# Patient Record
Sex: Male | Born: 1940 | Race: White | Hispanic: No | Marital: Married | Smoking: Former smoker
Health system: Southern US, Community
[De-identification: ages and names within clinical notes are randomized; demographics above are authoritative.]

## PROBLEM LIST (undated history)

## (undated) DIAGNOSIS — I499 Cardiac arrhythmia, unspecified: Secondary | ICD-10-CM

## (undated) DIAGNOSIS — K559 Vascular disorder of intestine, unspecified: Secondary | ICD-10-CM

## (undated) DIAGNOSIS — F419 Anxiety disorder, unspecified: Secondary | ICD-10-CM

## (undated) DIAGNOSIS — I509 Heart failure, unspecified: Secondary | ICD-10-CM

## (undated) DIAGNOSIS — I10 Essential (primary) hypertension: Secondary | ICD-10-CM

## (undated) DIAGNOSIS — F40240 Claustrophobia: Secondary | ICD-10-CM

## (undated) DIAGNOSIS — I82409 Acute embolism and thrombosis of unspecified deep veins of unspecified lower extremity: Secondary | ICD-10-CM

## (undated) HISTORY — PX: PACEMAKER INSERTION: SHX728

---

## 2000-11-04 HISTORY — PX: PITUITARY SURGERY: SHX203

## 2004-03-09 ENCOUNTER — Other Ambulatory Visit: Payer: Self-pay

## 2005-04-04 ENCOUNTER — Ambulatory Visit: Payer: Self-pay | Admitting: Gastroenterology

## 2005-11-04 HISTORY — PX: HERNIA REPAIR: SHX51

## 2008-11-04 DIAGNOSIS — K559 Vascular disorder of intestine, unspecified: Secondary | ICD-10-CM

## 2008-11-04 DIAGNOSIS — I82409 Acute embolism and thrombosis of unspecified deep veins of unspecified lower extremity: Secondary | ICD-10-CM

## 2008-11-04 HISTORY — DX: Acute embolism and thrombosis of unspecified deep veins of unspecified lower extremity: I82.409

## 2008-11-04 HISTORY — DX: Vascular disorder of intestine, unspecified: K55.9

## 2010-10-17 ENCOUNTER — Inpatient Hospital Stay: Payer: Self-pay | Admitting: Surgery

## 2010-10-19 LAB — PATHOLOGY REPORT

## 2010-11-04 ENCOUNTER — Ambulatory Visit: Payer: Self-pay | Admitting: Internal Medicine

## 2010-11-19 ENCOUNTER — Inpatient Hospital Stay: Payer: Self-pay | Admitting: Internal Medicine

## 2010-12-05 ENCOUNTER — Ambulatory Visit: Payer: Self-pay | Admitting: Internal Medicine

## 2010-12-15 ENCOUNTER — Observation Stay: Payer: Self-pay | Admitting: Internal Medicine

## 2011-02-25 ENCOUNTER — Ambulatory Visit: Payer: Self-pay | Admitting: Cardiovascular Disease

## 2011-03-01 ENCOUNTER — Ambulatory Visit: Payer: Self-pay | Admitting: Cardiovascular Disease

## 2011-04-16 ENCOUNTER — Ambulatory Visit: Payer: Self-pay | Admitting: Cardiology

## 2011-09-02 ENCOUNTER — Inpatient Hospital Stay: Payer: Self-pay | Admitting: Cardiology

## 2012-04-07 ENCOUNTER — Emergency Department: Payer: Self-pay | Admitting: Emergency Medicine

## 2012-04-07 LAB — COMPREHENSIVE METABOLIC PANEL
Albumin: 3.6 g/dL (ref 3.4–5.0)
Alkaline Phosphatase: 78 U/L (ref 50–136)
Anion Gap: 10 (ref 7–16)
BUN: 28 mg/dL — ABNORMAL HIGH (ref 7–18)
Calcium, Total: 8.6 mg/dL (ref 8.5–10.1)
Co2: 27 mmol/L (ref 21–32)
Creatinine: 1.16 mg/dL (ref 0.60–1.30)
EGFR (African American): >60
EGFR (Non-African Amer.): >60
Osmolality: 289 (ref 275–301)
Potassium: 3.7 mmol/L (ref 3.5–5.1)
SGOT(AST): 29 U/L (ref 15–37)
Total Protein: 6.9 g/dL (ref 6.4–8.2)

## 2012-04-07 LAB — URINALYSIS, COMPLETE
Bilirubin,UR: NEGATIVE
Glucose,UR: NEGATIVE mg/dL (ref 0–75)
Ketone: NEGATIVE
Nitrite: NEGATIVE
Ph: 6 (ref 4.5–8.0)
Protein: NEGATIVE
RBC,UR: 7 /HPF (ref 0–5)
WBC UR: <1 /HPF (ref 0–5)

## 2012-04-07 LAB — CBC
HCT: 45.7 % (ref 40.0–52.0)
HGB: 14.7 g/dL (ref 13.0–18.0)
Platelet: 201 10*3/uL (ref 150–440)
RBC: 5.52 10*6/uL (ref 4.40–5.90)
RDW: 17 % — ABNORMAL HIGH (ref 11.5–14.5)

## 2012-04-07 LAB — TROPONIN I: Troponin-I: <0.02 ng/mL

## 2012-04-07 LAB — CK TOTAL AND CKMB (NOT AT ARMC): CK-MB: 2.5 ng/mL (ref 0.5–3.6)

## 2012-07-10 DIAGNOSIS — N401 Enlarged prostate with lower urinary tract symptoms: Secondary | ICD-10-CM | POA: Diagnosis present

## 2012-11-05 ENCOUNTER — Ambulatory Visit: Payer: Self-pay | Admitting: Radiation Oncology

## 2014-03-03 DIAGNOSIS — I4891 Unspecified atrial fibrillation: Secondary | ICD-10-CM | POA: Diagnosis present

## 2014-03-03 DIAGNOSIS — I480 Paroxysmal atrial fibrillation: Secondary | ICD-10-CM | POA: Diagnosis present

## 2014-05-17 DIAGNOSIS — Z95810 Presence of automatic (implantable) cardiac defibrillator: Secondary | ICD-10-CM | POA: Diagnosis present

## 2014-05-17 DIAGNOSIS — I10 Essential (primary) hypertension: Secondary | ICD-10-CM | POA: Diagnosis present

## 2015-01-07 ENCOUNTER — Observation Stay: Payer: Self-pay | Admitting: Internal Medicine
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
## 2015-09-08 ENCOUNTER — Emergency Department
Admission: EM | Admit: 2015-09-08 | Discharge: 2015-09-08 | Disposition: A | Discharge status: Home / Self Care | Payer: Medicare PPO | Attending: Emergency Medicine | Admitting: Emergency Medicine

## 2015-09-08 ENCOUNTER — Emergency Department: Payer: Medicare PPO

## 2015-09-08 DIAGNOSIS — Z7901 Long term (current) use of anticoagulants: Secondary | ICD-10-CM | POA: Insufficient documentation

## 2015-09-08 DIAGNOSIS — R55 Syncope and collapse: Secondary | ICD-10-CM | POA: Insufficient documentation

## 2015-09-08 DIAGNOSIS — Z79899 Other long term (current) drug therapy: Secondary | ICD-10-CM | POA: Diagnosis not present

## 2015-09-08 DIAGNOSIS — I10 Essential (primary) hypertension: Secondary | ICD-10-CM | POA: Diagnosis not present

## 2015-09-08 HISTORY — DX: Acute embolism and thrombosis of unspecified deep veins of unspecified lower extremity: I82.409

## 2015-09-08 HISTORY — DX: Essential (primary) hypertension: I10

## 2015-09-08 HISTORY — DX: Heart failure, unspecified: I50.9

## 2015-09-08 LAB — COMPREHENSIVE METABOLIC PANEL
ALT: 18 U/L (ref 17–63)
AST: 23 U/L (ref 15–41)
Albumin: 3.5 g/dL (ref 3.5–5.0)
Alkaline Phosphatase: 54 U/L (ref 38–126)
Anion gap: 8 (ref 5–15)
BILIRUBIN TOTAL: 1.9 mg/dL — AB (ref 0.3–1.2)
BUN: 33 mg/dL — AB (ref 6–20)
CO2: 37 mmol/L — ABNORMAL HIGH (ref 22–32)
CREATININE: 1.29 mg/dL — AB (ref 0.61–1.24)
Calcium: 8.9 mg/dL (ref 8.9–10.3)
Chloride: 92 mmol/L — ABNORMAL LOW (ref 101–111)
GFR calc Af Amer: >60 mL/min (ref >60)
GFR calc non Af Amer: 54 mL/min — ABNORMAL LOW (ref >60)
Glucose, Bld: 209 mg/dL — ABNORMAL HIGH (ref 65–99)
Potassium: 2.7 mmol/L — CL (ref 3.5–5.1)
Sodium: 137 mmol/L (ref 135–145)
Total Protein: 6 g/dL — ABNORMAL LOW (ref 6.5–8.1)

## 2015-09-08 LAB — CBC WITH DIFFERENTIAL/PLATELET
BASOS ABS: 0.1 10*3/uL (ref 0–0.1)
Basophils Relative: 1 %
Eosinophils Absolute: 0.2 10*3/uL (ref 0–0.7)
Eosinophils Relative: 2 %
HEMATOCRIT: 48.7 % (ref 40.0–52.0)
HEMOGLOBIN: 16.5 g/dL (ref 13.0–18.0)
LYMPHS ABS: 1.7 10*3/uL (ref 1.0–3.6)
LYMPHS PCT: 20 %
MCH: 29.2 pg (ref 26.0–34.0)
MCHC: 33.9 g/dL (ref 32.0–36.0)
MCV: 86.3 fL (ref 80.0–100.0)
Monocytes Absolute: 1.1 10*3/uL — ABNORMAL HIGH (ref 0.2–1.0)
Monocytes Relative: 14 %
NEUTROS ABS: 5.2 10*3/uL (ref 1.4–6.5)
Neutrophils Relative %: 63 %
Platelets: 181 10*3/uL (ref 150–440)
RBC: 5.64 MIL/uL (ref 4.40–5.90)
RDW: 16.4 % — ABNORMAL HIGH (ref 11.5–14.5)
WBC: 8.3 10*3/uL (ref 3.8–10.6)

## 2015-09-08 LAB — TROPONIN I: TROPONIN I: 0.04 ng/mL — AB (ref <0.031)

## 2015-09-08 LAB — FIBRIN DERIVATIVES D-DIMER (ARMC ONLY): FIBRIN DERIVATIVES D-DIMER (ARMC): 679 — AB (ref 0–499)

## 2015-09-08 MED ORDER — POTASSIUM CHLORIDE CRYS ER 20 MEQ PO TBCR
20.0000 meq | EXTENDED_RELEASE_TABLET | Freq: Once | ORAL | Status: AC
  Administered 2015-09-08: 20 meq via ORAL
  Filled 2015-09-08: qty 1

## 2015-09-08 MED ORDER — IOHEXOL 350 MG/ML SOLN
80.0000 mL | Freq: Once | INTRAVENOUS | Status: AC | PRN
  Administered 2015-09-08: 80 mL via INTRAVENOUS

## 2015-09-08 NOTE — ED Notes (Signed)
MD notified of criticals
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
## 2015-09-08 NOTE — Discharge Instructions (Signed)
Near-Syncope °Near-syncope (commonly known as near fainting) is sudden weakness, dizziness, or feeling like you might pass out. During an episode of near-syncope, you may also develop pale skin, have tunnel vision, or feel sick to your stomach (nauseous). Near-syncope may occur when getting up after sitting or while standing for a long time. It is caused by a sudden decrease in blood flow to the brain. This decrease can result from various causes or triggers, most of which are not serious. However, because near-syncope can sometimes be a sign of something serious, a medical evaluation is required. The specific cause is often not determined. °HOME CARE INSTRUCTIONS  °Monitor your condition for any changes. The following actions may help to alleviate any discomfort you are experiencing: °· Have someone stay with you until you feel stable. °· Lie down right away and prop your feet up if you start feeling like you might faint. Breathe deeply and steadily. Wait until all the symptoms have passed. Most of these episodes last only a few minutes. You may feel tired for several hours.   °· Drink enough fluids to keep your urine clear or pale yellow.   °· If you are taking blood pressure or heart medicine, get up slowly when seated or lying down. Take several minutes to sit and then stand. This can reduce dizziness. °· Follow up with your health care provider as directed.  °SEEK IMMEDIATE MEDICAL CARE IF:  °· You have a severe headache.   °· You have unusual pain in the chest, abdomen, or back.   °· You are bleeding from the mouth or rectum, or you have black or tarry stool.   °· You have an irregular or very fast heartbeat.   °· You have repeated fainting or have seizure-like jerking during an episode.   °· You faint when sitting or lying down.   °· You have confusion.   °· You have difficulty walking.   °· You have severe weakness.   °· You have vision problems.   °MAKE SURE YOU:  °· Understand these instructions. °· Will  watch your condition. °· Will get help right away if you are not doing well or get worse. °  °This information is not intended to replace advice given to you by your health care provider. Make sure you discuss any questions you have with your health care provider. °  °Document Released: 10/21/2005 Document Revised: 10/26/2013 Document Reviewed: 03/26/2013 °Elsevier Interactive Patient Education ©2016 [employer] ° °Please return immediately if condition worsens. Please contact her primary physician or the physician you were given for referral. If you have any specialist physicians involved in her treatment and plan please also contact them. Thank you for using [facility]. ° °

## 2015-09-08 NOTE — ED Provider Notes (Signed)
Time Seen: Approximately 1042  I have reviewed the triage notes  Chief Complaint: Near Syncope   History of Present Illness: Adrian Chavez is a 73 y.o. male who states he is working out with a client at the gym and apparently had what he describes as a near syncopal episode. Patient has a history of blood clots as recently had his PT/INR time checked 2 days ago and it was therapeutic at 2.0. Patient denies any chest pain or focal weakness. He states currently he feels back to baseline. Bystanders said that he had loss consciousness but the patient states he "" phased out "". He denies any leg pain or swelling, shortness of breath, nausea or vomiting, arm and jaw or neck discomfort. Patient states he's been taking slightly extra of his diuretic medication. He is also been finishing up with a prednisone taper   Past Medical History  Diagnosis Date  . CHF (congestive heart failure) ([place])   . DVT (deep venous thrombosis) ([place])   . Hypertension     There are no active problems to display for this patient.   Past Surgical History  Procedure Laterality Date  . Pacemaker insertion      Past Surgical History  Procedure Laterality Date  . Pacemaker insertion      Current Outpatient Rx  Name  Route  Sig  Dispense  Refill  . ALPRAZolam (XANAX) 0.5 MG tablet   Oral   Take 0.5 mg by mouth daily as needed for anxiety.          . carvedilol (COREG) 3.125 MG tablet   Oral   Take 3.125 mg by mouth 2 (two) times daily.         Marland Kitchen CIALIS 5 MG tablet   Oral   Take 2.5 mg by mouth daily.           Dispense as written.   . metolazone (ZAROXOLYN) 5 MG tablet   Oral   Take 5 mg by mouth daily as needed (for swelling).         . potassium chloride SA (K-DUR,KLOR-CON) 20 MEQ tablet   Oral   Take 20 mEq by mouth 2 (two) times daily.         . predniSONE (DELTASONE) 10 MG tablet   Oral   Take 10-40 mg by mouth See admin instructions. 4 tablets daily for 3 days, then 3 tablets  daily for 3 days, then 2 tablets daily for 3 days, then 1 tablet daily for 3 days, then stop         . torsemide (DEMADEX) 20 MG tablet   Oral   Take 20 mg by mouth 2 (two) times daily.         Marland Kitchen warfarin (COUMADIN) 1 MG tablet   Oral   Take 2 mg by mouth at bedtime.           Allergies:  Oxycodone  Family History: No family history on file.  Social History: Social History  Substance Use Topics  . Smoking status: Never Smoker   . Smokeless tobacco: None  . Alcohol Use: No     Review of Systems:   10 point review of systems was performed and was otherwise negative:  Constitutional: No fever Eyes: No visual disturbances ENT: No sore throat, ear pain Cardiac: No chest pain Respiratory: No shortness of breath, wheezing, or stridor Abdomen: No abdominal pain, no vomiting, No diarrhea Endocrine: No weight loss, No night sweats Extremities: No peripheral  edema, cyanosis Skin: No rashes, easy bruising Neurologic: No focal weakness, trouble with speech or swollowing Urologic: No dysuria, Hematuria, or urinary frequency   Physical Exam:  ED Triage Vitals  Enc Vitals Group     BP 09/08/15 1039 98/72 mmHg     Pulse Rate 09/08/15 1039 80     Resp 09/08/15 1039 16     Temp 09/08/15 1039 97.6 F (36.4 C)     Temp Source 09/08/15 1039 Oral     SpO2 09/08/15 1039 97 %     Weight 09/08/15 1039 173 lb (78.472 kg)     Height 09/08/15 1039 5\' 8"  (1.727 m)     Head Cir --      Peak Flow --      Pain Score --      Pain Loc --      Pain Edu? --      Excl. in [place]? --     General: Awake , Alert , and Oriented times 3; GCS 15 Head: Normal cephalic , atraumatic Eyes: Pupils equal , round, reactive to light Nose/Throat: No nasal drainage, patent upper airway without erythema or exudate.  Neck: Supple, Full range of motion, No anterior adenopathy or palpable thyroid masses Lungs: Clear to ascultation without wheezes , rhonchi, or rales Heart: Regular rate, regular rhythm  without murmurs , gallops , or rubs Abdomen: Soft, non tender without rebound, guarding , or rigidity; bowel sounds positive and symmetric in all 4 quadrants. No organomegaly .        Extremities: 2 plus symmetric pulses. No edema, clubbing or cyanosis Neurologic: normal ambulation, Motor symmetric without deficits, sensory intact Skin: warm, dry, no rashes   Labs:   All laboratory work was reviewed including any pertinent negatives or positives listed below:  Labs Reviewed  CBC WITH DIFFERENTIAL/PLATELET - Abnormal; Notable for the following:    RDW 16.4 (*)    Monocytes Absolute 1.1 (*)    All other components within normal limits  COMPREHENSIVE METABOLIC PANEL - Abnormal; Notable for the following:    Potassium 2.7 (*)    Chloride 92 (*)    CO2 37 (*)    Glucose, Bld 209 (*)    BUN 33 (*)    Creatinine, Ser 1.29 (*)    Total Protein 6.0 (*)    Total Bilirubin 1.9 (*)    GFR calc non Af Amer 54 (*)    All other components within normal limits  TROPONIN I - Abnormal; Notable for the following:    Troponin I 0.04 (*)    All other components within normal limits  FIBRIN DERIVATIVES D-DIMER ([facility] ONLY) - Abnormal; Notable for the following:    Fibrin derivatives D-dimer ([facility]) 679 (*)    All other components within normal limits   laboratory work shows slightly elevated D-dimer test, troponin is overall within normal limits. Patient also has hypokalemia. He has some mild renal insufficiency for him.  EKG: ED ECG REPORT I, Daymon Larsen, the attending physician, personally viewed and interpreted this ECG.  Date: 09/08/2015 EKG Time: 1037 Rate: 76 Rhythm: Paced with frequent PVCs QRS Axis: normal Intervals: normal ST/T Wave abnormalities: normal Conduction Disutrbances: none Narrative Interpretation: unremarkable Unremarkable given history of pacemaker  Radiology:      EXAM: CT ANGIOGRAPHY CHEST WITH CONTRAST  TECHNIQUE: Multidetector CT imaging of the chest was  performed using the standard protocol during bolus administration of intravenous contrast. Multiplanar CT image reconstructions and MIPs were obtained to evaluate  the vascular anatomy.  CONTRAST: 67mL OMNIPAQUE IOHEXOL 350 MG/ML IV.  COMPARISON: 11/19/2010.  FINDINGS: Technical quality: Very good.  Pulmonary embolism: Absent.  Cardiovascular: Heart enlarged with marked left ventricular enlargement and marked left atrial enlargement. Biventricular pacing defibrillator with the lead tips at the right atrial appendage, right ventricular apex and left coronary vein. Severe LAD and right coronary atherosclerosis. No pericardial effusion. Moderate atherosclerosis involving the thoracic and upper abdominal aorta without aneurysm.  Mediastinum/Lymph Nodes: No pathologically enlarged mediastinal, hilar or axillary lymph nodes. No mediastinal masses. Normal-appearing esophagus.  Lungs/Pleura: Emphysematous changes throughout both lungs. Calcified granulomata throughout both lungs. Noncalcified pleural-based 4 mm nodule in the lateral left upper lobe (series 6, images 57), not visualized on the prior examination. No noncalcified nodules elsewhere. Scarring in the lower lobes. Bronchiectasis involving the lower lobes, right greater than left. No confluent airspace consolidation. No evidence of interstitial pulmonary edema. Central airways patent with mild bronchial wall thickening. No pleural effusions. No pleural masses. He  Upper abdomen: Simple cyst involving the left lobe of the liver. Focus of accessory splenic tissue medial to the lower pole of the spleen. No acute or significant abnormality involving the visualized upper abdomen.  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 Exaggeration of the usual thoracic kyphosis.  Review of the MIP images confirms the above findings.  IMPRESSION: 1. No evidence of pulmonary  embolism. 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 3. 4 mm pleural-based nodule laterally in the left upper lobe, not clearly visualized in 2012. Statistically this represents a subpleural lymph node. Given risk factors for bronchogenic carcinoma, follow-up chest CT at 1 year is recommended. This recommendation follows the consensus statement: Guidelines for Management of Small Pulmonary Nodules Detected on CT Scans: A Statement from the [place] as published in Radiology 2005; 237:395-400. 4. Bronchiectasis involving the lower lobes, right greater than left. 5. Marked cardiomegaly with marked left ventricular enlargement and left atrial enlargement. No evidence of pulmonary edema.   Electronically Signed By: Evangeline Dakin M.D. On: 09/08/2015 14:29          DG Chest 2 View (Final result) Result time: 09/08/15 11:20:03   Final result by Rad Results In Interface (09/08/15 11:20:03)   Narrative:   CLINICAL DATA: Weakness, dizziness and near syncope. Diaphoretic.  EXAM: CHEST - 2 VIEW  COMPARISON: 01/07/2015  FINDINGS: Stable significant cardiac enlargement and stable appearance of a biventricular pacing/ICD device. There is no evidence of pulmonary edema, consolidation, pneumothorax, nodule or pleural fluid. The thoracic spine demonstrates stable spondylosis and osteopenia.  IMPRESSION: Stable significant cardiomegaly and radiographic appearance of biventricular pacing/ICD device. No pulmonary edema identified.   Electronically Signed By: Aletta Edouard M.D. On: 09/08/2015 11:20             I personally reviewed the radiologic studies    ED Course:  Patient was placed on a continuous cardiac monitor and otherwise felt fine at this point. I'm not sure the exact cause of his syncopal or near syncopal episode however does not appear to be obviously cardiogenic at this time. Patient  states he feels symptomatically improved and wishes to go home. He was advised to avoid his diuretic therapy over at least the next 24 hours. Skin advised drink plenty of fluids and was given food and fluid here in emergency department without difficulty    Assessment:  Near syncope   Final Clinical Impression:  Final diagnoses:  Near syncope  Plan:  Patient was advised to return immediately if condition worsens. Patient was advised to follow up with her primary care physician or other specialized physicians involved and in their current assessment.             Daymon Larsen, MD 09/08/15 [phone]

## 2015-09-08 NOTE — ED Notes (Signed)
Pt informed to return if any life threatening symptoms occur.  Pt alert and oriented X4, active, cooperative, pt in NAD. RR even and unlabored, color WNL.   

## 2015-09-08 NOTE — ED Notes (Signed)
Pt color in skin now appropriate, skin warm and dry.

## 2015-11-27 DIAGNOSIS — M9901 Segmental and somatic dysfunction of cervical region: Secondary | ICD-10-CM | POA: Diagnosis not present

## 2015-11-27 DIAGNOSIS — M50322 Other cervical disc degeneration at C5-C6 level: Secondary | ICD-10-CM | POA: Diagnosis not present

## 2015-11-27 DIAGNOSIS — M50321 Other cervical disc degeneration at C4-C5 level: Secondary | ICD-10-CM | POA: Diagnosis not present

## 2015-11-27 DIAGNOSIS — M50323 Other cervical disc degeneration at C6-C7 level: Secondary | ICD-10-CM | POA: Diagnosis not present

## 2015-11-30 DIAGNOSIS — I480 Paroxysmal atrial fibrillation: Secondary | ICD-10-CM | POA: Diagnosis not present

## 2015-11-30 DIAGNOSIS — M50321 Other cervical disc degeneration at C4-C5 level: Secondary | ICD-10-CM | POA: Diagnosis not present

## 2015-11-30 DIAGNOSIS — M50323 Other cervical disc degeneration at C6-C7 level: Secondary | ICD-10-CM | POA: Diagnosis not present

## 2015-11-30 DIAGNOSIS — M9901 Segmental and somatic dysfunction of cervical region: Secondary | ICD-10-CM | POA: Diagnosis not present

## 2015-11-30 DIAGNOSIS — M50322 Other cervical disc degeneration at C5-C6 level: Secondary | ICD-10-CM | POA: Diagnosis not present

## 2015-12-01 DIAGNOSIS — M542 Cervicalgia: Secondary | ICD-10-CM | POA: Diagnosis not present

## 2015-12-07 DIAGNOSIS — Z79899 Other long term (current) drug therapy: Secondary | ICD-10-CM | POA: Diagnosis not present

## 2015-12-14 DIAGNOSIS — Z8739 Personal history of other diseases of the musculoskeletal system and connective tissue: Secondary | ICD-10-CM | POA: Diagnosis not present

## 2015-12-14 DIAGNOSIS — I5022 Chronic systolic (congestive) heart failure: Secondary | ICD-10-CM | POA: Diagnosis not present

## 2015-12-14 DIAGNOSIS — I4891 Unspecified atrial fibrillation: Secondary | ICD-10-CM | POA: Diagnosis not present

## 2015-12-14 DIAGNOSIS — I482 Chronic atrial fibrillation: Secondary | ICD-10-CM | POA: Diagnosis not present

## 2015-12-14 DIAGNOSIS — I10 Essential (primary) hypertension: Secondary | ICD-10-CM | POA: Diagnosis not present

## 2015-12-20 DIAGNOSIS — I5022 Chronic systolic (congestive) heart failure: Secondary | ICD-10-CM | POA: Diagnosis not present

## 2015-12-20 DIAGNOSIS — Z95810 Presence of automatic (implantable) cardiac defibrillator: Secondary | ICD-10-CM | POA: Diagnosis not present

## 2015-12-20 DIAGNOSIS — I420 Dilated cardiomyopathy: Secondary | ICD-10-CM | POA: Diagnosis not present

## 2015-12-20 DIAGNOSIS — I480 Paroxysmal atrial fibrillation: Secondary | ICD-10-CM | POA: Diagnosis not present

## 2015-12-20 DIAGNOSIS — I10 Essential (primary) hypertension: Secondary | ICD-10-CM | POA: Diagnosis not present

## 2016-01-04 DIAGNOSIS — M791 Myalgia: Secondary | ICD-10-CM | POA: Diagnosis not present

## 2016-01-04 DIAGNOSIS — R972 Elevated prostate specific antigen [PSA]: Secondary | ICD-10-CM | POA: Diagnosis not present

## 2016-01-11 DIAGNOSIS — I4891 Unspecified atrial fibrillation: Secondary | ICD-10-CM | POA: Diagnosis not present

## 2016-01-16 DIAGNOSIS — I482 Chronic atrial fibrillation: Secondary | ICD-10-CM | POA: Diagnosis not present

## 2016-01-17 DIAGNOSIS — M1A00X0 Idiopathic chronic gout, unspecified site, without tophus (tophi): Secondary | ICD-10-CM | POA: Diagnosis not present

## 2016-01-17 DIAGNOSIS — R700 Elevated erythrocyte sedimentation rate: Secondary | ICD-10-CM | POA: Diagnosis not present

## 2016-01-17 DIAGNOSIS — M25511 Pain in right shoulder: Secondary | ICD-10-CM | POA: Diagnosis not present

## 2016-01-17 DIAGNOSIS — G8929 Other chronic pain: Secondary | ICD-10-CM | POA: Diagnosis not present

## 2016-01-17 DIAGNOSIS — M353 Polymyalgia rheumatica: Secondary | ICD-10-CM | POA: Diagnosis not present

## 2016-01-17 DIAGNOSIS — M25551 Pain in right hip: Secondary | ICD-10-CM | POA: Diagnosis not present

## 2016-01-17 DIAGNOSIS — M25512 Pain in left shoulder: Secondary | ICD-10-CM | POA: Diagnosis not present

## 2016-01-29 DIAGNOSIS — I10 Essential (primary) hypertension: Secondary | ICD-10-CM | POA: Diagnosis not present

## 2016-01-29 DIAGNOSIS — I5022 Chronic systolic (congestive) heart failure: Secondary | ICD-10-CM | POA: Diagnosis not present

## 2016-01-29 DIAGNOSIS — Z95810 Presence of automatic (implantable) cardiac defibrillator: Secondary | ICD-10-CM | POA: Diagnosis not present

## 2016-01-29 DIAGNOSIS — I420 Dilated cardiomyopathy: Secondary | ICD-10-CM | POA: Diagnosis not present

## 2016-01-29 DIAGNOSIS — I480 Paroxysmal atrial fibrillation: Secondary | ICD-10-CM | POA: Diagnosis not present

## 2016-01-31 DIAGNOSIS — R700 Elevated erythrocyte sedimentation rate: Secondary | ICD-10-CM | POA: Diagnosis not present

## 2016-01-31 DIAGNOSIS — M25551 Pain in right hip: Secondary | ICD-10-CM | POA: Diagnosis not present

## 2016-01-31 DIAGNOSIS — M25512 Pain in left shoulder: Secondary | ICD-10-CM | POA: Diagnosis not present

## 2016-01-31 DIAGNOSIS — G8929 Other chronic pain: Secondary | ICD-10-CM | POA: Diagnosis not present

## 2016-01-31 DIAGNOSIS — M25511 Pain in right shoulder: Secondary | ICD-10-CM | POA: Diagnosis not present

## 2016-02-08 DIAGNOSIS — I4891 Unspecified atrial fibrillation: Secondary | ICD-10-CM | POA: Diagnosis not present

## 2016-02-08 DIAGNOSIS — I480 Paroxysmal atrial fibrillation: Secondary | ICD-10-CM | POA: Diagnosis not present

## 2016-02-14 ENCOUNTER — Ambulatory Visit
Admission: RE | Admit: 2016-02-14 | Discharge: 2016-02-14 | Disposition: A | Discharge status: Home / Self Care | Payer: PPO | Source: Ambulatory Visit | Attending: Cardiology | Admitting: Cardiology

## 2016-02-14 ENCOUNTER — Ambulatory Visit: Admission: RE | Admit: 2016-02-14 | Payer: PPO | Source: Ambulatory Visit

## 2016-02-14 ENCOUNTER — Encounter
Admission: RE | Admit: 2016-02-14 | Discharge: 2016-02-14 | Disposition: A | Discharge status: Home / Self Care | Payer: PPO | Source: Ambulatory Visit | Attending: Cardiology | Admitting: Cardiology

## 2016-02-14 DIAGNOSIS — I509 Heart failure, unspecified: Secondary | ICD-10-CM

## 2016-02-14 DIAGNOSIS — I517 Cardiomegaly: Secondary | ICD-10-CM | POA: Diagnosis not present

## 2016-02-14 DIAGNOSIS — Z01818 Encounter for other preprocedural examination: Secondary | ICD-10-CM | POA: Diagnosis not present

## 2016-02-14 DIAGNOSIS — Z95810 Presence of automatic (implantable) cardiac defibrillator: Secondary | ICD-10-CM | POA: Diagnosis not present

## 2016-02-14 DIAGNOSIS — I10 Essential (primary) hypertension: Secondary | ICD-10-CM | POA: Insufficient documentation

## 2016-02-14 DIAGNOSIS — I4891 Unspecified atrial fibrillation: Secondary | ICD-10-CM | POA: Insufficient documentation

## 2016-02-14 HISTORY — DX: Vascular disorder of intestine, unspecified: K55.9

## 2016-02-14 HISTORY — DX: Anxiety disorder, unspecified: F41.9

## 2016-02-14 HISTORY — DX: Cardiac arrhythmia, unspecified: I49.9

## 2016-02-14 HISTORY — DX: Claustrophobia: F40.240

## 2016-02-14 LAB — CBC
HCT: 45.3 % (ref 40.0–52.0)
HEMOGLOBIN: 15.3 g/dL (ref 13.0–18.0)
MCH: 28.9 pg (ref 26.0–34.0)
MCHC: 33.8 g/dL (ref 32.0–36.0)
MCV: 85.5 fL (ref 80.0–100.0)
Platelets: 176 10*3/uL (ref 150–440)
RBC: 5.3 MIL/uL (ref 4.40–5.90)
RDW: 16.7 % — ABNORMAL HIGH (ref 11.5–14.5)
WBC: 6.9 10*3/uL (ref 3.8–10.6)

## 2016-02-14 LAB — BASIC METABOLIC PANEL
ANION GAP: 5 (ref 5–15)
BUN: 24 mg/dL — ABNORMAL HIGH (ref 6–20)
CALCIUM: 9.3 mg/dL (ref 8.9–10.3)
CO2: 31 mmol/L (ref 22–32)
Chloride: 102 mmol/L (ref 101–111)
Creatinine, Ser: 1.03 mg/dL (ref 0.61–1.24)
Glucose, Bld: 100 mg/dL — ABNORMAL HIGH (ref 65–99)
POTASSIUM: 3.3 mmol/L — AB (ref 3.5–5.1)
SODIUM: 138 mmol/L (ref 135–145)

## 2016-02-14 LAB — SURGICAL PCR SCREEN
MRSA, PCR: NEGATIVE
Staphylococcus aureus: POSITIVE — AB

## 2016-02-14 LAB — DIFFERENTIAL
BASOS ABS: 0 10*3/uL (ref 0–0.1)
BASOS PCT: 1 %
Eosinophils Absolute: 0.1 10*3/uL (ref 0–0.7)
Eosinophils Relative: 2 %
Lymphocytes Relative: 13 %
Lymphs Abs: 0.9 10*3/uL — ABNORMAL LOW (ref 1.0–3.6)
Monocytes Absolute: 0.7 10*3/uL (ref 0.2–1.0)
Monocytes Relative: 11 %
NEUTROS ABS: 5 10*3/uL (ref 1.4–6.5)
NEUTROS PCT: 73 %

## 2016-02-14 LAB — PROTIME-INR
INR: 1.35
PROTHROMBIN TIME: 16.8 s — AB (ref 11.4–15.0)

## 2016-02-14 LAB — APTT: APTT: 28 s (ref 24–36)

## 2016-02-14 NOTE — Pre-Procedure Instructions (Signed)
Spoke with Gwinda Passe at Dr. Saralyn Pilar office and she did confirm the lab results that were faxed a few minutes ago have been received and will be shown to Dr. Saralyn Pilar for review.

## 2016-02-14 NOTE — Patient Instructions (Signed)
  Your procedure is scheduled on:Wednesday April 19 , 2017. Report to Same Day Surgery. To find out your arrival time please call [phone] between 1PM - 3PM on Tuesday February 20, 2016.  Remember: Instructions that are not followed completely may result in serious medical risk, up to and including death, or upon the discretion of your surgeon and anesthesiologist your surgery may need to be rescheduled.    _x___ 1. Do not eat food or drink liquids after midnight. No gum chewing or hard candies.     ____ 2. No Alcohol for 24 hours before or after surgery.   ____ 3. Bring all medications with you on the day of surgery if instructed.    __x__ 4. Notify your doctor if there is any change in your medical condition     (cold, fever, infections).     Do not wear jewelry, make-up, hairpins, clips or nail polish.  Do not wear lotions, powders, or perfumes. You may wear deodorant.  Do not shave 48 hours prior to surgery. Men may shave face and neck.  Do not bring valuables to the hospital.    [facility] is not responsible for any belongings or valuables.               Contacts, dentures or bridgework may not be worn into surgery.  Leave your suitcase in the car. After surgery it may be brought to your room.  For patients admitted to the hospital, discharge time is determined by your treatment team.   Patients discharged the day of surgery will not be allowed to drive home.    Please read over the following fact sheets that you were given:   [facility] Preparing for Surgery  __x__ Take these medicines the morning of surgery with A SIP OF WATER:    1. carvedilol (COREG)  2. predniSONE (DELTASONE)    ____ Fleet Enema (as directed)   __x__ Use CHG Soap as directed on instruction sheet  ____ Use inhalers on the day of surgery and bring to hospital day of surgery  ____ Stop metformin 2 days prior to surgery    ____ Take 1/2 of usual insulin dose the night before surgery and none on  the morning of surgery.   ____ Stop Coumadin on February 15, 2016 per Dr. Saralyn Pilar instructions.  _x___ Stop Anti-inflammatories such as Advil, Aleve, Ibuprofen, Motrin, Naproxen, Naprosyn, Goodies powders or aspirin products.OK to take Tylenol.   ____ Stop supplements until after surgery.    ____ Bring C-Pap to the hospital.

## 2016-02-14 NOTE — Pre-Procedure Instructions (Signed)
Abnormal lab results: potassium, PT and positive Staph Aureus nasal swab faxed to Dr. Saralyn Pilar for review.

## 2016-02-15 DIAGNOSIS — I4891 Unspecified atrial fibrillation: Secondary | ICD-10-CM | POA: Diagnosis not present

## 2016-02-21 ENCOUNTER — Ambulatory Visit: Payer: PPO | Admitting: Anesthesiology

## 2016-02-21 ENCOUNTER — Encounter
Admission: RE | Disposition: A | Discharge status: Home / Self Care | Payer: Self-pay | Source: Ambulatory Visit | Attending: Cardiology

## 2016-02-21 ENCOUNTER — Ambulatory Visit
Admission: RE | Admit: 2016-02-21 | Discharge: 2016-02-21 | Disposition: A | Discharge status: Home / Self Care | Payer: PPO | Source: Ambulatory Visit | Attending: Cardiology | Admitting: Cardiology

## 2016-02-21 ENCOUNTER — Encounter: Payer: Self-pay | Admitting: *Deleted

## 2016-02-21 DIAGNOSIS — I5022 Chronic systolic (congestive) heart failure: Secondary | ICD-10-CM | POA: Diagnosis not present

## 2016-02-21 DIAGNOSIS — Z4502 Encounter for adjustment and management of automatic implantable cardiac defibrillator: Secondary | ICD-10-CM | POA: Diagnosis not present

## 2016-02-21 DIAGNOSIS — I420 Dilated cardiomyopathy: Secondary | ICD-10-CM | POA: Insufficient documentation

## 2016-02-21 DIAGNOSIS — I480 Paroxysmal atrial fibrillation: Secondary | ICD-10-CM | POA: Diagnosis not present

## 2016-02-21 DIAGNOSIS — N400 Enlarged prostate without lower urinary tract symptoms: Secondary | ICD-10-CM | POA: Insufficient documentation

## 2016-02-21 DIAGNOSIS — I340 Nonrheumatic mitral (valve) insufficiency: Secondary | ICD-10-CM | POA: Diagnosis not present

## 2016-02-21 DIAGNOSIS — Z45018 Encounter for adjustment and management of other part of cardiac pacemaker: Secondary | ICD-10-CM | POA: Diagnosis not present

## 2016-02-21 DIAGNOSIS — Z7901 Long term (current) use of anticoagulants: Secondary | ICD-10-CM | POA: Insufficient documentation

## 2016-02-21 DIAGNOSIS — I509 Heart failure, unspecified: Secondary | ICD-10-CM | POA: Diagnosis not present

## 2016-02-21 DIAGNOSIS — Z87891 Personal history of nicotine dependence: Secondary | ICD-10-CM | POA: Insufficient documentation

## 2016-02-21 DIAGNOSIS — F419 Anxiety disorder, unspecified: Secondary | ICD-10-CM | POA: Insufficient documentation

## 2016-02-21 DIAGNOSIS — I110 Hypertensive heart disease with heart failure: Secondary | ICD-10-CM | POA: Diagnosis not present

## 2016-02-21 DIAGNOSIS — R040 Epistaxis: Secondary | ICD-10-CM | POA: Diagnosis not present

## 2016-02-21 DIAGNOSIS — I10 Essential (primary) hypertension: Secondary | ICD-10-CM | POA: Diagnosis not present

## 2016-02-21 DIAGNOSIS — Z86711 Personal history of pulmonary embolism: Secondary | ICD-10-CM | POA: Insufficient documentation

## 2016-02-21 DIAGNOSIS — I429 Cardiomyopathy, unspecified: Secondary | ICD-10-CM | POA: Diagnosis not present

## 2016-02-21 DIAGNOSIS — I482 Chronic atrial fibrillation: Secondary | ICD-10-CM | POA: Diagnosis not present

## 2016-02-21 HISTORY — PX: IMPLANTABLE CARDIOVERTER DEFIBRILLATOR (ICD) GENERATOR CHANGE: SHX5469

## 2016-02-21 SURGERY — ICD GENERATOR CHANGE
Anesthesia: General | Site: Shoulder | Laterality: Left | Wound class: Clean

## 2016-02-21 MED ORDER — PROPOFOL 10 MG/ML IV BOLUS
INTRAVENOUS | Status: DC | PRN
  Administered 2016-02-21: 20 mg via INTRAVENOUS

## 2016-02-21 MED ORDER — FAMOTIDINE 20 MG PO TABS
ORAL_TABLET | ORAL | Status: AC
  Filled 2016-02-21: qty 1

## 2016-02-21 MED ORDER — SODIUM CHLORIDE 0.9 % IR SOLN
Freq: Once | Status: AC
  Administered 2016-02-21: 100 mL
  Filled 2016-02-21: qty 2

## 2016-02-21 MED ORDER — ONDANSETRON HCL 4 MG/2ML IJ SOLN: 4.0000 mg | Freq: Once | INTRAMUSCULAR | Status: DC | PRN

## 2016-02-21 MED ORDER — CEPHALEXIN 250 MG PO CAPS
250.0000 mg | ORAL_CAPSULE | Freq: Four times a day (QID) | ORAL | Status: DC
Start: 2016-02-21 — End: 2018-08-10

## 2016-02-21 MED ORDER — KETAMINE HCL 50 MG/ML IJ SOLN
INTRAMUSCULAR | Status: DC | PRN
Start: 2016-02-21 — End: 2016-02-21
  Administered 2016-02-21 (×2): 25 mg via INTRAMUSCULAR

## 2016-02-21 MED ORDER — FENTANYL CITRATE (PF) 100 MCG/2ML IJ SOLN
INTRAMUSCULAR | Status: DC | PRN
  Administered 2016-02-21: 50 ug via INTRAVENOUS

## 2016-02-21 MED ORDER — FAMOTIDINE 20 MG PO TABS
20.0000 mg | ORAL_TABLET | Freq: Once | ORAL | Status: AC
  Administered 2016-02-21: 20 mg via ORAL

## 2016-02-21 MED ORDER — LIDOCAINE 1 % OPTIME INJ - NO CHARGE
INTRAMUSCULAR | Status: DC | PRN
  Administered 2016-02-21: 20 mL

## 2016-02-21 MED ORDER — CEFAZOLIN SODIUM 1-5 GM-% IV SOLN
1.0000 g | Freq: Once | INTRAVENOUS | Status: AC
  Administered 2016-02-21: 1 g via INTRAVENOUS

## 2016-02-21 MED ORDER — FENTANYL CITRATE (PF) 100 MCG/2ML IJ SOLN: 25.0000 ug | INTRAMUSCULAR | Status: DC | PRN

## 2016-02-21 MED ORDER — PHENYLEPHRINE HCL 10 MG/ML IJ SOLN
INTRAMUSCULAR | Status: DC | PRN
  Administered 2016-02-21 (×6): 100 ug via INTRAVENOUS

## 2016-02-21 MED ORDER — CEFAZOLIN SODIUM 1-5 GM-% IV SOLN
INTRAVENOUS | Status: AC
  Filled 2016-02-21: qty 50

## 2016-02-21 MED ORDER — HEPARIN SODIUM (PORCINE) 5000 UNIT/ML IJ SOLN
INTRAMUSCULAR | Status: AC
  Filled 2016-02-21: qty 1

## 2016-02-21 MED ORDER — MIDAZOLAM HCL 2 MG/2ML IJ SOLN
INTRAMUSCULAR | Status: DC | PRN
  Administered 2016-02-21: 2 mg via INTRAVENOUS

## 2016-02-21 MED ORDER — LACTATED RINGERS IV SOLN
INTRAVENOUS | Status: DC
  Administered 2016-02-21: 11:00:00 via INTRAVENOUS

## 2016-02-21 MED ORDER — PROPOFOL 500 MG/50ML IV EMUL
INTRAVENOUS | Status: DC | PRN
  Administered 2016-02-21: 50 ug/kg/min via INTRAVENOUS

## 2016-02-21 MED ORDER — GENTAMICIN SULFATE 40 MG/ML IJ SOLN
INTRAMUSCULAR | Status: AC
  Filled 2016-02-21: qty 2

## 2016-02-21 SURGICAL SUPPLY — 46 items
AMPLIA MRI CRT-DFI (Pacemaker) ×3 IMPLANT
BAG DECANTER FOR FLEXI CONT (MISCELLANEOUS) ×6 IMPLANT
BLADE CLIPPER SURG (BLADE) ×3 IMPLANT
BLADE SURG SZ10 CARB STEEL (BLADE) ×3 IMPLANT
CABLE SURG 12 DISP A/V CHANNEL (MISCELLANEOUS) ×3 IMPLANT
CANISTER SUCT 1200ML W/VALVE (MISCELLANEOUS) ×3 IMPLANT
CHLORAPREP W/TINT 26ML (MISCELLANEOUS) ×3 IMPLANT
CLOSURE WOUND 1/2 X4 (GAUZE/BANDAGES/DRESSINGS) ×1
COVER LIGHT HANDLE STERIS (MISCELLANEOUS) ×6 IMPLANT
DEVICE DISSECT PLASMABLAD 3.0S (MISCELLANEOUS) ×1 IMPLANT
DRAPE C-ARM XRAY 36X54 (DRAPES) IMPLANT
DRAPE INCISE IOBAN 66X45 STRL (DRAPES) ×3 IMPLANT
DRAPE LAPAROTOMY 77X122 PED (DRAPES) ×3 IMPLANT
DRSG TEGADERM 4X4.75 (GAUZE/BANDAGES/DRESSINGS) ×3 IMPLANT
DRSG TEGADERM 6X8 (GAUZE/BANDAGES/DRESSINGS) ×3 IMPLANT
ELECT REM PT RETURN 9FT ADLT (ELECTROSURGICAL) ×3
ELECTRODE REM PT RTRN 9FT ADLT (ELECTROSURGICAL) ×1 IMPLANT
GLOVE BIO SURGEON STRL SZ8 (GLOVE) ×3 IMPLANT
GOWN STRL REUS W/ TWL LRG LVL3 (GOWN DISPOSABLE) ×1 IMPLANT
GOWN STRL REUS W/ TWL XL LVL3 (GOWN DISPOSABLE) ×1 IMPLANT
GOWN STRL REUS W/TWL LRG LVL3 (GOWN DISPOSABLE) ×2
GOWN STRL REUS W/TWL XL LVL3 (GOWN DISPOSABLE) ×2
GRADUATE 1200CC STRL 31836 (MISCELLANEOUS) ×3 IMPLANT
IV NS 1000ML (IV SOLUTION) ×2
IV NS 1000ML BAXH (IV SOLUTION) ×1 IMPLANT
KIT RM TURNOVER STRD PROC AR (KITS) ×3 IMPLANT
NEEDLE FILTER BLUNT 18X 1/2SAF (NEEDLE) ×2
NEEDLE FILTER BLUNT 18X1 1/2 (NEEDLE) ×1 IMPLANT
NEEDLE HYPO 25X1 1.5 SAFETY (NEEDLE) ×3 IMPLANT
NEEDLE SPNL 22GX3.5 QUINCKE BK (NEEDLE) IMPLANT
NS IRRIG 500ML POUR BTL (IV SOLUTION) ×3 IMPLANT
PACK BASIN MINOR ARMC (MISCELLANEOUS) ×3 IMPLANT
PACK PACE INSERTION (MISCELLANEOUS) ×3 IMPLANT
PAD STATPAD (MISCELLANEOUS) ×3 IMPLANT
PLASMABLADE 3.0S (MISCELLANEOUS) ×3
STRAP SAFETY BODY (MISCELLANEOUS) ×6 IMPLANT
STRIP CLOSURE SKIN 1/2X4 (GAUZE/BANDAGES/DRESSINGS) ×2 IMPLANT
SUT SILK 2 0 SH (SUTURE) ×3 IMPLANT
SUT VIC AB 2-0 CT1 27 (SUTURE)
SUT VIC AB 2-0 CT1 TAPERPNT 27 (SUTURE) IMPLANT
SUT VIC AB 2-0 CT2 27 (SUTURE) ×3 IMPLANT
SUT VIC AB 3-0 PS2 18 (SUTURE) IMPLANT
SUT VIC AB 4-0 PS2 18 (SUTURE) ×3 IMPLANT
SYR BULB IRRIG 60ML STRL (SYRINGE) ×3 IMPLANT
SYR CONTROL 10ML (SYRINGE) ×3 IMPLANT
SYRINGE 10CC LL (SYRINGE) ×3 IMPLANT

## 2016-02-21 NOTE — Progress Notes (Signed)
medtronic card and booklet given with discarge instructions

## 2016-02-21 NOTE — Op Note (Signed)
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 male with a diagnosis of DEFIB AT [place] who failed conservative measures and elected for surgical management.    The risks benefits and alternatives were discussed with the patient preoperatively including but not limited to the risks of infection, bleeding, cardiopulmonary complications, the need for revision surgery, among others, and the patient was willing to proceed.   OPERATIVE PROCEDURE: The patient was brought to the operating room the fasting state. The left pectoral region was prepped and draped in usual sterile manner. A 6 cm incision was performed a left pectoral region of the old ICD generator site. The ICD generator was retrieved by electrocautery and blunt dissection. The leads were disconnected and connected to a new high compatible IV ICD ( Amplia MRI CRT-D SureScan ). The ICD pocket was then on solution. It was positioned a pocket pocket was closed with 2-0 and 4-0 Vicryl, respectively. Steri-Strips and pressure dressing were applied. There were no procedural complications.

## 2016-02-21 NOTE — Interval H&P Note (Signed)
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 After consideration of risks, benefits and other options for treatment, the patient has consented to  Procedure(s): ICD GENERATOR CHANGE (Left) as a surgical intervention .  The patient's history has been reviewed, patient examined, no change in status, stable for surgery.  I have reviewed the patient's chart and labs.  Questions were answered to the patient's satisfaction.     Gordy Goar

## 2016-02-21 NOTE — H&P (Signed)
Printout Information Document Contents Office Visit Document Received Date 02/14/2016 Document Source Organization [facility] Patient Demographics  Reason for Visit  Encounter Details  Social Hx  Last Filed Vital Signs  Instructions  Progress Notes  Plan of Treatment  Visit Diagnoses  Document Information Encounter Summary - Adrian Chavez [id number] 75 y.o. Adrian Chavez of Apr. 12, 2017 Patient Demographics Patient Address Communication Language Race / Ethnicity  [address] [place], [place] [address]  [phone] [facility]) [phone] (Mobile)  English (Preferred) Dema Severin / Not Hispanic or Latino  Reason for Visit Reason Comments  Follow-up ICD ERI  Encounter Details Date Type Department Care Team Description  01/29/2016 Office Visit [facility]  [place], [place] [address]  [phone]  Isaias Cowman, [place]  [facility]  [place],  [address]  [phone]  [phone] (Fax)  Paroxysmal atrial fibrillation (CMS-HCC) (Primary Dx);Dilated cardiomyopathy (CMS-HCC);Chronic systolic congestive heart failure (CMS-HCC);Essential hypertension;ICD (implantable cardioverter-defibrillator) in place  Social History - as of this encounter Tobacco Use Types Packs/Day Years Used Date  Former Smoker Cigarettes 1 5 Quit: 11/04/1973  Smokeless Tobacco: Never Used      Alcohol Use Drinks/Week oz/Week Comments  No 0 Standard drinks or equivalent  0.0     Birth Sex Date Recorded  Unknown   Last Filed Vital Signs - in this encounter Vital Sign Reading Time Taken  Blood Pressure 112/62 01/29/2016 9:01 AM EDT  Pulse 70 01/29/2016 9:01 AM EDT  Temperature - -  Respiratory Rate - -  Oxygen Saturation - -  Inhaled Oxygen Concentration - -  Weight 82.3 kg (181 lb 6.4 oz) 01/29/2016 9:01 AM EDT  Height 170.2 cm (5\' 7" ) 01/29/2016 9:01 AM EDT  Body Mass Index 28.41 01/29/2016 9:01 AM EDT    Instructions - in this encounter Patient Instructions - Isaias Cowman, MD - 01/29/2016 8:45 AM EDT DASH Eating Plan DASH stands for "Dietary Approaches to Stop Hypertension." The DASH eating plan is a healthy eating plan that has been shown to reduce high blood pressure (hypertension). Additional health benefits may include reducing the risk of type 2 diabetes mellitus, heart disease, and stroke. The DASH eating plan may also help with weight loss. WHAT DO I NEED TO KNOW ABOUT THE DASH EATING PLAN? For the DASH eating plan, you will follow these general guidelines:  Choose foods with a percent daily value for sodium of less than 5% (as listed on the food label).  Use salt-free seasonings or herbs instead of table salt or sea salt.  Check with your health care provider or pharmacist before using salt substitutes.  Eat lower-sodium products, often labeled as "lower sodium" or "no salt added."  Eat fresh foods.  Eat more vegetables, fruits, and low-fat dairy products.  Choose whole grains. Look for the word "whole" as the first word in the ingredient list.  Choose fish and skinless chicken or [place] more often than red meat. Limit fish, poultry, and meat to 6 oz (170 g) each day.  Limit sweets, desserts, sugars, and sugary drinks.  Choose heart-healthy fats.  Limit cheese to 1 oz (28 g) per day.  Eat more home-cooked food and less restaurant, buffet, and fast food.  Limit fried foods.  Cook foods using methods other than frying.  Limit canned vegetables. If you do use them, rinse them well to decrease the sodium.  When eating at a restaurant, ask that your food be prepared with less salt, or no salt if  possible. WHAT FOODS CAN I EAT? Seek help from a dietitian for individual calorie needs. Grains Whole grain or whole wheat bread. Brown rice. Whole grain or whole wheat pasta. Quinoa, bulgur, and whole grain cereals. Low-sodium cereals. Corn or whole wheat flour  tortillas. Whole grain cornbread. Whole grain crackers. Low-sodium crackers. Vegetables Fresh or frozen vegetables (raw, steamed, roasted, or grilled). Low-sodium or reduced-sodium tomato and vegetable juices. Low-sodium or reduced-sodium tomato sauce and paste. Low-sodium or reduced-sodium canned vegetables.  Fruits All fresh, canned (in natural juice), or frozen fruits. Meat and Other Protein Products Ground beef (85% or leaner), grass-fed beef, or beef trimmed of fat. Skinless chicken or [place]. Ground chicken or [place]. Pork trimmed of fat. All fish and seafood. Eggs. Dried beans, peas, or lentils. Unsalted nuts and seeds. Unsalted canned beans. Dairy Low-fat dairy products, such as skim or 1% milk, 2% or reduced-fat cheeses, low-fat ricotta or cottage cheese, or plain low-fat yogurt. Low-sodium or reduced-sodium cheeses. Fats and Oils Tub margarines without trans fats. Light or reduced-fat mayonnaise and salad dressings (reduced sodium). Avocado. Safflower, olive, or canola oils. Natural peanut or almond butter. Other Unsalted popcorn and pretzels. The items listed above may not be a complete list of recommended foods or beverages. Contact your dietitian for more options. WHAT FOODS ARE NOT RECOMMENDED? Grains White bread. White pasta. White rice. Refined cornbread. Bagels and croissants. Crackers that contain trans fat. Vegetables Creamed or fried vegetables. Vegetables in a cheese sauce. Regular canned vegetables. Regular canned tomato sauce and paste. Regular tomato and vegetable juices. Fruits Dried fruits. Canned fruit in light or heavy syrup. Fruit juice. Meat and Other Protein Products Fatty cuts of meat. Ribs, chicken wings, bacon, sausage, bologna, salami, chitterlings, fatback, hot dogs, bratwurst, and packaged luncheon meats. Salted nuts and seeds. Canned beans with salt. Dairy Whole or 2% milk, cream, half-and-half, and cream cheese. Whole-fat or sweetened yogurt. Full-fat  cheeses or blue cheese. Nondairy creamers and whipped toppings. Processed cheese, cheese spreads, or cheese curds. Condiments Onion and garlic salt, seasoned salt, table salt, and sea salt. Canned and packaged gravies. Worcestershire sauce. Tartar sauce. Barbecue sauce. Teriyaki sauce. Soy sauce, including reduced sodium. Steak sauce. Fish sauce. Oyster sauce. Cocktail sauce. Horseradish. Ketchup and mustard. Meat flavorings and tenderizers. Bouillon cubes. Hot sauce. Tabasco sauce. Marinades. Taco seasonings. Relishes. Fats and Oils Butter, stick margarine, lard, shortening, ghee, and bacon fat. Coconut, palm kernel, or palm oils. Regular salad dressings. Other Pickles and olives. Salted popcorn and pretzels. The items listed above may not be a complete list of foods and beverages to avoid. Contact your dietitian for more information. WHERE CAN I FIND MORE INFORMATION? National Heart, Lung, and Blood Institute: travelstabloid.com  This information is not intended to replace advice given to you by your health care provider. Make sure you discuss any questions you have with your health care provider.  Document Released: 10/10/2011 Document Revised: 11/11/2014 Document Reviewed: 08/25/2013 Elsevier Interactive Patient Education 2016 [employer].   Progress Notes - in this encounter Isaias Cowman, MD - 01/29/2016 8:45 AM EDT Formatting of this note may be different from the original. Established Patient Visit   Chief Complaint: Chief Complaint  Patient presents with  . Follow-up  ICD ERI  Date of Service: 01/29/2016 Date of Birth: Jun 10, 1941 PCP: Dion Body, MD  History of Present Illness: Mr. Trokey is a 75 y.o.male patient who returns for  1. Nonischemic dilated cardiomyopathy 2. Chronic systolic congestive heart failure 3. Status post Bi V ICD 4.  Paroxysmal atrial fibrillation 5. AV nodal ablation 6. Essential hypertension 7. Mitral  regurgitation 8. History of pulmonary embolus  All, patient reports doing well. He denies chest pain shortness of breath. He is not experience any palpitations or heart racing. He denies peripheral edema. 2D echocardiogram 05/26/2014 revealed LV ejection fraction of 15-20% with severe mitral regurgitation.   ICD interrogation was performed 01/16/2016 which revealed the device was at elective replacement indication.  The patient has paroxysmal atrial fibrillation, status post AV nodal ablation, chads Vasc score 3, currently on warfarin, INR was 2.2 12/14/2015. The patient reports occasional nosebleeds.  The patient has a history of essential hypertension, blood pressure low normal, currently on carvedilol, and torsemide, which are well tolerated without apparent side effects. The patient follows a low-sodium, no added salt diet.  Past Medical and Surgical History  Past Medical History Past Medical History  Diagnosis Date  . Anxiety, unspecified  . BPH (benign prostatic hypertrophy)  . Chronic systolic congestive heart failure (CMS-HCC)  . History of elevated PSA  S/P negative prostate biopsy  . Hypertension  . Insomnia  . Ischemic colitis (CMS-HCC) 10/2010  Hx of ischemic colitis of the bowel  . Nonischemic dilated cardiomyopathy (CMS-HCC)  Hx of nonischemic dilated cardiomyopathy (EF 15-20%- 03/2013) s/p ICD with hx of PE and atrial fibrillation- followed by Dr. Saralyn Pilar and [facility]  . 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   Medications and Allergies  Current Medications  Current Outpatient Prescriptions  Medication Sig Dispense  Refill  . allopurinol (ZYLOPRIM) 100 MG tablet Take 1 tablet (100 mg total) by mouth once daily. 30 tablet 3  . ALPRAZolam (XANAX) 0.5 MG tablet Take 0.25 mg by mouth nightly as needed.  . benzonatate (TESSALON) 100 MG capsule TAKE 1-2 CAPSULE BY MOUTH 3 TIMES A DAY A DAY AS NEEDED FOR COUGH 90 capsule 0  . carvedilol (COREG) 3.125 MG tablet TAKE 1 TABLET BY MOUTH TWICE A DAY 180 tablet 1  . CIALIS 5 mg tablet TAKE 1 TABLET BY MOUTH EVERY DAY 30 tablet 4  . cyanocobalamin (VITAMIN B12) 1000 MCG tablet Take 1,000 mcg by mouth once daily.  Marland Kitchen KLOR-CON M20 20 mEq ER tablet TAKE 1 TABLET BY MOUTH TWICE A DAY 180 tablet 1  . loratadine (CLARITIN) 10 mg tablet Take 10 mg by mouth once daily.  . predniSONE (DELTASONE) 10 MG tablet Take 1 tablet (10 mg total) by mouth once daily. 30 tablet 0  . TORsemide (DEMADEX) 20 MG tablet TAKE 1 TABLET BY MOUTH TWICE A DAY 180 tablet 1  . warfarin (COUMADIN) 1 MG tablet Take 2 tablets (2 mg total) by mouth as directed. Take Coumadin 2mg  QD (2 tablets of Coumadin 1mg  QD) 180 tablet 1   No current facility-administered medications for this visit.   Allergies: Hydrocodone; Codeine; and Oxycodone  Social and Family History  Social History reports that he quit smoking about 42 years ago. His smoking use included Cigarettes. He has a 5.00 pack-year smoking history. He has never used smokeless tobacco. He reports that he does not drink alcohol or use illicit drugs.  Family History Family History  Problem Relation Age of Onset  . Colon polyps Mother  .  Coronary artery disease Father  . Prostate cancer Father  . No Known Problems Sister   Review of Systems   Review of Systems: The patient denies chest pain, shortness of breath, orthopnea, paroxysmal nocturnal dyspnea, reports pedal edema, without palpitations, heart racing, presyncope, syncope. Review of 12 Systems is negative except as described above.  Physical Examination   Vitals: Visit Vitals  . BP  112/62  . Pulse 70  . Ht 170.2 cm (5\' 7" )  . Wt 82.3 kg (181 lb 6.4 oz)  . BMI 28.41 kg/m2   Ht:170.2 cm (5\' 7" ) Wt:82.3 kg (181 lb 6.4 oz) [id number] surface area is 1.97 meters squared. Body mass index is 28.41 kg/(m^2).  HEENT: Pupils equally reactive to light and accomodation  Neck: Supple without thyromegaly, carotid pulses 2+ Lungs: clear to auscultation bilaterally; no wheezes, rales, rhonchi Heart: Regular rate and rhythm. No gallops, murmurs or rub Abdomen: soft nontender, nondistended, with normal bowel sounds Extremities: no cyanosis, clubbing, or edema Peripheral Pulses: 2+ in all extremities, 2+ femoral pulses bilaterally  Assessment   75 y.o. male with  1. Paroxysmal atrial fibrillation (CMS-HCC)  2. Dilated cardiomyopathy (CMS-HCC)  3. Chronic systolic congestive heart failure (CMS-HCC)  4. Essential hypertension  5. ICD (implantable cardioverter-defibrillator) in place   75 year old gentleman with severe nonischemic dilated cardiomyopathy, chronic systolic congestive heart failure, status post Bi V ICD currently appears clinically stable without significant fluid retention or symptoms. The patient has paroxysmal atrial fibrillation, status post AV nodal ablation, currently on warfarin for stroke prevention. Recent ICD interrogation revealed that the device was an elective replacement indication  Plan   1. Continue current medications 2. Continue warfarin for stroke prevention, target INR 2.0-3.0 3. Counseled patient about low-sodium diet 4. DASH diet printed instructions given to the patient 5. Counseled patient about low-cholesterol diet  6. Low-fat and cholesterol diet printed instructions given to the patient  7. ICD generator change out 8. Hold warfarin for 5 days prior to ICD generator change out 9. Return to clinic after ICD generator change  No orders of the defined types were placed in this encounter.  Return after ICD change-out.  Isaias Cowman, MD    Plan of Treatment - as of this encounter Upcoming Encounters Upcoming Encounters  Date Type Specialty Care Team Description  02/15/2016 Ancillary Orders Lab    02/19/2016 Ancillary Orders Lab Meda Klinefelter., MD  [address] [place], [place] [address]  [phone]  [phone] ([phone]    02/28/2016 Office Visit Rheumatology Meda Klinefelter., MD  [place] [place], [place] [address]  [phone]  [phone] ([phone]    04/11/2016 Office Visit Cardiology Isaias Cowman, MD  [place]  [facility]  [place], [place] [address]  [phone]  [phone] (Fax)    05/23/2016 Procedure visit Cardiology    Visit Diagnoses - in this encounter Diagnosis  Paroxysmal atrial fibrillation (CMS-HCC) - Primary  Atrial fibrillation   Dilated cardiomyopathy (CMS-HCC)  Other primary cardiomyopathies   Chronic systolic congestive heart failure (CMS-HCC)  Essential hypertension  ICD (implantable cardioverter-defibrillator) in place  Document Information Service Providers Document Coverage Dates Mar. 27, 2017 - Mar. 27, 2017 [place] [id number] (Work) [place], [place] [address] Encounter Providers Isaias Cowman MD (Attending) [phone] (Work) [phone] (Fax)  [facility] [place] [place], [place] [address] Encounter Date Mar. 27, 2017 - Mar. 27, 2017

## 2016-02-21 NOTE — Anesthesia Preprocedure Evaluation (Signed)
Anesthesia Evaluation  Patient identified by MRN, date of birth, ID band Patient awake    Reviewed: Allergy & Precautions, H&P , NPO status , Patient's Chart, lab work & pertinent test results, reviewed documented beta blocker date and time   History of Anesthesia Complications Negative for: history of anesthetic complications  Airway Mallampati: II  TM Distance: >3 FB Neck ROM: full    Dental no notable dental hx. (+) Partial Upper   Pulmonary neg shortness of breath, sleep apnea , neg COPD, neg recent URI, former smoker,    Pulmonary exam normal breath sounds clear to auscultation       Cardiovascular Exercise Tolerance: Good hypertension, (-) angina+CHF  (-) CAD, (-) Past MI, (-) Cardiac Stents and (-) CABG Normal cardiovascular exam+ dysrhythmias Atrial Fibrillation (-) Valvular Problems/Murmurs Rhythm:regular Rate:Normal     Neuro/Psych negative neurological ROS  negative psych ROS   GI/Hepatic negative GI ROS, Neg liver ROS,   Endo/Other  negative endocrine ROS  Renal/GU negative Renal ROS  negative genitourinary   Musculoskeletal   Abdominal   Peds  Hematology negative hematology ROS (+)   Anesthesia Other Findings Past Medical History:   CHF (congestive heart failure) (HCC)                         DVT (deep venous thrombosis) ([place])              2010           Comment:stomach and leg   Hypertension                                                 Dysrhythmia                                                    Comment:A-Fib   Colitis, ischemic ([place])                         2010           Comment:developed DVT   Anxiety                                                      Claustrophobia                                               Reproductive/Obstetrics negative OB ROS                             Anesthesia Physical Anesthesia Plan  ASA: III  Anesthesia Plan: General    Post-op Pain Management:    Induction:   Airway Management Planned:   Additional Equipment:   Intra-op Plan:   Post-operative Plan:   Informed Consent: I have reviewed the patients History and Physical, chart, labs and discussed the procedure including  the risks, benefits and alternatives for the proposed anesthesia with the patient or authorized representative who has indicated his/her understanding and acceptance.   Dental Advisory Given  Plan Discussed with: Anesthesiologist, CRNA and Surgeon  Anesthesia Plan Comments:         Anesthesia Quick Evaluation

## 2016-02-21 NOTE — Discharge Instructions (Signed)

## 2016-02-21 NOTE — Progress Notes (Signed)
Tele 211pm - per Lovey Newcomer, Dr Parascho's nurse, instruct pt and spouse that he can resume warfarin tomorrow, also can go back to twice daily instead of 3x daily on his potassium - discussed, and wrote on d/c instructions same for pt/spouse.

## 2016-02-21 NOTE — Transfer of Care (Signed)
Immediate Anesthesia Transfer of Care Note  Patient: Adrian Chavez  Procedure(s) Performed: Procedure(s): ICD GENERATOR CHANGE (Left)  Patient Location: PACU  Anesthesia Type:General  Level of Consciousness: sedated  Airway & Oxygen Therapy: Patient Spontanous Breathing and Patient connected to face mask oxygen  Post-op Assessment: Report given to RN and Post -op Vital signs reviewed and stable  Post vital signs: Reviewed and stable  Last Vitals:  Filed Vitals:   02/21/16 1104  BP: 132/93  Pulse: 83  Temp: 36.9 C  Resp: 16    Complications: No apparent anesthesia complications

## 2016-02-22 ENCOUNTER — Encounter: Payer: Self-pay | Admitting: Cardiology

## 2016-02-23 NOTE — Anesthesia Postprocedure Evaluation (Signed)
Anesthesia Post Note  Patient: Adrian Chavez  Procedure(s) Performed: Procedure(s) (LRB): ICD GENERATOR CHANGE (Left)  Patient location during evaluation: PACU Anesthesia Type: General Level of consciousness: awake and alert Pain management: pain level controlled Vital Signs Assessment: post-procedure vital signs reviewed and stable Respiratory status: spontaneous breathing, nonlabored ventilation, respiratory function stable and patient connected to nasal cannula oxygen Cardiovascular status: blood pressure returned to baseline and stable Postop Assessment: no signs of nausea or vomiting Anesthetic complications: no    Last Vitals:  Filed Vitals:   02/21/16 1350 02/21/16 1419  BP: 95/50 108/68  Pulse: 81 80  Temp: 36.7 C   Resp: 16 16    Last Pain: There were no vitals filed for this visit.               Martha Clan

## 2016-02-28 DIAGNOSIS — G8929 Other chronic pain: Secondary | ICD-10-CM | POA: Diagnosis not present

## 2016-02-28 DIAGNOSIS — I5022 Chronic systolic (congestive) heart failure: Secondary | ICD-10-CM | POA: Diagnosis not present

## 2016-02-28 DIAGNOSIS — M25512 Pain in left shoulder: Secondary | ICD-10-CM | POA: Diagnosis not present

## 2016-02-28 DIAGNOSIS — I420 Dilated cardiomyopathy: Secondary | ICD-10-CM | POA: Diagnosis not present

## 2016-02-28 DIAGNOSIS — Z95810 Presence of automatic (implantable) cardiac defibrillator: Secondary | ICD-10-CM | POA: Diagnosis not present

## 2016-02-28 DIAGNOSIS — I480 Paroxysmal atrial fibrillation: Secondary | ICD-10-CM | POA: Diagnosis not present

## 2016-02-28 DIAGNOSIS — R700 Elevated erythrocyte sedimentation rate: Secondary | ICD-10-CM | POA: Diagnosis not present

## 2016-02-28 DIAGNOSIS — M25511 Pain in right shoulder: Secondary | ICD-10-CM | POA: Diagnosis not present

## 2016-02-28 DIAGNOSIS — I10 Essential (primary) hypertension: Secondary | ICD-10-CM | POA: Diagnosis not present

## 2016-03-07 DIAGNOSIS — I480 Paroxysmal atrial fibrillation: Secondary | ICD-10-CM | POA: Diagnosis not present

## 2016-03-14 DIAGNOSIS — I480 Paroxysmal atrial fibrillation: Secondary | ICD-10-CM | POA: Diagnosis not present

## 2016-04-11 DIAGNOSIS — I480 Paroxysmal atrial fibrillation: Secondary | ICD-10-CM | POA: Diagnosis not present

## 2016-04-15 DIAGNOSIS — Z79899 Other long term (current) drug therapy: Secondary | ICD-10-CM | POA: Diagnosis not present

## 2016-04-15 DIAGNOSIS — I480 Paroxysmal atrial fibrillation: Secondary | ICD-10-CM | POA: Diagnosis not present

## 2016-04-15 DIAGNOSIS — R300 Dysuria: Secondary | ICD-10-CM | POA: Diagnosis not present

## 2016-05-17 DIAGNOSIS — J069 Acute upper respiratory infection, unspecified: Secondary | ICD-10-CM | POA: Diagnosis not present

## 2016-05-22 DIAGNOSIS — I480 Paroxysmal atrial fibrillation: Secondary | ICD-10-CM | POA: Diagnosis not present

## 2016-05-23 DIAGNOSIS — I480 Paroxysmal atrial fibrillation: Secondary | ICD-10-CM | POA: Diagnosis not present

## 2016-06-03 DIAGNOSIS — I5022 Chronic systolic (congestive) heart failure: Secondary | ICD-10-CM | POA: Diagnosis not present

## 2016-06-03 DIAGNOSIS — R972 Elevated prostate specific antigen [PSA]: Secondary | ICD-10-CM | POA: Diagnosis not present

## 2016-06-03 DIAGNOSIS — I480 Paroxysmal atrial fibrillation: Secondary | ICD-10-CM | POA: Diagnosis not present

## 2016-06-03 DIAGNOSIS — I10 Essential (primary) hypertension: Secondary | ICD-10-CM | POA: Diagnosis not present

## 2016-06-03 DIAGNOSIS — Z8739 Personal history of other diseases of the musculoskeletal system and connective tissue: Secondary | ICD-10-CM | POA: Diagnosis not present

## 2016-06-06 DIAGNOSIS — R21 Rash and other nonspecific skin eruption: Secondary | ICD-10-CM | POA: Diagnosis not present

## 2016-06-10 DIAGNOSIS — I480 Paroxysmal atrial fibrillation: Secondary | ICD-10-CM | POA: Diagnosis not present

## 2016-06-24 DIAGNOSIS — I480 Paroxysmal atrial fibrillation: Secondary | ICD-10-CM | POA: Diagnosis not present

## 2016-07-01 DIAGNOSIS — M1A00X0 Idiopathic chronic gout, unspecified site, without tophus (tophi): Secondary | ICD-10-CM | POA: Diagnosis not present

## 2016-07-01 DIAGNOSIS — G8929 Other chronic pain: Secondary | ICD-10-CM | POA: Diagnosis not present

## 2016-07-01 DIAGNOSIS — M25512 Pain in left shoulder: Secondary | ICD-10-CM | POA: Diagnosis not present

## 2016-07-01 DIAGNOSIS — R700 Elevated erythrocyte sedimentation rate: Secondary | ICD-10-CM | POA: Diagnosis not present

## 2016-07-11 DIAGNOSIS — Z95810 Presence of automatic (implantable) cardiac defibrillator: Secondary | ICD-10-CM | POA: Diagnosis not present

## 2016-07-11 DIAGNOSIS — I5022 Chronic systolic (congestive) heart failure: Secondary | ICD-10-CM | POA: Diagnosis not present

## 2016-07-11 DIAGNOSIS — I255 Ischemic cardiomyopathy: Secondary | ICD-10-CM | POA: Diagnosis not present

## 2016-07-11 DIAGNOSIS — I10 Essential (primary) hypertension: Secondary | ICD-10-CM | POA: Diagnosis not present

## 2016-07-11 DIAGNOSIS — I480 Paroxysmal atrial fibrillation: Secondary | ICD-10-CM | POA: Diagnosis not present

## 2016-07-25 DIAGNOSIS — I480 Paroxysmal atrial fibrillation: Secondary | ICD-10-CM | POA: Diagnosis not present

## 2016-08-08 DIAGNOSIS — I480 Paroxysmal atrial fibrillation: Secondary | ICD-10-CM | POA: Diagnosis not present

## 2016-08-22 DIAGNOSIS — I480 Paroxysmal atrial fibrillation: Secondary | ICD-10-CM | POA: Diagnosis not present

## 2016-09-10 DIAGNOSIS — I480 Paroxysmal atrial fibrillation: Secondary | ICD-10-CM | POA: Diagnosis not present

## 2016-09-23 DIAGNOSIS — I480 Paroxysmal atrial fibrillation: Secondary | ICD-10-CM | POA: Diagnosis not present

## 2016-09-25 DIAGNOSIS — I480 Paroxysmal atrial fibrillation: Secondary | ICD-10-CM | POA: Diagnosis not present

## 2016-10-02 DIAGNOSIS — I480 Paroxysmal atrial fibrillation: Secondary | ICD-10-CM | POA: Diagnosis not present

## 2016-10-02 DIAGNOSIS — G8929 Other chronic pain: Secondary | ICD-10-CM | POA: Diagnosis not present

## 2016-10-02 DIAGNOSIS — M25512 Pain in left shoulder: Secondary | ICD-10-CM | POA: Diagnosis not present

## 2016-10-02 DIAGNOSIS — M1A00X0 Idiopathic chronic gout, unspecified site, without tophus (tophi): Secondary | ICD-10-CM | POA: Diagnosis not present

## 2016-10-07 DIAGNOSIS — I482 Chronic atrial fibrillation: Secondary | ICD-10-CM | POA: Diagnosis not present

## 2016-10-07 DIAGNOSIS — I10 Essential (primary) hypertension: Secondary | ICD-10-CM | POA: Diagnosis not present

## 2016-10-07 DIAGNOSIS — Z8739 Personal history of other diseases of the musculoskeletal system and connective tissue: Secondary | ICD-10-CM | POA: Diagnosis not present

## 2016-10-16 DIAGNOSIS — I480 Paroxysmal atrial fibrillation: Secondary | ICD-10-CM | POA: Diagnosis not present

## 2016-11-13 DIAGNOSIS — I480 Paroxysmal atrial fibrillation: Secondary | ICD-10-CM | POA: Diagnosis not present

## 2016-11-30 DIAGNOSIS — J029 Acute pharyngitis, unspecified: Secondary | ICD-10-CM | POA: Diagnosis not present

## 2016-12-06 DIAGNOSIS — R05 Cough: Secondary | ICD-10-CM | POA: Diagnosis not present

## 2016-12-06 DIAGNOSIS — R300 Dysuria: Secondary | ICD-10-CM | POA: Diagnosis not present

## 2016-12-10 DIAGNOSIS — I480 Paroxysmal atrial fibrillation: Secondary | ICD-10-CM | POA: Diagnosis not present

## 2016-12-17 DIAGNOSIS — I480 Paroxysmal atrial fibrillation: Secondary | ICD-10-CM | POA: Diagnosis not present

## 2016-12-19 DIAGNOSIS — I480 Paroxysmal atrial fibrillation: Secondary | ICD-10-CM | POA: Diagnosis not present

## 2016-12-24 DIAGNOSIS — Z95810 Presence of automatic (implantable) cardiac defibrillator: Secondary | ICD-10-CM | POA: Diagnosis not present

## 2016-12-24 DIAGNOSIS — I10 Essential (primary) hypertension: Secondary | ICD-10-CM | POA: Diagnosis not present

## 2016-12-24 DIAGNOSIS — I5022 Chronic systolic (congestive) heart failure: Secondary | ICD-10-CM | POA: Diagnosis not present

## 2016-12-24 DIAGNOSIS — I255 Ischemic cardiomyopathy: Secondary | ICD-10-CM | POA: Diagnosis not present

## 2016-12-24 DIAGNOSIS — I482 Chronic atrial fibrillation: Secondary | ICD-10-CM | POA: Diagnosis not present

## 2016-12-24 DIAGNOSIS — I2699 Other pulmonary embolism without acute cor pulmonale: Secondary | ICD-10-CM | POA: Diagnosis not present

## 2017-01-02 DIAGNOSIS — I480 Paroxysmal atrial fibrillation: Secondary | ICD-10-CM | POA: Diagnosis not present

## 2017-01-14 DIAGNOSIS — I5022 Chronic systolic (congestive) heart failure: Secondary | ICD-10-CM | POA: Diagnosis not present

## 2017-01-14 DIAGNOSIS — I255 Ischemic cardiomyopathy: Secondary | ICD-10-CM | POA: Diagnosis not present

## 2017-01-14 DIAGNOSIS — Z95810 Presence of automatic (implantable) cardiac defibrillator: Secondary | ICD-10-CM | POA: Diagnosis not present

## 2017-01-16 DIAGNOSIS — I4891 Unspecified atrial fibrillation: Secondary | ICD-10-CM | POA: Diagnosis not present

## 2017-02-13 DIAGNOSIS — I4891 Unspecified atrial fibrillation: Secondary | ICD-10-CM | POA: Diagnosis not present

## 2017-02-27 DIAGNOSIS — G8929 Other chronic pain: Secondary | ICD-10-CM | POA: Diagnosis not present

## 2017-02-27 DIAGNOSIS — M25512 Pain in left shoulder: Secondary | ICD-10-CM | POA: Diagnosis not present

## 2017-02-27 DIAGNOSIS — M150 Primary generalized (osteo)arthritis: Secondary | ICD-10-CM | POA: Diagnosis not present

## 2017-03-11 DIAGNOSIS — I480 Paroxysmal atrial fibrillation: Secondary | ICD-10-CM | POA: Diagnosis not present

## 2017-03-17 DIAGNOSIS — I4891 Unspecified atrial fibrillation: Secondary | ICD-10-CM | POA: Diagnosis not present

## 2017-04-01 DIAGNOSIS — R791 Abnormal coagulation profile: Secondary | ICD-10-CM | POA: Diagnosis not present

## 2017-04-15 DIAGNOSIS — I10 Essential (primary) hypertension: Secondary | ICD-10-CM | POA: Diagnosis not present

## 2017-04-15 DIAGNOSIS — I5022 Chronic systolic (congestive) heart failure: Secondary | ICD-10-CM | POA: Diagnosis not present

## 2017-04-15 DIAGNOSIS — I482 Chronic atrial fibrillation: Secondary | ICD-10-CM | POA: Diagnosis not present

## 2017-04-15 DIAGNOSIS — Z95810 Presence of automatic (implantable) cardiac defibrillator: Secondary | ICD-10-CM | POA: Diagnosis not present

## 2017-04-15 DIAGNOSIS — I255 Ischemic cardiomyopathy: Secondary | ICD-10-CM | POA: Diagnosis not present

## 2017-04-15 DIAGNOSIS — I2699 Other pulmonary embolism without acute cor pulmonale: Secondary | ICD-10-CM | POA: Diagnosis not present

## 2017-05-13 DIAGNOSIS — I482 Chronic atrial fibrillation: Secondary | ICD-10-CM | POA: Diagnosis not present

## 2017-05-27 DIAGNOSIS — L03116 Cellulitis of left lower limb: Secondary | ICD-10-CM | POA: Diagnosis not present

## 2017-05-27 DIAGNOSIS — Z23 Encounter for immunization: Secondary | ICD-10-CM | POA: Diagnosis not present

## 2017-06-11 DIAGNOSIS — C44612 Basal cell carcinoma of skin of right upper limb, including shoulder: Secondary | ICD-10-CM | POA: Diagnosis not present

## 2017-06-11 DIAGNOSIS — D0462 Carcinoma in situ of skin of left upper limb, including shoulder: Secondary | ICD-10-CM | POA: Diagnosis not present

## 2017-06-11 DIAGNOSIS — D2261 Melanocytic nevi of right upper limb, including shoulder: Secondary | ICD-10-CM | POA: Diagnosis not present

## 2017-06-11 DIAGNOSIS — C44529 Squamous cell carcinoma of skin of other part of trunk: Secondary | ICD-10-CM | POA: Diagnosis not present

## 2017-06-11 DIAGNOSIS — C44311 Basal cell carcinoma of skin of nose: Secondary | ICD-10-CM | POA: Diagnosis not present

## 2017-06-11 DIAGNOSIS — L570 Actinic keratosis: Secondary | ICD-10-CM | POA: Diagnosis not present

## 2017-06-11 DIAGNOSIS — D485 Neoplasm of uncertain behavior of skin: Secondary | ICD-10-CM | POA: Diagnosis not present

## 2017-06-11 DIAGNOSIS — X32XXXA Exposure to sunlight, initial encounter: Secondary | ICD-10-CM | POA: Diagnosis not present

## 2017-06-11 DIAGNOSIS — C4441 Basal cell carcinoma of skin of scalp and neck: Secondary | ICD-10-CM | POA: Diagnosis not present

## 2017-06-11 DIAGNOSIS — D225 Melanocytic nevi of trunk: Secondary | ICD-10-CM | POA: Diagnosis not present

## 2017-06-11 DIAGNOSIS — D2271 Melanocytic nevi of right lower limb, including hip: Secondary | ICD-10-CM | POA: Diagnosis not present

## 2017-06-11 DIAGNOSIS — D2272 Melanocytic nevi of left lower limb, including hip: Secondary | ICD-10-CM | POA: Diagnosis not present

## 2017-06-13 DIAGNOSIS — I482 Chronic atrial fibrillation: Secondary | ICD-10-CM | POA: Diagnosis not present

## 2017-06-17 DIAGNOSIS — I480 Paroxysmal atrial fibrillation: Secondary | ICD-10-CM | POA: Diagnosis not present

## 2017-06-27 DIAGNOSIS — I482 Chronic atrial fibrillation: Secondary | ICD-10-CM | POA: Diagnosis not present

## 2017-07-08 DIAGNOSIS — Z79899 Other long term (current) drug therapy: Secondary | ICD-10-CM | POA: Diagnosis not present

## 2017-07-08 DIAGNOSIS — I10 Essential (primary) hypertension: Secondary | ICD-10-CM | POA: Diagnosis not present

## 2017-07-08 DIAGNOSIS — Z136 Encounter for screening for cardiovascular disorders: Secondary | ICD-10-CM | POA: Diagnosis not present

## 2017-07-08 DIAGNOSIS — Z8739 Personal history of other diseases of the musculoskeletal system and connective tissue: Secondary | ICD-10-CM | POA: Diagnosis not present

## 2017-07-14 DIAGNOSIS — I10 Essential (primary) hypertension: Secondary | ICD-10-CM | POA: Diagnosis not present

## 2017-07-14 DIAGNOSIS — Z0000 Encounter for general adult medical examination without abnormal findings: Secondary | ICD-10-CM | POA: Diagnosis not present

## 2017-07-14 DIAGNOSIS — Z23 Encounter for immunization: Secondary | ICD-10-CM | POA: Diagnosis not present

## 2017-07-14 DIAGNOSIS — Z8739 Personal history of other diseases of the musculoskeletal system and connective tissue: Secondary | ICD-10-CM | POA: Diagnosis not present

## 2017-07-29 DIAGNOSIS — L905 Scar conditions and fibrosis of skin: Secondary | ICD-10-CM | POA: Diagnosis not present

## 2017-07-29 DIAGNOSIS — C44612 Basal cell carcinoma of skin of right upper limb, including shoulder: Secondary | ICD-10-CM | POA: Diagnosis not present

## 2017-07-29 DIAGNOSIS — C44529 Squamous cell carcinoma of skin of other part of trunk: Secondary | ICD-10-CM | POA: Diagnosis not present

## 2017-07-29 DIAGNOSIS — I482 Chronic atrial fibrillation: Secondary | ICD-10-CM | POA: Diagnosis not present

## 2017-08-12 DIAGNOSIS — D0462 Carcinoma in situ of skin of left upper limb, including shoulder: Secondary | ICD-10-CM | POA: Diagnosis not present

## 2017-08-14 DIAGNOSIS — C4441 Basal cell carcinoma of skin of scalp and neck: Secondary | ICD-10-CM | POA: Diagnosis not present

## 2017-08-14 DIAGNOSIS — C44311 Basal cell carcinoma of skin of nose: Secondary | ICD-10-CM | POA: Diagnosis not present

## 2017-08-14 DIAGNOSIS — L578 Other skin changes due to chronic exposure to nonionizing radiation: Secondary | ICD-10-CM | POA: Diagnosis not present

## 2017-08-14 DIAGNOSIS — L814 Other melanin hyperpigmentation: Secondary | ICD-10-CM | POA: Diagnosis not present

## 2017-08-26 DIAGNOSIS — I482 Chronic atrial fibrillation: Secondary | ICD-10-CM | POA: Diagnosis not present

## 2017-08-26 DIAGNOSIS — I2699 Other pulmonary embolism without acute cor pulmonale: Secondary | ICD-10-CM | POA: Diagnosis not present

## 2017-08-26 DIAGNOSIS — I10 Essential (primary) hypertension: Secondary | ICD-10-CM | POA: Diagnosis not present

## 2017-08-26 DIAGNOSIS — I255 Ischemic cardiomyopathy: Secondary | ICD-10-CM | POA: Diagnosis not present

## 2017-08-26 DIAGNOSIS — Z95810 Presence of automatic (implantable) cardiac defibrillator: Secondary | ICD-10-CM | POA: Diagnosis not present

## 2017-08-26 DIAGNOSIS — I5022 Chronic systolic (congestive) heart failure: Secondary | ICD-10-CM | POA: Diagnosis not present

## 2017-09-09 DIAGNOSIS — I482 Chronic atrial fibrillation: Secondary | ICD-10-CM | POA: Diagnosis not present

## 2017-09-16 DIAGNOSIS — I5022 Chronic systolic (congestive) heart failure: Secondary | ICD-10-CM | POA: Diagnosis not present

## 2017-09-24 DIAGNOSIS — G8929 Other chronic pain: Secondary | ICD-10-CM | POA: Diagnosis not present

## 2017-09-24 DIAGNOSIS — I482 Chronic atrial fibrillation: Secondary | ICD-10-CM | POA: Diagnosis not present

## 2017-09-24 DIAGNOSIS — M25512 Pain in left shoulder: Secondary | ICD-10-CM | POA: Diagnosis not present

## 2017-11-06 DIAGNOSIS — N410 Acute prostatitis: Secondary | ICD-10-CM | POA: Diagnosis not present

## 2017-11-06 DIAGNOSIS — I482 Chronic atrial fibrillation: Secondary | ICD-10-CM | POA: Diagnosis not present

## 2017-11-06 DIAGNOSIS — R300 Dysuria: Secondary | ICD-10-CM | POA: Diagnosis not present

## 2017-11-20 DIAGNOSIS — I482 Chronic atrial fibrillation: Secondary | ICD-10-CM | POA: Diagnosis not present

## 2017-11-27 DIAGNOSIS — Z95810 Presence of automatic (implantable) cardiac defibrillator: Secondary | ICD-10-CM | POA: Diagnosis not present

## 2017-11-27 DIAGNOSIS — I255 Ischemic cardiomyopathy: Secondary | ICD-10-CM | POA: Diagnosis not present

## 2017-11-27 DIAGNOSIS — I2699 Other pulmonary embolism without acute cor pulmonale: Secondary | ICD-10-CM | POA: Diagnosis not present

## 2017-11-27 DIAGNOSIS — I482 Chronic atrial fibrillation: Secondary | ICD-10-CM | POA: Diagnosis not present

## 2017-11-27 DIAGNOSIS — I10 Essential (primary) hypertension: Secondary | ICD-10-CM | POA: Diagnosis not present

## 2017-11-27 DIAGNOSIS — I5022 Chronic systolic (congestive) heart failure: Secondary | ICD-10-CM | POA: Diagnosis not present

## 2017-12-04 DIAGNOSIS — I482 Chronic atrial fibrillation: Secondary | ICD-10-CM | POA: Diagnosis not present

## 2017-12-08 DIAGNOSIS — I482 Chronic atrial fibrillation: Secondary | ICD-10-CM | POA: Diagnosis not present

## 2017-12-15 DIAGNOSIS — R791 Abnormal coagulation profile: Secondary | ICD-10-CM | POA: Diagnosis not present

## 2017-12-30 DIAGNOSIS — H2513 Age-related nuclear cataract, bilateral: Secondary | ICD-10-CM | POA: Diagnosis not present

## 2017-12-30 DIAGNOSIS — I482 Chronic atrial fibrillation: Secondary | ICD-10-CM | POA: Diagnosis not present

## 2018-01-01 DIAGNOSIS — Z7901 Long term (current) use of anticoagulants: Secondary | ICD-10-CM | POA: Diagnosis not present

## 2018-01-01 DIAGNOSIS — R791 Abnormal coagulation profile: Secondary | ICD-10-CM | POA: Diagnosis not present

## 2018-01-13 DIAGNOSIS — I482 Chronic atrial fibrillation: Secondary | ICD-10-CM | POA: Diagnosis not present

## 2018-01-13 DIAGNOSIS — I10 Essential (primary) hypertension: Secondary | ICD-10-CM | POA: Diagnosis not present

## 2018-01-13 DIAGNOSIS — Z8739 Personal history of other diseases of the musculoskeletal system and connective tissue: Secondary | ICD-10-CM | POA: Diagnosis not present

## 2018-01-13 DIAGNOSIS — I5022 Chronic systolic (congestive) heart failure: Secondary | ICD-10-CM | POA: Diagnosis not present

## 2018-01-13 DIAGNOSIS — F411 Generalized anxiety disorder: Secondary | ICD-10-CM | POA: Diagnosis not present

## 2018-01-15 DIAGNOSIS — I482 Chronic atrial fibrillation: Secondary | ICD-10-CM | POA: Diagnosis not present

## 2018-01-29 DIAGNOSIS — I482 Chronic atrial fibrillation: Secondary | ICD-10-CM | POA: Diagnosis not present

## 2018-02-16 DIAGNOSIS — I482 Chronic atrial fibrillation: Secondary | ICD-10-CM | POA: Diagnosis not present

## 2018-02-24 DIAGNOSIS — I482 Chronic atrial fibrillation: Secondary | ICD-10-CM | POA: Diagnosis not present

## 2018-02-24 DIAGNOSIS — N401 Enlarged prostate with lower urinary tract symptoms: Secondary | ICD-10-CM | POA: Diagnosis not present

## 2018-02-24 DIAGNOSIS — Z136 Encounter for screening for cardiovascular disorders: Secondary | ICD-10-CM | POA: Diagnosis not present

## 2018-02-24 DIAGNOSIS — Z0000 Encounter for general adult medical examination without abnormal findings: Secondary | ICD-10-CM | POA: Diagnosis not present

## 2018-02-24 DIAGNOSIS — F411 Generalized anxiety disorder: Secondary | ICD-10-CM | POA: Diagnosis not present

## 2018-02-26 DIAGNOSIS — I10 Essential (primary) hypertension: Secondary | ICD-10-CM | POA: Diagnosis not present

## 2018-02-26 DIAGNOSIS — I5022 Chronic systolic (congestive) heart failure: Secondary | ICD-10-CM | POA: Diagnosis not present

## 2018-02-26 DIAGNOSIS — I5042 Chronic combined systolic (congestive) and diastolic (congestive) heart failure: Secondary | ICD-10-CM | POA: Diagnosis present

## 2018-02-26 DIAGNOSIS — I255 Ischemic cardiomyopathy: Secondary | ICD-10-CM | POA: Diagnosis not present

## 2018-02-26 DIAGNOSIS — Z95810 Presence of automatic (implantable) cardiac defibrillator: Secondary | ICD-10-CM | POA: Diagnosis not present

## 2018-02-26 DIAGNOSIS — I5043 Acute on chronic combined systolic (congestive) and diastolic (congestive) heart failure: Secondary | ICD-10-CM | POA: Diagnosis present

## 2018-02-26 DIAGNOSIS — I482 Chronic atrial fibrillation: Secondary | ICD-10-CM | POA: Diagnosis not present

## 2018-03-02 DIAGNOSIS — I4891 Unspecified atrial fibrillation: Secondary | ICD-10-CM | POA: Diagnosis not present

## 2018-03-05 DIAGNOSIS — I482 Chronic atrial fibrillation: Secondary | ICD-10-CM | POA: Diagnosis not present

## 2018-03-05 DIAGNOSIS — I255 Ischemic cardiomyopathy: Secondary | ICD-10-CM | POA: Diagnosis not present

## 2018-03-05 DIAGNOSIS — I5022 Chronic systolic (congestive) heart failure: Secondary | ICD-10-CM | POA: Diagnosis not present

## 2018-03-16 DIAGNOSIS — Z85828 Personal history of other malignant neoplasm of skin: Secondary | ICD-10-CM | POA: Diagnosis not present

## 2018-03-16 DIAGNOSIS — C44529 Squamous cell carcinoma of skin of other part of trunk: Secondary | ICD-10-CM | POA: Diagnosis not present

## 2018-03-16 DIAGNOSIS — L570 Actinic keratosis: Secondary | ICD-10-CM | POA: Diagnosis not present

## 2018-03-16 DIAGNOSIS — X32XXXA Exposure to sunlight, initial encounter: Secondary | ICD-10-CM | POA: Diagnosis not present

## 2018-03-16 DIAGNOSIS — D2262 Melanocytic nevi of left upper limb, including shoulder: Secondary | ICD-10-CM | POA: Diagnosis not present

## 2018-03-16 DIAGNOSIS — D2272 Melanocytic nevi of left lower limb, including hip: Secondary | ICD-10-CM | POA: Diagnosis not present

## 2018-03-16 DIAGNOSIS — C44519 Basal cell carcinoma of skin of other part of trunk: Secondary | ICD-10-CM | POA: Diagnosis not present

## 2018-03-16 DIAGNOSIS — D2261 Melanocytic nevi of right upper limb, including shoulder: Secondary | ICD-10-CM | POA: Diagnosis not present

## 2018-03-16 DIAGNOSIS — D225 Melanocytic nevi of trunk: Secondary | ICD-10-CM | POA: Diagnosis not present

## 2018-03-16 DIAGNOSIS — Z08 Encounter for follow-up examination after completed treatment for malignant neoplasm: Secondary | ICD-10-CM | POA: Diagnosis not present

## 2018-03-16 DIAGNOSIS — D485 Neoplasm of uncertain behavior of skin: Secondary | ICD-10-CM | POA: Diagnosis not present

## 2018-03-16 DIAGNOSIS — D2271 Melanocytic nevi of right lower limb, including hip: Secondary | ICD-10-CM | POA: Diagnosis not present

## 2018-03-17 DIAGNOSIS — I4891 Unspecified atrial fibrillation: Secondary | ICD-10-CM | POA: Diagnosis not present

## 2018-03-18 DIAGNOSIS — M25572 Pain in left ankle and joints of left foot: Secondary | ICD-10-CM | POA: Diagnosis not present

## 2018-03-18 DIAGNOSIS — M79609 Pain in unspecified limb: Secondary | ICD-10-CM | POA: Diagnosis not present

## 2018-03-24 DIAGNOSIS — Z79899 Other long term (current) drug therapy: Secondary | ICD-10-CM | POA: Diagnosis not present

## 2018-03-24 DIAGNOSIS — I482 Chronic atrial fibrillation: Secondary | ICD-10-CM | POA: Diagnosis not present

## 2018-03-31 DIAGNOSIS — R791 Abnormal coagulation profile: Secondary | ICD-10-CM | POA: Diagnosis not present

## 2018-04-08 DIAGNOSIS — H903 Sensorineural hearing loss, bilateral: Secondary | ICD-10-CM | POA: Diagnosis not present

## 2018-04-14 DIAGNOSIS — I482 Chronic atrial fibrillation: Secondary | ICD-10-CM | POA: Diagnosis not present

## 2018-04-14 DIAGNOSIS — I480 Paroxysmal atrial fibrillation: Secondary | ICD-10-CM | POA: Diagnosis not present

## 2018-04-16 DIAGNOSIS — H903 Sensorineural hearing loss, bilateral: Secondary | ICD-10-CM | POA: Diagnosis not present

## 2018-04-28 DIAGNOSIS — I482 Chronic atrial fibrillation: Secondary | ICD-10-CM | POA: Diagnosis not present

## 2018-05-05 DIAGNOSIS — D045 Carcinoma in situ of skin of trunk: Secondary | ICD-10-CM | POA: Diagnosis not present

## 2018-05-05 DIAGNOSIS — C44529 Squamous cell carcinoma of skin of other part of trunk: Secondary | ICD-10-CM | POA: Diagnosis not present

## 2018-05-18 DIAGNOSIS — R350 Frequency of micturition: Secondary | ICD-10-CM | POA: Diagnosis not present

## 2018-05-18 DIAGNOSIS — R300 Dysuria: Secondary | ICD-10-CM | POA: Diagnosis not present

## 2018-05-19 DIAGNOSIS — D485 Neoplasm of uncertain behavior of skin: Secondary | ICD-10-CM | POA: Diagnosis not present

## 2018-05-19 DIAGNOSIS — C44519 Basal cell carcinoma of skin of other part of trunk: Secondary | ICD-10-CM | POA: Diagnosis not present

## 2018-05-26 DIAGNOSIS — I10 Essential (primary) hypertension: Secondary | ICD-10-CM | POA: Diagnosis not present

## 2018-05-26 DIAGNOSIS — I255 Ischemic cardiomyopathy: Secondary | ICD-10-CM | POA: Diagnosis not present

## 2018-05-26 DIAGNOSIS — I482 Chronic atrial fibrillation: Secondary | ICD-10-CM | POA: Diagnosis not present

## 2018-05-26 DIAGNOSIS — I5022 Chronic systolic (congestive) heart failure: Secondary | ICD-10-CM | POA: Diagnosis not present

## 2018-05-26 DIAGNOSIS — Z95810 Presence of automatic (implantable) cardiac defibrillator: Secondary | ICD-10-CM | POA: Diagnosis not present

## 2018-06-05 DIAGNOSIS — I482 Chronic atrial fibrillation: Secondary | ICD-10-CM | POA: Diagnosis not present

## 2018-06-05 DIAGNOSIS — I5022 Chronic systolic (congestive) heart failure: Secondary | ICD-10-CM | POA: Diagnosis not present

## 2018-06-05 DIAGNOSIS — M109 Gout, unspecified: Secondary | ICD-10-CM | POA: Diagnosis not present

## 2018-06-10 DIAGNOSIS — I5022 Chronic systolic (congestive) heart failure: Secondary | ICD-10-CM | POA: Diagnosis not present

## 2018-06-10 DIAGNOSIS — Z95810 Presence of automatic (implantable) cardiac defibrillator: Secondary | ICD-10-CM | POA: Diagnosis not present

## 2018-06-10 DIAGNOSIS — Z79899 Other long term (current) drug therapy: Secondary | ICD-10-CM | POA: Diagnosis not present

## 2018-06-10 DIAGNOSIS — I10 Essential (primary) hypertension: Secondary | ICD-10-CM | POA: Diagnosis not present

## 2018-06-10 DIAGNOSIS — I482 Chronic atrial fibrillation: Secondary | ICD-10-CM | POA: Diagnosis not present

## 2018-06-10 DIAGNOSIS — I255 Ischemic cardiomyopathy: Secondary | ICD-10-CM | POA: Diagnosis not present

## 2018-06-15 ENCOUNTER — Ambulatory Visit: Payer: Self-pay | Admitting: Urology

## 2018-06-16 DIAGNOSIS — Z79899 Other long term (current) drug therapy: Secondary | ICD-10-CM | POA: Diagnosis not present

## 2018-06-17 DIAGNOSIS — I5022 Chronic systolic (congestive) heart failure: Secondary | ICD-10-CM | POA: Diagnosis not present

## 2018-06-17 DIAGNOSIS — Z95810 Presence of automatic (implantable) cardiac defibrillator: Secondary | ICD-10-CM | POA: Diagnosis not present

## 2018-06-17 DIAGNOSIS — I255 Ischemic cardiomyopathy: Secondary | ICD-10-CM | POA: Diagnosis not present

## 2018-06-17 DIAGNOSIS — I10 Essential (primary) hypertension: Secondary | ICD-10-CM | POA: Diagnosis not present

## 2018-06-17 DIAGNOSIS — I482 Chronic atrial fibrillation: Secondary | ICD-10-CM | POA: Diagnosis not present

## 2018-06-22 DIAGNOSIS — Z79899 Other long term (current) drug therapy: Secondary | ICD-10-CM | POA: Diagnosis not present

## 2018-06-22 DIAGNOSIS — I482 Chronic atrial fibrillation: Secondary | ICD-10-CM | POA: Diagnosis not present

## 2018-06-22 DIAGNOSIS — R5383 Other fatigue: Secondary | ICD-10-CM | POA: Diagnosis not present

## 2018-06-22 DIAGNOSIS — I428 Other cardiomyopathies: Secondary | ICD-10-CM | POA: Diagnosis not present

## 2018-06-22 DIAGNOSIS — Z8739 Personal history of other diseases of the musculoskeletal system and connective tissue: Secondary | ICD-10-CM | POA: Diagnosis not present

## 2018-06-29 DIAGNOSIS — L570 Actinic keratosis: Secondary | ICD-10-CM | POA: Diagnosis not present

## 2018-06-29 DIAGNOSIS — X32XXXA Exposure to sunlight, initial encounter: Secondary | ICD-10-CM | POA: Diagnosis not present

## 2018-06-29 DIAGNOSIS — C44519 Basal cell carcinoma of skin of other part of trunk: Secondary | ICD-10-CM | POA: Diagnosis not present

## 2018-07-07 DIAGNOSIS — M109 Gout, unspecified: Secondary | ICD-10-CM | POA: Diagnosis not present

## 2018-07-07 DIAGNOSIS — R791 Abnormal coagulation profile: Secondary | ICD-10-CM | POA: Diagnosis not present

## 2018-07-07 DIAGNOSIS — I5022 Chronic systolic (congestive) heart failure: Secondary | ICD-10-CM | POA: Diagnosis not present

## 2018-07-14 DIAGNOSIS — I5022 Chronic systolic (congestive) heart failure: Secondary | ICD-10-CM | POA: Diagnosis not present

## 2018-07-22 DIAGNOSIS — G8929 Other chronic pain: Secondary | ICD-10-CM | POA: Diagnosis not present

## 2018-07-22 DIAGNOSIS — M25512 Pain in left shoulder: Secondary | ICD-10-CM | POA: Diagnosis not present

## 2018-07-22 DIAGNOSIS — I482 Chronic atrial fibrillation: Secondary | ICD-10-CM | POA: Diagnosis not present

## 2018-07-27 DIAGNOSIS — I5022 Chronic systolic (congestive) heart failure: Secondary | ICD-10-CM | POA: Diagnosis not present

## 2018-07-27 DIAGNOSIS — I428 Other cardiomyopathies: Secondary | ICD-10-CM | POA: Diagnosis not present

## 2018-07-27 DIAGNOSIS — I10 Essential (primary) hypertension: Secondary | ICD-10-CM | POA: Diagnosis not present

## 2018-07-27 DIAGNOSIS — Z95810 Presence of automatic (implantable) cardiac defibrillator: Secondary | ICD-10-CM | POA: Diagnosis not present

## 2018-07-27 DIAGNOSIS — I482 Chronic atrial fibrillation: Secondary | ICD-10-CM | POA: Diagnosis not present

## 2018-07-29 DIAGNOSIS — I482 Chronic atrial fibrillation: Secondary | ICD-10-CM | POA: Diagnosis not present

## 2018-08-10 ENCOUNTER — Emergency Department
Admission: EM | Admit: 2018-08-10 | Discharge: 2018-08-10 | Disposition: A | Discharge status: Home / Self Care | Payer: PPO | Attending: Emergency Medicine | Admitting: Emergency Medicine

## 2018-08-10 ENCOUNTER — Other Ambulatory Visit: Payer: Self-pay

## 2018-08-10 ENCOUNTER — Emergency Department: Payer: PPO

## 2018-08-10 DIAGNOSIS — R55 Syncope and collapse: Secondary | ICD-10-CM

## 2018-08-10 DIAGNOSIS — E860 Dehydration: Secondary | ICD-10-CM | POA: Insufficient documentation

## 2018-08-10 DIAGNOSIS — Z79899 Other long term (current) drug therapy: Secondary | ICD-10-CM | POA: Diagnosis not present

## 2018-08-10 DIAGNOSIS — I509 Heart failure, unspecified: Secondary | ICD-10-CM | POA: Diagnosis not present

## 2018-08-10 DIAGNOSIS — Z87891 Personal history of nicotine dependence: Secondary | ICD-10-CM | POA: Diagnosis not present

## 2018-08-10 DIAGNOSIS — I951 Orthostatic hypotension: Secondary | ICD-10-CM | POA: Diagnosis not present

## 2018-08-10 DIAGNOSIS — I110 Hypertensive heart disease with heart failure: Secondary | ICD-10-CM | POA: Diagnosis not present

## 2018-08-10 DIAGNOSIS — E876 Hypokalemia: Secondary | ICD-10-CM | POA: Insufficient documentation

## 2018-08-10 DIAGNOSIS — I517 Cardiomegaly: Secondary | ICD-10-CM | POA: Diagnosis not present

## 2018-08-10 DIAGNOSIS — Z7901 Long term (current) use of anticoagulants: Secondary | ICD-10-CM | POA: Insufficient documentation

## 2018-08-10 DIAGNOSIS — I959 Hypotension, unspecified: Secondary | ICD-10-CM | POA: Diagnosis present

## 2018-08-10 LAB — COMPREHENSIVE METABOLIC PANEL
ALBUMIN: 3 g/dL — AB (ref 3.5–5.0)
ALT: 10 U/L (ref 0–44)
AST: 18 U/L (ref 15–41)
Alkaline Phosphatase: 37 U/L — ABNORMAL LOW (ref 38–126)
Anion gap: 7 (ref 5–15)
BUN: 27 mg/dL — AB (ref 8–23)
CO2: 29 mmol/L (ref 22–32)
CREATININE: 1.02 mg/dL (ref 0.61–1.24)
Calcium: 7.9 mg/dL — ABNORMAL LOW (ref 8.9–10.3)
Chloride: 106 mmol/L (ref 98–111)
GFR calc Af Amer: >60 mL/min (ref >60)
GFR calc non Af Amer: >60 mL/min (ref >60)
GLUCOSE: 83 mg/dL (ref 70–99)
POTASSIUM: 2.7 mmol/L — AB (ref 3.5–5.1)
Sodium: 142 mmol/L (ref 135–145)
Total Bilirubin: 1.1 mg/dL (ref 0.3–1.2)
Total Protein: 5 g/dL — ABNORMAL LOW (ref 6.5–8.1)

## 2018-08-10 LAB — CBC WITH DIFFERENTIAL/PLATELET
BASOS ABS: 0 10*3/uL (ref 0–0.1)
Basophils Relative: 1 %
Eosinophils Absolute: 0.1 10*3/uL (ref 0–0.7)
Eosinophils Relative: 4 %
HEMATOCRIT: 41.2 % (ref 40.0–52.0)
HEMOGLOBIN: 13.9 g/dL (ref 13.0–18.0)
LYMPHS ABS: 1 10*3/uL (ref 1.0–3.6)
LYMPHS PCT: 27 %
MCH: 29 pg (ref 26.0–34.0)
MCHC: 33.8 g/dL (ref 32.0–36.0)
MCV: 85.8 fL (ref 80.0–100.0)
MONOS PCT: 13 %
Monocytes Absolute: 0.5 10*3/uL (ref 0.2–1.0)
Neutro Abs: 2.1 10*3/uL (ref 1.4–6.5)
Neutrophils Relative %: 55 %
Platelets: 132 10*3/uL — ABNORMAL LOW (ref 150–440)
RBC: 4.8 MIL/uL (ref 4.40–5.90)
RDW: 19.3 % — ABNORMAL HIGH (ref 11.5–14.5)
WBC: 3.8 10*3/uL (ref 3.8–10.6)

## 2018-08-10 LAB — URINALYSIS, COMPLETE (UACMP) WITH MICROSCOPIC
BACTERIA UA: NONE SEEN
Bilirubin Urine: NEGATIVE
Glucose, UA: NEGATIVE mg/dL
Hgb urine dipstick: NEGATIVE
KETONES UR: NEGATIVE mg/dL
Leukocytes, UA: NEGATIVE
Nitrite: NEGATIVE
Protein, ur: NEGATIVE mg/dL
Specific Gravity, Urine: 1.009 (ref 1.005–1.030)
Squamous Epithelial / LPF: NONE SEEN (ref 0–5)
pH: 6 (ref 5.0–8.0)

## 2018-08-10 LAB — PROTIME-INR
INR: 3.05
Prothrombin Time: 31.3 seconds — ABNORMAL HIGH (ref 11.4–15.2)

## 2018-08-10 MED ORDER — POTASSIUM CHLORIDE 10 MEQ/100ML IV SOLN
10.0000 meq | INTRAVENOUS | Status: AC
  Administered 2018-08-10 (×2): 10 meq via INTRAVENOUS
  Filled 2018-08-10 (×2): qty 100

## 2018-08-10 MED ORDER — POTASSIUM CHLORIDE CRYS ER 20 MEQ PO TBCR
40.0000 meq | EXTENDED_RELEASE_TABLET | Freq: Once | ORAL | Status: AC
  Administered 2018-08-10: 40 meq via ORAL
  Filled 2018-08-10: qty 2

## 2018-08-10 NOTE — ED Notes (Addendum)
Pt c/o pain to IV site. Swelling noted to left AC. IV removed. Ice pack applied. MD Lord notified.

## 2018-08-10 NOTE — Discharge Instructions (Addendum)
You are evaluated for passing out and found to have low blood pressure upon standing consistent with dehydration which I suspect is due to your Demadex.  You are also found to have low potassium which I suspect is a side effect of Demedex as well.  You were given IV fluids in the emergency department until blood pressure improved.  You were also given potassium supplement.  Return to the emerge department immediately for any worsening condition including passing out, chest pain, fever, or any other symptoms concerning to you.  Please take Demedex once daily and call your prescribing doctor this week to discuss ongoing dose -- either going back to twice daily or stay at once daily.  Continue potassium supplement.

## 2018-08-10 NOTE — ED Notes (Addendum)
Remainder 519mL of NS infusing through pts Lt AC

## 2018-08-10 NOTE — ED Notes (Signed)
1L NS given in pts Lt AC per verbal order by MD Reita Cliche. Instructed to give 558mL check orthostatic VS

## 2018-08-10 NOTE — ED Provider Notes (Signed)
[facility] Emergency Department Provider Note ____________________________________________   I have reviewed the triage vital signs and the triage nursing note.  HISTORY  Chief Complaint Hypotension   Historian Patient  HPI Adrian Chavez is a 77 y.o. male with CHF, hypertension, and a pacemaker who presents with syncope at work.  No reported chest pain.  No recent volume losses such as vomiting or diarrhea.  He states that he is on a fluid pill and this is happened before when he got dehydrated from the fluid pill.  States that he did have a lot of urine output over the weekend.  No traumatic injury from syncope.  States he feels okay right now.  Symptoms mild to moderate.  Nothing makes it worse or better.     Past Medical History:  Diagnosis Date  . Anxiety   . CHF (congestive heart failure) ([place])   . Claustrophobia   . Colitis, ischemic ([place]) 2010   developed DVT  . DVT (deep venous thrombosis) ([place]) 2010   stomach and leg  . Dysrhythmia    A-Fib  . Hypertension     There are no active problems to display for this patient.   Past Surgical History:  Procedure Laterality Date  . HERNIA REPAIR Right 2007  . IMPLANTABLE CARDIOVERTER DEFIBRILLATOR (ICD) GENERATOR CHANGE Left 02/21/2016   Procedure: ICD GENERATOR CHANGE;  Surgeon: Isaias Cowman, MD;  Location: [facility] ORS;  Service: Cardiovascular;  Laterality: Left;  . PACEMAKER INSERTION    . PITUITARY SURGERY  2002   Done at [facility]    Prior to Admission medications   Medication Sig Start Date End Date Taking? Authorizing Provider  ALPRAZolam Duanne Moron) 0.5 MG tablet Take 0.5 mg by mouth daily as needed for anxiety.    Yes [provider]  carvedilol (COREG) 3.125 MG tablet Take 3.125 mg by mouth 2 (two) times daily.   Yes [provider]  CIALIS 5 MG tablet Take 5 mg by mouth daily.    Yes [provider]  fluticasone (FLONASE) 50 MCG/ACT nasal spray Place 2 sprays  into both nostrils daily as needed for allergies or rhinitis.   Yes [provider]  metolazone (ZAROXOLYN) 5 MG tablet Take 5 mg by mouth daily as needed (for swelling).   Yes [provider]  potassium chloride SA (K-DUR,KLOR-CON) 20 MEQ tablet Take 20 mEq by mouth 2 (two) times daily.   Yes [provider]  sacubitril-valsartan (ENTRESTO) 24-26 MG Take 1 tablet by mouth 2 (two) times daily.   Yes [provider]  torsemide (DEMADEX) 20 MG tablet Take 20 mg by mouth 2 (two) times daily.   Yes [provider]  vitamin B-12 (CYANOCOBALAMIN) 1000 MCG tablet Take 1,000 mcg by mouth daily.   Yes [provider]  warfarin (COUMADIN) 1 MG tablet Take 3-4 mg by mouth See admin instructions. Take 3MG  by mouth every Monday, Tuesday, Wednesday, Thursday and Friday evening and 4MG  by mouth every Saturday and Sunday evening   Yes [provider]    Allergies  Allergen Reactions  . Oxycodone     vomiting    History reviewed. No pertinent family history.  Social History Social History   Tobacco Use  . Smoking status: Former Smoker    Last attempt to quit: 02/13/1974    Years since quitting: 44.5  . Smokeless tobacco: Never Used  Substance Use Topics  . Alcohol use: No  . Drug use: No    Review of  Systems  Constitutional: Negative for fever. Eyes: Negative for visual changes. ENT: Negative for sore throat. Cardiovascular: Negative for chest pain. Respiratory: Negative for shortness of breath. Gastrointestinal: Negative for abdominal pain, vomiting and diarrhea. Genitourinary: States that after the weekend he has had a little bit of dysuria.  No hematuria. Musculoskeletal: Negative for back pain. Skin: Negative for rash. Neurological: Negative for headache.  ____________________________________________   PHYSICAL EXAM:  VITAL SIGNS: ED Triage Vitals  Enc Vitals Group     BP 08/10/18 1113 (!) 86/71     Pulse Rate  08/10/18 1113 88     Resp 08/10/18 1113 18     Temp 08/10/18 1113 97.8 F (36.6 C)     Temp Source 08/10/18 1113 Oral     SpO2 08/10/18 1113 95 %     Weight 08/10/18 1114 175 lb 9.6 oz (79.7 kg)     Height 08/10/18 1114 5\' 7"  (1.702 m)     Head Circumference --      Peak Flow --      Pain Score 08/10/18 1113 0     Pain Loc --      Pain Edu? --      Excl. in [place]? --      Constitutional: Alert and oriented.  HEENT      Head: Normocephalic and atraumatic.      Eyes: Conjunctivae are normal. Pupils equal and round.       Ears:         Nose: No congestion/rhinnorhea.      Mouth/Throat: Mucous membranes are mildly dry.      Neck: No stridor. Cardiovascular/Chest: Normal rate, regular rhythm.  No murmurs, rubs, or gallops. Respiratory: Normal respiratory effort without tachypnea nor retractions. Breath sounds are clear and equal bilaterally. No wheezes/rales/rhonchi. Gastrointestinal: Soft. No distention, no guarding, no rebound. Nontender.    Genitourinary/rectal:Deferred Musculoskeletal: Nontender with normal range of motion in all extremities. No joint effusions.  No lower extremity tenderness.  No edema. Neurologic:  Normal speech and language. No gross or focal neurologic deficits are appreciated. Skin:  Skin is warm, dry and intact. No rash noted. Psychiatric: Mood and affect are normal. Speech and behavior are normal. Patient exhibits appropriate insight and judgment.   ____________________________________________  LABS (pertinent positives/negatives) I, Lisa Roca, MD the attending physician have reviewed the labs noted below.  Labs Reviewed  URINALYSIS, COMPLETE (UACMP) WITH MICROSCOPIC - Abnormal; Notable for the following components:      Result Value   Color, Urine YELLOW (*)    APPearance CLEAR (*)    All other components within normal limits  COMPREHENSIVE METABOLIC PANEL - Abnormal; Notable for the following components:   Potassium 2.7 (*)    BUN 27 (*)     Calcium 7.9 (*)    Total Protein 5.0 (*)    Albumin 3.0 (*)    Alkaline Phosphatase 37 (*)    All other components within normal limits  CBC WITH DIFFERENTIAL/PLATELET - Abnormal; Notable for the following components:   RDW 19.3 (*)    Platelets 132 (*)    All other components within normal limits  PROTIME-INR - Abnormal; Notable for the following components:   Prothrombin Time 31.3 (*)    All other components within normal limits    ____________________________________________    EKG I, Lisa Roca, MD, the attending physician have personally viewed and interpreted all ECGs.  Heart rate 80.  atrially sensed ventricularly paced rhythm.  Occasional PVC. ____________________________________________  [id number]  Chest x-ray portable viewed by me, I reviewed radiologist interpretation:  IMPRESSION: No acute cardiopulmonary disease.  Stable cardiomegaly. __________________________________________  PROCEDURES  Procedure(s) performed: None  Procedures  Critical Care performed: None   ____________________________________________  ED COURSE / ASSESSMENT AND PLAN  Pertinent labs & imaging results that were available during my care of the patient were reviewed by me and considered in my medical decision making (see chart for details).    Patient presents with single episode, and hypotension here without symptoms of infection or any cardiac symptoms such as chest pain or trouble breathing.  It sounds like the most likely clinical scenario is volume depletion secondary to patient is on a diuretic.  He states this is happened before.  Spoke with the [employer] rep regarding interrogation today, he states that device had not been interrogated since 20 17/2018.  Device appeared to be functioning properly.  The patient had some high rates detected into the 130s to 160s, over the past months, with the last detection of that kind being October 5.  I will make sure that he is referred  back to his cardiologist, Dr. Saralyn Pilar.   After IVF, no longer hypotensive or orthostatic.  Discussed return precautions.    CONSULTATIONS:   None   Patient / Family / Caregiver informed of clinical course, medical decision-making process, and agree with plan.   I discussed return precautions, follow-up instructions, and discharge instructions with patient and/or family.  Discharge Instructions : You are evaluated for passing out and found to have low blood pressure upon standing consistent with dehydration which I suspect is due to your Demadex.  You are also found to have low potassium which I suspect is a side effect of Demedex as well.  You were given IV fluids in the emergency department until blood pressure improved.  You were also given potassium supplement.  Return to the emerge department immediately for any worsening condition including passing out, chest pain, fever, or any other symptoms concerning to you.  Please take Demedex once daily and call your prescribing doctor this week to discuss ongoing dose -- either going back to twice daily or stay at once daily.  Continue potassium supplement.    ___________________________________________   FINAL CLINICAL IMPRESSION(S) / ED DIAGNOSES   Final diagnoses:  Hypokalemia  Dehydration  Syncope, unspecified syncope type  Orthostatic hypotension      ___________________________________________         Note: This dictation was prepared with Dragon dictation. Any transcriptional errors that result from this process are unintentional    Lisa Roca, MD 08/10/18 [phone]

## 2018-08-10 NOTE — ED Notes (Addendum)
MD Lord at bedside. Explained to patient to apply ice and elevate extremity.

## 2018-08-10 NOTE — ED Triage Notes (Signed)
Pt arrived via EMS after a syncopal episode at work. On arrival, EMS states that he was pale he received 1L NS en route and was placed in trendelenburg position. Denies chest pain.

## 2018-08-12 DIAGNOSIS — I428 Other cardiomyopathies: Secondary | ICD-10-CM | POA: Diagnosis not present

## 2018-08-12 DIAGNOSIS — I482 Chronic atrial fibrillation, unspecified: Secondary | ICD-10-CM | POA: Diagnosis not present

## 2018-08-12 DIAGNOSIS — I10 Essential (primary) hypertension: Secondary | ICD-10-CM | POA: Diagnosis not present

## 2018-08-12 DIAGNOSIS — Z95810 Presence of automatic (implantable) cardiac defibrillator: Secondary | ICD-10-CM | POA: Diagnosis not present

## 2018-08-12 DIAGNOSIS — I5022 Chronic systolic (congestive) heart failure: Secondary | ICD-10-CM | POA: Diagnosis not present

## 2018-08-19 DIAGNOSIS — Z136 Encounter for screening for cardiovascular disorders: Secondary | ICD-10-CM | POA: Diagnosis not present

## 2018-08-19 DIAGNOSIS — I482 Chronic atrial fibrillation, unspecified: Secondary | ICD-10-CM | POA: Diagnosis not present

## 2018-08-19 DIAGNOSIS — R791 Abnormal coagulation profile: Secondary | ICD-10-CM | POA: Diagnosis not present

## 2018-08-19 DIAGNOSIS — N401 Enlarged prostate with lower urinary tract symptoms: Secondary | ICD-10-CM | POA: Diagnosis not present

## 2018-08-26 DIAGNOSIS — D61818 Other pancytopenia: Secondary | ICD-10-CM | POA: Diagnosis not present

## 2018-08-26 DIAGNOSIS — Z0000 Encounter for general adult medical examination without abnormal findings: Secondary | ICD-10-CM | POA: Diagnosis not present

## 2018-08-26 DIAGNOSIS — Z23 Encounter for immunization: Secondary | ICD-10-CM | POA: Diagnosis not present

## 2018-08-26 DIAGNOSIS — I428 Other cardiomyopathies: Secondary | ICD-10-CM | POA: Diagnosis not present

## 2018-09-01 DIAGNOSIS — D485 Neoplasm of uncertain behavior of skin: Secondary | ICD-10-CM | POA: Diagnosis not present

## 2018-09-01 DIAGNOSIS — Z08 Encounter for follow-up examination after completed treatment for malignant neoplasm: Secondary | ICD-10-CM | POA: Diagnosis not present

## 2018-09-01 DIAGNOSIS — D0462 Carcinoma in situ of skin of left upper limb, including shoulder: Secondary | ICD-10-CM | POA: Diagnosis not present

## 2018-09-01 DIAGNOSIS — X32XXXA Exposure to sunlight, initial encounter: Secondary | ICD-10-CM | POA: Diagnosis not present

## 2018-09-01 DIAGNOSIS — L821 Other seborrheic keratosis: Secondary | ICD-10-CM | POA: Diagnosis not present

## 2018-09-01 DIAGNOSIS — L570 Actinic keratosis: Secondary | ICD-10-CM | POA: Diagnosis not present

## 2018-09-01 DIAGNOSIS — Z85828 Personal history of other malignant neoplasm of skin: Secondary | ICD-10-CM | POA: Diagnosis not present

## 2018-09-03 DIAGNOSIS — I482 Chronic atrial fibrillation, unspecified: Secondary | ICD-10-CM | POA: Diagnosis not present

## 2018-09-03 DIAGNOSIS — I428 Other cardiomyopathies: Secondary | ICD-10-CM | POA: Diagnosis not present

## 2018-09-10 DIAGNOSIS — R791 Abnormal coagulation profile: Secondary | ICD-10-CM | POA: Diagnosis not present

## 2018-09-18 DIAGNOSIS — D0462 Carcinoma in situ of skin of left upper limb, including shoulder: Secondary | ICD-10-CM | POA: Diagnosis not present

## 2018-09-24 DIAGNOSIS — R791 Abnormal coagulation profile: Secondary | ICD-10-CM | POA: Diagnosis not present

## 2018-09-24 DIAGNOSIS — I482 Chronic atrial fibrillation, unspecified: Secondary | ICD-10-CM | POA: Diagnosis not present

## 2018-10-08 DIAGNOSIS — R791 Abnormal coagulation profile: Secondary | ICD-10-CM | POA: Diagnosis not present

## 2018-10-08 DIAGNOSIS — I482 Chronic atrial fibrillation, unspecified: Secondary | ICD-10-CM | POA: Diagnosis not present

## 2018-10-13 DIAGNOSIS — I480 Paroxysmal atrial fibrillation: Secondary | ICD-10-CM | POA: Diagnosis not present

## 2018-10-23 DIAGNOSIS — R791 Abnormal coagulation profile: Secondary | ICD-10-CM | POA: Diagnosis not present

## 2018-10-23 DIAGNOSIS — I482 Chronic atrial fibrillation, unspecified: Secondary | ICD-10-CM | POA: Diagnosis not present

## 2018-11-05 DIAGNOSIS — R791 Abnormal coagulation profile: Secondary | ICD-10-CM | POA: Diagnosis not present

## 2018-11-10 DIAGNOSIS — I5022 Chronic systolic (congestive) heart failure: Secondary | ICD-10-CM | POA: Diagnosis not present

## 2018-11-10 DIAGNOSIS — I482 Chronic atrial fibrillation, unspecified: Secondary | ICD-10-CM | POA: Diagnosis not present

## 2018-11-10 DIAGNOSIS — I428 Other cardiomyopathies: Secondary | ICD-10-CM | POA: Diagnosis not present

## 2018-11-10 DIAGNOSIS — I10 Essential (primary) hypertension: Secondary | ICD-10-CM | POA: Diagnosis not present

## 2018-11-10 DIAGNOSIS — Z95810 Presence of automatic (implantable) cardiac defibrillator: Secondary | ICD-10-CM | POA: Diagnosis not present

## 2018-11-19 DIAGNOSIS — R791 Abnormal coagulation profile: Secondary | ICD-10-CM | POA: Diagnosis not present

## 2018-11-30 DIAGNOSIS — M1A00X0 Idiopathic chronic gout, unspecified site, without tophus (tophi): Secondary | ICD-10-CM | POA: Diagnosis not present

## 2018-11-30 DIAGNOSIS — G8929 Other chronic pain: Secondary | ICD-10-CM | POA: Diagnosis not present

## 2018-11-30 DIAGNOSIS — M25512 Pain in left shoulder: Secondary | ICD-10-CM | POA: Diagnosis not present

## 2018-12-03 DIAGNOSIS — R791 Abnormal coagulation profile: Secondary | ICD-10-CM | POA: Diagnosis not present

## 2018-12-17 DIAGNOSIS — R791 Abnormal coagulation profile: Secondary | ICD-10-CM | POA: Diagnosis not present

## 2018-12-31 DIAGNOSIS — R791 Abnormal coagulation profile: Secondary | ICD-10-CM | POA: Diagnosis not present

## 2018-12-31 DIAGNOSIS — I482 Chronic atrial fibrillation, unspecified: Secondary | ICD-10-CM | POA: Diagnosis not present

## 2018-12-31 DIAGNOSIS — M1A00X0 Idiopathic chronic gout, unspecified site, without tophus (tophi): Secondary | ICD-10-CM | POA: Diagnosis not present

## 2019-01-08 DIAGNOSIS — J209 Acute bronchitis, unspecified: Secondary | ICD-10-CM | POA: Diagnosis not present

## 2019-01-12 DIAGNOSIS — I5022 Chronic systolic (congestive) heart failure: Secondary | ICD-10-CM | POA: Diagnosis not present

## 2019-01-22 DIAGNOSIS — Z79899 Other long term (current) drug therapy: Secondary | ICD-10-CM | POA: Diagnosis not present

## 2019-01-22 DIAGNOSIS — I482 Chronic atrial fibrillation, unspecified: Secondary | ICD-10-CM | POA: Diagnosis not present

## 2019-02-05 DIAGNOSIS — I5022 Chronic systolic (congestive) heart failure: Secondary | ICD-10-CM | POA: Diagnosis not present

## 2019-02-05 DIAGNOSIS — I482 Chronic atrial fibrillation, unspecified: Secondary | ICD-10-CM | POA: Diagnosis not present

## 2019-02-05 DIAGNOSIS — L03039 Cellulitis of unspecified toe: Secondary | ICD-10-CM | POA: Diagnosis not present

## 2019-02-05 DIAGNOSIS — M6281 Muscle weakness (generalized): Secondary | ICD-10-CM | POA: Diagnosis not present

## 2019-02-05 DIAGNOSIS — E0869 Diabetes mellitus due to underlying condition with other specified complication: Secondary | ICD-10-CM | POA: Diagnosis not present

## 2019-02-05 DIAGNOSIS — H832X3 Labyrinthine dysfunction, bilateral: Secondary | ICD-10-CM | POA: Diagnosis not present

## 2019-02-05 DIAGNOSIS — R42 Dizziness and giddiness: Secondary | ICD-10-CM | POA: Diagnosis not present

## 2019-03-02 DIAGNOSIS — Z8739 Personal history of other diseases of the musculoskeletal system and connective tissue: Secondary | ICD-10-CM | POA: Diagnosis not present

## 2019-03-11 DIAGNOSIS — I428 Other cardiomyopathies: Secondary | ICD-10-CM | POA: Diagnosis not present

## 2019-03-11 DIAGNOSIS — D61818 Other pancytopenia: Secondary | ICD-10-CM | POA: Diagnosis not present

## 2019-03-11 DIAGNOSIS — I482 Chronic atrial fibrillation, unspecified: Secondary | ICD-10-CM | POA: Diagnosis not present

## 2019-03-11 DIAGNOSIS — I5022 Chronic systolic (congestive) heart failure: Secondary | ICD-10-CM | POA: Diagnosis not present

## 2019-03-11 DIAGNOSIS — Z95810 Presence of automatic (implantable) cardiac defibrillator: Secondary | ICD-10-CM | POA: Diagnosis not present

## 2019-03-11 DIAGNOSIS — I10 Essential (primary) hypertension: Secondary | ICD-10-CM | POA: Diagnosis not present

## 2019-03-26 DIAGNOSIS — Z0000 Encounter for general adult medical examination without abnormal findings: Secondary | ICD-10-CM | POA: Diagnosis not present

## 2019-03-26 DIAGNOSIS — Z8739 Personal history of other diseases of the musculoskeletal system and connective tissue: Secondary | ICD-10-CM | POA: Diagnosis not present

## 2019-03-26 DIAGNOSIS — I482 Chronic atrial fibrillation, unspecified: Secondary | ICD-10-CM | POA: Diagnosis not present

## 2019-03-26 DIAGNOSIS — I10 Essential (primary) hypertension: Secondary | ICD-10-CM | POA: Diagnosis not present

## 2019-03-26 DIAGNOSIS — F411 Generalized anxiety disorder: Secondary | ICD-10-CM | POA: Diagnosis not present

## 2019-03-26 DIAGNOSIS — Z136 Encounter for screening for cardiovascular disorders: Secondary | ICD-10-CM | POA: Diagnosis not present

## 2019-04-09 DIAGNOSIS — I482 Chronic atrial fibrillation, unspecified: Secondary | ICD-10-CM | POA: Diagnosis not present

## 2019-04-27 DIAGNOSIS — I5022 Chronic systolic (congestive) heart failure: Secondary | ICD-10-CM | POA: Diagnosis not present

## 2019-05-10 DIAGNOSIS — I482 Chronic atrial fibrillation, unspecified: Secondary | ICD-10-CM | POA: Diagnosis not present

## 2019-05-14 DIAGNOSIS — D485 Neoplasm of uncertain behavior of skin: Secondary | ICD-10-CM | POA: Diagnosis not present

## 2019-05-14 DIAGNOSIS — L570 Actinic keratosis: Secondary | ICD-10-CM | POA: Diagnosis not present

## 2019-05-14 DIAGNOSIS — Z08 Encounter for follow-up examination after completed treatment for malignant neoplasm: Secondary | ICD-10-CM | POA: Diagnosis not present

## 2019-05-14 DIAGNOSIS — D235 Other benign neoplasm of skin of trunk: Secondary | ICD-10-CM | POA: Diagnosis not present

## 2019-05-14 DIAGNOSIS — L821 Other seborrheic keratosis: Secondary | ICD-10-CM | POA: Diagnosis not present

## 2019-05-14 DIAGNOSIS — D0422 Carcinoma in situ of skin of left ear and external auricular canal: Secondary | ICD-10-CM | POA: Diagnosis not present

## 2019-05-14 DIAGNOSIS — L820 Inflamed seborrheic keratosis: Secondary | ICD-10-CM | POA: Diagnosis not present

## 2019-05-14 DIAGNOSIS — L538 Other specified erythematous conditions: Secondary | ICD-10-CM | POA: Diagnosis not present

## 2019-05-14 DIAGNOSIS — C44622 Squamous cell carcinoma of skin of right upper limb, including shoulder: Secondary | ICD-10-CM | POA: Diagnosis not present

## 2019-05-14 DIAGNOSIS — Z85828 Personal history of other malignant neoplasm of skin: Secondary | ICD-10-CM | POA: Diagnosis not present

## 2019-05-14 DIAGNOSIS — X32XXXA Exposure to sunlight, initial encounter: Secondary | ICD-10-CM | POA: Diagnosis not present

## 2019-06-07 DIAGNOSIS — M1A00X0 Idiopathic chronic gout, unspecified site, without tophus (tophi): Secondary | ICD-10-CM | POA: Diagnosis not present

## 2019-06-07 DIAGNOSIS — M25512 Pain in left shoulder: Secondary | ICD-10-CM | POA: Diagnosis not present

## 2019-06-07 DIAGNOSIS — G8929 Other chronic pain: Secondary | ICD-10-CM | POA: Diagnosis not present

## 2019-06-07 DIAGNOSIS — I482 Chronic atrial fibrillation, unspecified: Secondary | ICD-10-CM | POA: Diagnosis not present

## 2019-06-21 DIAGNOSIS — C44622 Squamous cell carcinoma of skin of right upper limb, including shoulder: Secondary | ICD-10-CM | POA: Diagnosis not present

## 2019-07-05 DIAGNOSIS — I482 Chronic atrial fibrillation, unspecified: Secondary | ICD-10-CM | POA: Diagnosis not present

## 2019-07-13 DIAGNOSIS — I10 Essential (primary) hypertension: Secondary | ICD-10-CM | POA: Diagnosis not present

## 2019-07-13 DIAGNOSIS — I428 Other cardiomyopathies: Secondary | ICD-10-CM | POA: Diagnosis not present

## 2019-07-13 DIAGNOSIS — Z95810 Presence of automatic (implantable) cardiac defibrillator: Secondary | ICD-10-CM | POA: Diagnosis not present

## 2019-07-13 DIAGNOSIS — I5022 Chronic systolic (congestive) heart failure: Secondary | ICD-10-CM | POA: Diagnosis not present

## 2019-07-13 DIAGNOSIS — I482 Chronic atrial fibrillation, unspecified: Secondary | ICD-10-CM | POA: Diagnosis not present

## 2019-07-20 DIAGNOSIS — I10 Essential (primary) hypertension: Secondary | ICD-10-CM | POA: Diagnosis not present

## 2019-07-20 DIAGNOSIS — I5022 Chronic systolic (congestive) heart failure: Secondary | ICD-10-CM | POA: Diagnosis not present

## 2019-07-20 DIAGNOSIS — I428 Other cardiomyopathies: Secondary | ICD-10-CM | POA: Diagnosis not present

## 2019-07-20 DIAGNOSIS — Z95810 Presence of automatic (implantable) cardiac defibrillator: Secondary | ICD-10-CM | POA: Diagnosis not present

## 2019-07-20 DIAGNOSIS — Z23 Encounter for immunization: Secondary | ICD-10-CM | POA: Diagnosis not present

## 2019-07-20 DIAGNOSIS — I482 Chronic atrial fibrillation, unspecified: Secondary | ICD-10-CM | POA: Diagnosis not present

## 2019-07-26 DIAGNOSIS — J069 Acute upper respiratory infection, unspecified: Secondary | ICD-10-CM | POA: Diagnosis not present

## 2019-07-26 DIAGNOSIS — I5022 Chronic systolic (congestive) heart failure: Secondary | ICD-10-CM | POA: Diagnosis not present

## 2019-07-26 DIAGNOSIS — R05 Cough: Secondary | ICD-10-CM | POA: Diagnosis not present

## 2019-07-26 DIAGNOSIS — I10 Essential (primary) hypertension: Secondary | ICD-10-CM | POA: Diagnosis not present

## 2019-07-26 DIAGNOSIS — I482 Chronic atrial fibrillation, unspecified: Secondary | ICD-10-CM | POA: Diagnosis not present

## 2019-08-02 DIAGNOSIS — I482 Chronic atrial fibrillation, unspecified: Secondary | ICD-10-CM | POA: Diagnosis not present

## 2019-08-09 DIAGNOSIS — I482 Chronic atrial fibrillation, unspecified: Secondary | ICD-10-CM | POA: Diagnosis not present

## 2019-08-23 DIAGNOSIS — I482 Chronic atrial fibrillation, unspecified: Secondary | ICD-10-CM | POA: Diagnosis not present

## 2019-08-24 DIAGNOSIS — H903 Sensorineural hearing loss, bilateral: Secondary | ICD-10-CM | POA: Diagnosis not present

## 2019-08-24 DIAGNOSIS — J301 Allergic rhinitis due to pollen: Secondary | ICD-10-CM | POA: Diagnosis not present

## 2019-08-24 DIAGNOSIS — H6123 Impacted cerumen, bilateral: Secondary | ICD-10-CM | POA: Diagnosis not present

## 2019-08-30 DIAGNOSIS — Z79899 Other long term (current) drug therapy: Secondary | ICD-10-CM | POA: Diagnosis not present

## 2019-08-30 DIAGNOSIS — I482 Chronic atrial fibrillation, unspecified: Secondary | ICD-10-CM | POA: Diagnosis not present

## 2019-09-01 DIAGNOSIS — I482 Chronic atrial fibrillation, unspecified: Secondary | ICD-10-CM | POA: Diagnosis not present

## 2019-09-01 DIAGNOSIS — F411 Generalized anxiety disorder: Secondary | ICD-10-CM | POA: Diagnosis not present

## 2019-09-01 DIAGNOSIS — I10 Essential (primary) hypertension: Secondary | ICD-10-CM | POA: Diagnosis not present

## 2019-09-13 DIAGNOSIS — I428 Other cardiomyopathies: Secondary | ICD-10-CM | POA: Diagnosis not present

## 2019-09-13 DIAGNOSIS — I10 Essential (primary) hypertension: Secondary | ICD-10-CM | POA: Diagnosis not present

## 2019-09-13 DIAGNOSIS — I4891 Unspecified atrial fibrillation: Secondary | ICD-10-CM | POA: Diagnosis not present

## 2019-09-13 DIAGNOSIS — Z95810 Presence of automatic (implantable) cardiac defibrillator: Secondary | ICD-10-CM | POA: Diagnosis not present

## 2019-09-13 DIAGNOSIS — I5022 Chronic systolic (congestive) heart failure: Secondary | ICD-10-CM | POA: Diagnosis not present

## 2019-09-14 DIAGNOSIS — I482 Chronic atrial fibrillation, unspecified: Secondary | ICD-10-CM | POA: Diagnosis not present

## 2019-09-14 DIAGNOSIS — Z136 Encounter for screening for cardiovascular disorders: Secondary | ICD-10-CM | POA: Diagnosis not present

## 2019-09-14 DIAGNOSIS — I10 Essential (primary) hypertension: Secondary | ICD-10-CM | POA: Diagnosis not present

## 2019-09-16 DIAGNOSIS — R791 Abnormal coagulation profile: Secondary | ICD-10-CM | POA: Diagnosis not present

## 2019-09-27 DIAGNOSIS — I10 Essential (primary) hypertension: Secondary | ICD-10-CM | POA: Diagnosis not present

## 2019-09-27 DIAGNOSIS — I4891 Unspecified atrial fibrillation: Secondary | ICD-10-CM | POA: Diagnosis not present

## 2019-09-27 DIAGNOSIS — R791 Abnormal coagulation profile: Secondary | ICD-10-CM | POA: Diagnosis not present

## 2019-09-27 DIAGNOSIS — I428 Other cardiomyopathies: Secondary | ICD-10-CM | POA: Diagnosis not present

## 2019-09-27 DIAGNOSIS — N1831 Chronic kidney disease, stage 3a: Secondary | ICD-10-CM | POA: Diagnosis not present

## 2019-09-27 DIAGNOSIS — F411 Generalized anxiety disorder: Secondary | ICD-10-CM | POA: Diagnosis not present

## 2019-09-27 DIAGNOSIS — I5022 Chronic systolic (congestive) heart failure: Secondary | ICD-10-CM | POA: Diagnosis not present

## 2019-09-27 DIAGNOSIS — Z95810 Presence of automatic (implantable) cardiac defibrillator: Secondary | ICD-10-CM | POA: Diagnosis not present

## 2019-10-04 DIAGNOSIS — R0982 Postnasal drip: Secondary | ICD-10-CM | POA: Diagnosis not present

## 2019-10-11 DIAGNOSIS — I482 Chronic atrial fibrillation, unspecified: Secondary | ICD-10-CM | POA: Diagnosis not present

## 2019-10-18 DIAGNOSIS — R791 Abnormal coagulation profile: Secondary | ICD-10-CM | POA: Diagnosis not present

## 2019-11-02 DIAGNOSIS — R05 Cough: Secondary | ICD-10-CM | POA: Diagnosis not present

## 2019-11-03 DIAGNOSIS — N1831 Chronic kidney disease, stage 3a: Secondary | ICD-10-CM | POA: Diagnosis not present

## 2019-11-03 DIAGNOSIS — F411 Generalized anxiety disorder: Secondary | ICD-10-CM | POA: Diagnosis not present

## 2019-11-03 DIAGNOSIS — I10 Essential (primary) hypertension: Secondary | ICD-10-CM | POA: Diagnosis not present

## 2019-11-03 DIAGNOSIS — R05 Cough: Secondary | ICD-10-CM | POA: Diagnosis not present

## 2019-11-03 DIAGNOSIS — I4891 Unspecified atrial fibrillation: Secondary | ICD-10-CM | POA: Diagnosis not present

## 2019-11-16 DIAGNOSIS — I5022 Chronic systolic (congestive) heart failure: Secondary | ICD-10-CM | POA: Diagnosis not present

## 2019-11-16 DIAGNOSIS — I482 Chronic atrial fibrillation, unspecified: Secondary | ICD-10-CM | POA: Diagnosis not present

## 2019-11-17 DIAGNOSIS — R350 Frequency of micturition: Secondary | ICD-10-CM | POA: Diagnosis not present

## 2019-11-19 DIAGNOSIS — R791 Abnormal coagulation profile: Secondary | ICD-10-CM | POA: Diagnosis not present

## 2019-11-22 ENCOUNTER — Other Ambulatory Visit
Admission: RE | Admit: 2019-11-22 | Discharge: 2019-11-22 | Disposition: A | Discharge status: Home / Self Care | Payer: PPO | Source: Ambulatory Visit | Attending: Physician Assistant | Admitting: Physician Assistant

## 2019-11-22 DIAGNOSIS — Z8616 Personal history of COVID-19: Secondary | ICD-10-CM | POA: Insufficient documentation

## 2019-11-22 DIAGNOSIS — Z95810 Presence of automatic (implantable) cardiac defibrillator: Secondary | ICD-10-CM | POA: Diagnosis not present

## 2019-11-22 DIAGNOSIS — I952 Hypotension due to drugs: Secondary | ICD-10-CM | POA: Diagnosis not present

## 2019-11-22 DIAGNOSIS — Z79899 Other long term (current) drug therapy: Secondary | ICD-10-CM | POA: Diagnosis not present

## 2019-11-22 DIAGNOSIS — R0902 Hypoxemia: Secondary | ICD-10-CM | POA: Diagnosis not present

## 2019-11-22 DIAGNOSIS — I5021 Acute systolic (congestive) heart failure: Secondary | ICD-10-CM | POA: Insufficient documentation

## 2019-11-22 DIAGNOSIS — I10 Essential (primary) hypertension: Secondary | ICD-10-CM | POA: Diagnosis not present

## 2019-11-22 DIAGNOSIS — F411 Generalized anxiety disorder: Secondary | ICD-10-CM | POA: Diagnosis not present

## 2019-11-22 DIAGNOSIS — R0602 Shortness of breath: Secondary | ICD-10-CM | POA: Diagnosis not present

## 2019-11-22 DIAGNOSIS — N1831 Chronic kidney disease, stage 3a: Secondary | ICD-10-CM | POA: Diagnosis not present

## 2019-11-22 DIAGNOSIS — I428 Other cardiomyopathies: Secondary | ICD-10-CM | POA: Diagnosis not present

## 2019-11-22 DIAGNOSIS — I4891 Unspecified atrial fibrillation: Secondary | ICD-10-CM | POA: Diagnosis not present

## 2019-11-22 HISTORY — DX: Personal history of COVID-19: Z86.16

## 2019-11-22 LAB — BRAIN NATRIURETIC PEPTIDE: B Natriuretic Peptide: >4500 pg/mL — ABNORMAL HIGH (ref 0.0–100.0)

## 2019-11-29 ENCOUNTER — Other Ambulatory Visit
Admission: RE | Admit: 2019-11-29 | Discharge: 2019-11-29 | Disposition: A | Discharge status: Home / Self Care | Payer: PPO | Source: Ambulatory Visit | Attending: Physician Assistant | Admitting: Physician Assistant

## 2019-11-29 DIAGNOSIS — I428 Other cardiomyopathies: Secondary | ICD-10-CM | POA: Diagnosis not present

## 2019-11-29 DIAGNOSIS — Z79899 Other long term (current) drug therapy: Secondary | ICD-10-CM | POA: Diagnosis not present

## 2019-11-29 DIAGNOSIS — I5021 Acute systolic (congestive) heart failure: Secondary | ICD-10-CM | POA: Diagnosis not present

## 2019-11-29 LAB — BRAIN NATRIURETIC PEPTIDE: B Natriuretic Peptide: 2646 pg/mL — ABNORMAL HIGH (ref 0.0–100.0)

## 2019-12-06 DIAGNOSIS — M25512 Pain in left shoulder: Secondary | ICD-10-CM | POA: Diagnosis not present

## 2019-12-06 DIAGNOSIS — M1A00X0 Idiopathic chronic gout, unspecified site, without tophus (tophi): Secondary | ICD-10-CM | POA: Diagnosis not present

## 2019-12-06 DIAGNOSIS — G8929 Other chronic pain: Secondary | ICD-10-CM | POA: Diagnosis not present

## 2019-12-06 DIAGNOSIS — R791 Abnormal coagulation profile: Secondary | ICD-10-CM | POA: Diagnosis not present

## 2019-12-13 DIAGNOSIS — R791 Abnormal coagulation profile: Secondary | ICD-10-CM | POA: Diagnosis not present

## 2019-12-17 ENCOUNTER — Ambulatory Visit: Payer: Self-pay | Admitting: Urology

## 2019-12-27 DIAGNOSIS — I482 Chronic atrial fibrillation, unspecified: Secondary | ICD-10-CM | POA: Diagnosis not present

## 2019-12-27 DIAGNOSIS — M1A00X0 Idiopathic chronic gout, unspecified site, without tophus (tophi): Secondary | ICD-10-CM | POA: Diagnosis not present

## 2019-12-28 DIAGNOSIS — F411 Generalized anxiety disorder: Secondary | ICD-10-CM | POA: Diagnosis not present

## 2019-12-28 DIAGNOSIS — I4891 Unspecified atrial fibrillation: Secondary | ICD-10-CM | POA: Diagnosis not present

## 2019-12-28 DIAGNOSIS — I10 Essential (primary) hypertension: Secondary | ICD-10-CM | POA: Diagnosis not present

## 2019-12-28 DIAGNOSIS — N1831 Chronic kidney disease, stage 3a: Secondary | ICD-10-CM | POA: Diagnosis not present

## 2020-01-03 DIAGNOSIS — R791 Abnormal coagulation profile: Secondary | ICD-10-CM | POA: Diagnosis not present

## 2020-01-18 DIAGNOSIS — H2513 Age-related nuclear cataract, bilateral: Secondary | ICD-10-CM | POA: Diagnosis not present

## 2020-01-18 DIAGNOSIS — I4891 Unspecified atrial fibrillation: Secondary | ICD-10-CM | POA: Diagnosis not present

## 2020-01-26 DIAGNOSIS — I5022 Chronic systolic (congestive) heart failure: Secondary | ICD-10-CM | POA: Diagnosis not present

## 2020-01-26 DIAGNOSIS — R0602 Shortness of breath: Secondary | ICD-10-CM | POA: Diagnosis not present

## 2020-01-26 DIAGNOSIS — Z95810 Presence of automatic (implantable) cardiac defibrillator: Secondary | ICD-10-CM | POA: Diagnosis not present

## 2020-01-26 DIAGNOSIS — I4891 Unspecified atrial fibrillation: Secondary | ICD-10-CM | POA: Diagnosis not present

## 2020-01-26 DIAGNOSIS — I428 Other cardiomyopathies: Secondary | ICD-10-CM | POA: Diagnosis not present

## 2020-01-26 DIAGNOSIS — N1831 Chronic kidney disease, stage 3a: Secondary | ICD-10-CM | POA: Diagnosis not present

## 2020-01-26 DIAGNOSIS — I10 Essential (primary) hypertension: Secondary | ICD-10-CM | POA: Diagnosis not present

## 2020-01-31 DIAGNOSIS — Z7901 Long term (current) use of anticoagulants: Secondary | ICD-10-CM | POA: Diagnosis not present

## 2020-02-23 DIAGNOSIS — M25512 Pain in left shoulder: Secondary | ICD-10-CM | POA: Diagnosis not present

## 2020-02-23 DIAGNOSIS — M542 Cervicalgia: Secondary | ICD-10-CM | POA: Diagnosis not present

## 2020-02-23 DIAGNOSIS — G8929 Other chronic pain: Secondary | ICD-10-CM | POA: Diagnosis not present

## 2020-02-28 DIAGNOSIS — I4891 Unspecified atrial fibrillation: Secondary | ICD-10-CM | POA: Diagnosis not present

## 2020-03-07 DIAGNOSIS — R791 Abnormal coagulation profile: Secondary | ICD-10-CM | POA: Diagnosis not present

## 2020-03-16 DIAGNOSIS — I4891 Unspecified atrial fibrillation: Secondary | ICD-10-CM | POA: Diagnosis not present

## 2020-03-20 DIAGNOSIS — I10 Essential (primary) hypertension: Secondary | ICD-10-CM | POA: Diagnosis not present

## 2020-03-20 DIAGNOSIS — Z136 Encounter for screening for cardiovascular disorders: Secondary | ICD-10-CM | POA: Diagnosis not present

## 2020-03-20 DIAGNOSIS — N1831 Chronic kidney disease, stage 3a: Secondary | ICD-10-CM | POA: Diagnosis not present

## 2020-03-20 DIAGNOSIS — I5022 Chronic systolic (congestive) heart failure: Secondary | ICD-10-CM | POA: Diagnosis not present

## 2020-03-27 DIAGNOSIS — I10 Essential (primary) hypertension: Secondary | ICD-10-CM | POA: Diagnosis not present

## 2020-03-27 DIAGNOSIS — Z0000 Encounter for general adult medical examination without abnormal findings: Secondary | ICD-10-CM | POA: Diagnosis not present

## 2020-03-27 DIAGNOSIS — D696 Thrombocytopenia, unspecified: Secondary | ICD-10-CM | POA: Diagnosis not present

## 2020-03-27 DIAGNOSIS — I4891 Unspecified atrial fibrillation: Secondary | ICD-10-CM | POA: Diagnosis not present

## 2020-03-29 DIAGNOSIS — X32XXXA Exposure to sunlight, initial encounter: Secondary | ICD-10-CM | POA: Diagnosis not present

## 2020-03-29 DIAGNOSIS — L570 Actinic keratosis: Secondary | ICD-10-CM | POA: Diagnosis not present

## 2020-03-29 DIAGNOSIS — L821 Other seborrheic keratosis: Secondary | ICD-10-CM | POA: Diagnosis not present

## 2020-03-29 DIAGNOSIS — D2261 Melanocytic nevi of right upper limb, including shoulder: Secondary | ICD-10-CM | POA: Diagnosis not present

## 2020-03-29 DIAGNOSIS — D485 Neoplasm of uncertain behavior of skin: Secondary | ICD-10-CM | POA: Diagnosis not present

## 2020-03-29 DIAGNOSIS — D2262 Melanocytic nevi of left upper limb, including shoulder: Secondary | ICD-10-CM | POA: Diagnosis not present

## 2020-03-29 DIAGNOSIS — D2271 Melanocytic nevi of right lower limb, including hip: Secondary | ICD-10-CM | POA: Diagnosis not present

## 2020-03-29 DIAGNOSIS — Z85828 Personal history of other malignant neoplasm of skin: Secondary | ICD-10-CM | POA: Diagnosis not present

## 2020-03-29 DIAGNOSIS — C44629 Squamous cell carcinoma of skin of left upper limb, including shoulder: Secondary | ICD-10-CM | POA: Diagnosis not present

## 2020-03-30 DIAGNOSIS — I4891 Unspecified atrial fibrillation: Secondary | ICD-10-CM | POA: Diagnosis not present

## 2020-04-10 DIAGNOSIS — H1131 Conjunctival hemorrhage, right eye: Secondary | ICD-10-CM | POA: Diagnosis not present

## 2020-04-25 DIAGNOSIS — C44629 Squamous cell carcinoma of skin of left upper limb, including shoulder: Secondary | ICD-10-CM | POA: Diagnosis not present

## 2020-04-25 DIAGNOSIS — L570 Actinic keratosis: Secondary | ICD-10-CM | POA: Diagnosis not present

## 2020-04-27 DIAGNOSIS — I428 Other cardiomyopathies: Secondary | ICD-10-CM | POA: Diagnosis not present

## 2020-04-27 DIAGNOSIS — I4891 Unspecified atrial fibrillation: Secondary | ICD-10-CM | POA: Diagnosis not present

## 2020-04-27 DIAGNOSIS — I5022 Chronic systolic (congestive) heart failure: Secondary | ICD-10-CM | POA: Diagnosis not present

## 2020-04-27 DIAGNOSIS — Z95810 Presence of automatic (implantable) cardiac defibrillator: Secondary | ICD-10-CM | POA: Diagnosis not present

## 2020-04-27 DIAGNOSIS — I10 Essential (primary) hypertension: Secondary | ICD-10-CM | POA: Diagnosis not present

## 2020-05-01 DIAGNOSIS — I4891 Unspecified atrial fibrillation: Secondary | ICD-10-CM | POA: Diagnosis not present

## 2020-05-09 DIAGNOSIS — R791 Abnormal coagulation profile: Secondary | ICD-10-CM | POA: Diagnosis not present

## 2020-05-16 DIAGNOSIS — R791 Abnormal coagulation profile: Secondary | ICD-10-CM | POA: Diagnosis not present

## 2020-05-16 DIAGNOSIS — I5022 Chronic systolic (congestive) heart failure: Secondary | ICD-10-CM | POA: Diagnosis not present

## 2020-05-29 DIAGNOSIS — R0982 Postnasal drip: Secondary | ICD-10-CM | POA: Diagnosis not present

## 2020-05-29 DIAGNOSIS — R05 Cough: Secondary | ICD-10-CM | POA: Diagnosis not present

## 2020-06-02 DIAGNOSIS — I5022 Chronic systolic (congestive) heart failure: Secondary | ICD-10-CM | POA: Diagnosis not present

## 2020-06-02 DIAGNOSIS — Z95810 Presence of automatic (implantable) cardiac defibrillator: Secondary | ICD-10-CM | POA: Diagnosis not present

## 2020-06-02 DIAGNOSIS — I10 Essential (primary) hypertension: Secondary | ICD-10-CM | POA: Diagnosis not present

## 2020-06-02 DIAGNOSIS — I428 Other cardiomyopathies: Secondary | ICD-10-CM | POA: Diagnosis not present

## 2020-06-02 DIAGNOSIS — R5383 Other fatigue: Secondary | ICD-10-CM | POA: Diagnosis not present

## 2020-06-02 DIAGNOSIS — D696 Thrombocytopenia, unspecified: Secondary | ICD-10-CM | POA: Diagnosis not present

## 2020-06-02 DIAGNOSIS — I4891 Unspecified atrial fibrillation: Secondary | ICD-10-CM | POA: Diagnosis not present

## 2020-07-03 DIAGNOSIS — I4891 Unspecified atrial fibrillation: Secondary | ICD-10-CM | POA: Diagnosis not present

## 2020-07-19 DIAGNOSIS — R791 Abnormal coagulation profile: Secondary | ICD-10-CM | POA: Diagnosis not present

## 2020-08-17 DIAGNOSIS — R791 Abnormal coagulation profile: Secondary | ICD-10-CM | POA: Diagnosis not present

## 2020-08-29 DIAGNOSIS — L821 Other seborrheic keratosis: Secondary | ICD-10-CM | POA: Diagnosis not present

## 2020-08-29 DIAGNOSIS — D485 Neoplasm of uncertain behavior of skin: Secondary | ICD-10-CM | POA: Diagnosis not present

## 2020-08-29 DIAGNOSIS — D225 Melanocytic nevi of trunk: Secondary | ICD-10-CM | POA: Diagnosis not present

## 2020-08-29 DIAGNOSIS — D2261 Melanocytic nevi of right upper limb, including shoulder: Secondary | ICD-10-CM | POA: Diagnosis not present

## 2020-08-29 DIAGNOSIS — D2271 Melanocytic nevi of right lower limb, including hip: Secondary | ICD-10-CM | POA: Diagnosis not present

## 2020-08-29 DIAGNOSIS — X32XXXA Exposure to sunlight, initial encounter: Secondary | ICD-10-CM | POA: Diagnosis not present

## 2020-08-29 DIAGNOSIS — D045 Carcinoma in situ of skin of trunk: Secondary | ICD-10-CM | POA: Diagnosis not present

## 2020-08-29 DIAGNOSIS — Z85828 Personal history of other malignant neoplasm of skin: Secondary | ICD-10-CM | POA: Diagnosis not present

## 2020-08-29 DIAGNOSIS — D2262 Melanocytic nevi of left upper limb, including shoulder: Secondary | ICD-10-CM | POA: Diagnosis not present

## 2020-08-29 DIAGNOSIS — L570 Actinic keratosis: Secondary | ICD-10-CM | POA: Diagnosis not present

## 2020-09-01 DIAGNOSIS — Z23 Encounter for immunization: Secondary | ICD-10-CM | POA: Diagnosis not present

## 2020-09-01 DIAGNOSIS — I5022 Chronic systolic (congestive) heart failure: Secondary | ICD-10-CM | POA: Diagnosis not present

## 2020-09-01 DIAGNOSIS — I10 Essential (primary) hypertension: Secondary | ICD-10-CM | POA: Diagnosis not present

## 2020-09-01 DIAGNOSIS — Z95810 Presence of automatic (implantable) cardiac defibrillator: Secondary | ICD-10-CM | POA: Diagnosis not present

## 2020-09-01 DIAGNOSIS — I4891 Unspecified atrial fibrillation: Secondary | ICD-10-CM | POA: Diagnosis not present

## 2020-09-01 DIAGNOSIS — I428 Other cardiomyopathies: Secondary | ICD-10-CM | POA: Diagnosis not present

## 2020-09-05 DIAGNOSIS — G8929 Other chronic pain: Secondary | ICD-10-CM | POA: Diagnosis not present

## 2020-09-05 DIAGNOSIS — M25512 Pain in left shoulder: Secondary | ICD-10-CM | POA: Diagnosis not present

## 2020-10-03 DIAGNOSIS — I10 Essential (primary) hypertension: Secondary | ICD-10-CM | POA: Diagnosis not present

## 2020-10-03 DIAGNOSIS — Z8739 Personal history of other diseases of the musculoskeletal system and connective tissue: Secondary | ICD-10-CM | POA: Diagnosis not present

## 2020-10-03 DIAGNOSIS — I4891 Unspecified atrial fibrillation: Secondary | ICD-10-CM | POA: Diagnosis not present

## 2020-10-03 DIAGNOSIS — F411 Generalized anxiety disorder: Secondary | ICD-10-CM | POA: Diagnosis not present

## 2020-10-03 DIAGNOSIS — D696 Thrombocytopenia, unspecified: Secondary | ICD-10-CM | POA: Diagnosis not present

## 2020-10-03 DIAGNOSIS — I482 Chronic atrial fibrillation, unspecified: Secondary | ICD-10-CM | POA: Diagnosis not present

## 2020-10-03 DIAGNOSIS — Z23 Encounter for immunization: Secondary | ICD-10-CM | POA: Diagnosis not present

## 2020-10-04 DIAGNOSIS — C44529 Squamous cell carcinoma of skin of other part of trunk: Secondary | ICD-10-CM | POA: Diagnosis not present

## 2020-10-30 DIAGNOSIS — M533 Sacrococcygeal disorders, not elsewhere classified: Secondary | ICD-10-CM | POA: Diagnosis not present

## 2020-10-30 DIAGNOSIS — G8929 Other chronic pain: Secondary | ICD-10-CM | POA: Diagnosis not present

## 2020-10-30 DIAGNOSIS — M25512 Pain in left shoulder: Secondary | ICD-10-CM | POA: Diagnosis not present

## 2020-10-31 DIAGNOSIS — I4891 Unspecified atrial fibrillation: Secondary | ICD-10-CM | POA: Diagnosis not present

## 2021-02-02 ENCOUNTER — Other Ambulatory Visit
Admission: RE | Admit: 2021-02-02 | Discharge: 2021-02-02 | Disposition: A | Discharge status: Home / Self Care | Payer: HMO | Source: Ambulatory Visit | Attending: Cardiology | Admitting: Cardiology

## 2021-02-02 DIAGNOSIS — I482 Chronic atrial fibrillation, unspecified: Secondary | ICD-10-CM | POA: Diagnosis present

## 2021-02-02 DIAGNOSIS — I5022 Chronic systolic (congestive) heart failure: Secondary | ICD-10-CM | POA: Diagnosis present

## 2021-02-02 LAB — BRAIN NATRIURETIC PEPTIDE: B Natriuretic Peptide: >4500 pg/mL — ABNORMAL HIGH (ref 0.0–100.0)

## 2021-03-05 ENCOUNTER — Encounter: Payer: Self-pay | Admitting: *Deleted

## 2021-03-05 ENCOUNTER — Emergency Department: Payer: HMO

## 2021-03-05 ENCOUNTER — Other Ambulatory Visit: Payer: Self-pay

## 2021-03-05 ENCOUNTER — Inpatient Hospital Stay
Admission: EM | Admit: 2021-03-05 | Discharge: 2021-03-07 | DRG: 683 | Disposition: A | Discharge status: Home / Self Care | Payer: HMO | Attending: Internal Medicine | Admitting: Internal Medicine

## 2021-03-05 DIAGNOSIS — I5043 Acute on chronic combined systolic (congestive) and diastolic (congestive) heart failure: Secondary | ICD-10-CM | POA: Diagnosis present

## 2021-03-05 DIAGNOSIS — N320 Bladder-neck obstruction: Secondary | ICD-10-CM | POA: Diagnosis present

## 2021-03-05 DIAGNOSIS — N179 Acute kidney failure, unspecified: Principal | ICD-10-CM | POA: Diagnosis present

## 2021-03-05 DIAGNOSIS — Z66 Do not resuscitate: Secondary | ICD-10-CM | POA: Diagnosis present

## 2021-03-05 DIAGNOSIS — I272 Pulmonary hypertension, unspecified: Secondary | ICD-10-CM | POA: Diagnosis present

## 2021-03-05 DIAGNOSIS — Z7901 Long term (current) use of anticoagulants: Secondary | ICD-10-CM

## 2021-03-05 DIAGNOSIS — F40240 Claustrophobia: Secondary | ICD-10-CM | POA: Diagnosis present

## 2021-03-05 DIAGNOSIS — Z20822 Contact with and (suspected) exposure to covid-19: Secondary | ICD-10-CM | POA: Diagnosis present

## 2021-03-05 DIAGNOSIS — I480 Paroxysmal atrial fibrillation: Secondary | ICD-10-CM | POA: Diagnosis present

## 2021-03-05 DIAGNOSIS — Z87891 Personal history of nicotine dependence: Secondary | ICD-10-CM

## 2021-03-05 DIAGNOSIS — I4891 Unspecified atrial fibrillation: Secondary | ICD-10-CM | POA: Diagnosis present

## 2021-03-05 DIAGNOSIS — R630 Anorexia: Secondary | ICD-10-CM | POA: Diagnosis present

## 2021-03-05 DIAGNOSIS — I5022 Chronic systolic (congestive) heart failure: Secondary | ICD-10-CM | POA: Diagnosis present

## 2021-03-05 DIAGNOSIS — Z95810 Presence of automatic (implantable) cardiac defibrillator: Secondary | ICD-10-CM

## 2021-03-05 DIAGNOSIS — I5042 Chronic combined systolic (congestive) and diastolic (congestive) heart failure: Secondary | ICD-10-CM | POA: Diagnosis present

## 2021-03-05 DIAGNOSIS — I10 Essential (primary) hypertension: Secondary | ICD-10-CM | POA: Diagnosis present

## 2021-03-05 DIAGNOSIS — I130 Hypertensive heart and chronic kidney disease with heart failure and stage 1 through stage 4 chronic kidney disease, or unspecified chronic kidney disease: Secondary | ICD-10-CM | POA: Diagnosis present

## 2021-03-05 DIAGNOSIS — N189 Chronic kidney disease, unspecified: Secondary | ICD-10-CM | POA: Diagnosis present

## 2021-03-05 DIAGNOSIS — N323 Diverticulum of bladder: Secondary | ICD-10-CM | POA: Diagnosis present

## 2021-03-05 DIAGNOSIS — Z79899 Other long term (current) drug therapy: Secondary | ICD-10-CM

## 2021-03-05 DIAGNOSIS — R339 Retention of urine, unspecified: Secondary | ICD-10-CM

## 2021-03-05 DIAGNOSIS — R338 Other retention of urine: Secondary | ICD-10-CM

## 2021-03-05 DIAGNOSIS — Z86718 Personal history of other venous thrombosis and embolism: Secondary | ICD-10-CM

## 2021-03-05 DIAGNOSIS — I340 Nonrheumatic mitral (valve) insufficiency: Secondary | ICD-10-CM | POA: Diagnosis present

## 2021-03-05 DIAGNOSIS — Z885 Allergy status to narcotic agent status: Secondary | ICD-10-CM

## 2021-03-05 DIAGNOSIS — N138 Other obstructive and reflux uropathy: Secondary | ICD-10-CM | POA: Diagnosis present

## 2021-03-05 DIAGNOSIS — I428 Other cardiomyopathies: Secondary | ICD-10-CM

## 2021-03-05 DIAGNOSIS — I420 Dilated cardiomyopathy: Secondary | ICD-10-CM | POA: Diagnosis present

## 2021-03-05 DIAGNOSIS — R7989 Other specified abnormal findings of blood chemistry: Secondary | ICD-10-CM

## 2021-03-05 DIAGNOSIS — Z6827 Body mass index (BMI) 27.0-27.9, adult: Secondary | ICD-10-CM

## 2021-03-05 DIAGNOSIS — N401 Enlarged prostate with lower urinary tract symptoms: Secondary | ICD-10-CM | POA: Diagnosis present

## 2021-03-05 HISTORY — DX: Other retention of urine: R33.8

## 2021-03-05 LAB — CBC WITH DIFFERENTIAL/PLATELET
Abs Immature Granulocytes: 0.01 10*3/uL (ref 0.00–0.07)
Basophils Absolute: 0 10*3/uL (ref 0.0–0.1)
Basophils Relative: 1 %
Eosinophils Absolute: 0 10*3/uL (ref 0.0–0.5)
Eosinophils Relative: 0 %
HCT: 47.7 % (ref 39.0–52.0)
Hemoglobin: 15.2 g/dL (ref 13.0–17.0)
Immature Granulocytes: 0 %
Lymphocytes Relative: 21 %
Lymphs Abs: 1.3 10*3/uL (ref 0.7–4.0)
MCH: 29 pg (ref 26.0–34.0)
MCHC: 31.9 g/dL (ref 30.0–36.0)
MCV: 91 fL (ref 80.0–100.0)
Monocytes Absolute: 0.9 10*3/uL (ref 0.1–1.0)
Monocytes Relative: 15 %
Neutro Abs: 3.7 10*3/uL (ref 1.7–7.7)
Neutrophils Relative %: 63 %
Platelets: 152 10*3/uL (ref 150–400)
RBC: 5.24 MIL/uL (ref 4.22–5.81)
RDW: 16.6 % — ABNORMAL HIGH (ref 11.5–15.5)
WBC: 5.9 10*3/uL (ref 4.0–10.5)
nRBC: 0 % (ref 0.0–0.2)

## 2021-03-05 LAB — URINALYSIS, COMPLETE (UACMP) WITH MICROSCOPIC
Bacteria, UA: NONE SEEN
Bilirubin Urine: NEGATIVE
Glucose, UA: NEGATIVE mg/dL
Ketones, ur: NEGATIVE mg/dL
Leukocytes,Ua: NEGATIVE
Nitrite: NEGATIVE
Protein, ur: 30 mg/dL — AB
Specific Gravity, Urine: 1.014 (ref 1.005–1.030)
pH: 5 (ref 5.0–8.0)

## 2021-03-05 LAB — BASIC METABOLIC PANEL
Anion gap: 11 (ref 5–15)
BUN: 45 mg/dL — ABNORMAL HIGH (ref 8–23)
CO2: 27 mmol/L (ref 22–32)
Calcium: 9.9 mg/dL (ref 8.9–10.3)
Chloride: 101 mmol/L (ref 98–111)
Creatinine, Ser: 2.56 mg/dL — ABNORMAL HIGH (ref 0.61–1.24)
GFR, Estimated: 25 mL/min — ABNORMAL LOW (ref >60)
Glucose, Bld: 108 mg/dL — ABNORMAL HIGH (ref 70–99)
Potassium: 4.4 mmol/L (ref 3.5–5.1)
Sodium: 139 mmol/L (ref 135–145)

## 2021-03-05 LAB — RESP PANEL BY RT-PCR (FLU A&B, COVID) ARPGX2
Influenza A by PCR: NEGATIVE
Influenza B by PCR: NEGATIVE
SARS Coronavirus 2 by RT PCR: NEGATIVE

## 2021-03-05 LAB — BRAIN NATRIURETIC PEPTIDE: B Natriuretic Peptide: >4500 pg/mL — ABNORMAL HIGH (ref 0.0–100.0)

## 2021-03-05 LAB — PROTIME-INR
INR: 3 — ABNORMAL HIGH (ref 0.8–1.2)
Prothrombin Time: 31.3 seconds — ABNORMAL HIGH (ref 11.4–15.2)

## 2021-03-05 MED ORDER — ENOXAPARIN SODIUM 30 MG/0.3ML IJ SOSY
30.0000 mg | PREFILLED_SYRINGE | INTRAMUSCULAR | Status: DC
  Filled 2021-03-05: qty 0.3

## 2021-03-05 MED ORDER — ONDANSETRON HCL 4 MG PO TABS: 4.0000 mg | ORAL_TABLET | Freq: Four times a day (QID) | ORAL | Status: DC | PRN

## 2021-03-05 MED ORDER — WARFARIN SODIUM 2 MG PO TABS
2.0000 mg | ORAL_TABLET | Freq: Once | ORAL | Status: AC
  Administered 2021-03-06: 2 mg via ORAL
  Filled 2021-03-05 (×2): qty 1

## 2021-03-05 MED ORDER — ONDANSETRON HCL 4 MG/2ML IJ SOLN: 4.0000 mg | Freq: Four times a day (QID) | INTRAMUSCULAR | Status: DC | PRN

## 2021-03-05 MED ORDER — ACETAMINOPHEN 650 MG RE SUPP: 650.0000 mg | Freq: Four times a day (QID) | RECTAL | Status: DC | PRN

## 2021-03-05 MED ORDER — ACETAMINOPHEN 325 MG PO TABS
650.0000 mg | ORAL_TABLET | Freq: Four times a day (QID) | ORAL | Status: DC | PRN
  Filled 2021-03-05: qty 2

## 2021-03-05 MED ORDER — WARFARIN - PHARMACIST DOSING INPATIENT: Freq: Every day | Status: DC

## 2021-03-05 MED ORDER — TAMSULOSIN HCL 0.4 MG PO CAPS
0.4000 mg | ORAL_CAPSULE | Freq: Every day | ORAL | Status: DC
  Administered 2021-03-05 – 2021-03-07 (×3): 0.4 mg via ORAL
  Filled 2021-03-05 (×3): qty 1

## 2021-03-05 NOTE — ED Triage Notes (Signed)
Per patient's report, patient hadn't urinated since yesterday. Patient was able to provide a urine specimen here. Patient states he recently had a poor appetite and a flu-like virus.

## 2021-03-05 NOTE — H&P (Signed)
History and Physical    Adrian Chavez [id number] DOB: Oct 31, 1941 DOA: 03/05/2021  PCP: Dion Body, MD   Patient coming from: Home  I have personally briefly reviewed patient's old medical records in [place]  Chief Complaint:, Generalized weakness and not passing urine  HPI: Adrian Chavez is a 80 y.o. male with medical history significant for A. 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 Saralyn Pilar last seen 4/19 who presents to the emergency room with difficulty urinating, with his last urine output being the day prior.  It is associated with generalized malaise, poor appetite.  He denies fever or chills or myalgias.  Denies cough or shortness of breath or chest pain or palpitations.  Denies lightheadedness ED course: On arrival afebrile, BP 112/74, pulse 76 with O2 sat 96% on room air.  Blood work significant for creatinine of 2.56, above baseline of 1.0 to a month prior.  CBC WNL.  BNP greater than 4500 which was his baseline when last seen by his cardiologist on 4/19.  Troponin not done.  Urinalysis unremarkable. EKG, personally viewed and interpreted: Ventricular paced rhythm at 94 Imaging: Chest x-ray: Stable cardiomegaly without acute or active cardiopulmonary disease CT renal stone study: Bladder completely decompressed by indwelling Foley catheter.  Diffuse bladder wall thickening with scattered bladder diverticula compatible with chronic bladder outlet obstruction by the moderately enlarged prostate  Foley was placed in the emergency room with return of 2 L of urine.  Hospitalist consulted for admission for AKI.  Review of Systems: As per HPI otherwise all other systems on review of systems negative.    Past Medical History:  Diagnosis Date  . Anxiety   . CHF (congestive heart failure) ([place])   .  Claustrophobia   . Colitis, ischemic ([place]) 2010   developed DVT  . DVT (deep venous thrombosis) ([place]) 2010   stomach and leg  . Dysrhythmia    A-Fib  . Hypertension     Past Surgical History:  Procedure Laterality Date  . HERNIA REPAIR Right 2007  . IMPLANTABLE CARDIOVERTER DEFIBRILLATOR (ICD) GENERATOR CHANGE Left 02/21/2016   Procedure: ICD GENERATOR CHANGE;  Surgeon: Isaias Cowman, MD;  Location: [facility] ORS;  Service: Cardiovascular;  Laterality: Left;  . PACEMAKER INSERTION    . PITUITARY SURGERY  2002   Done at [facility]     reports that he quit smoking about 47 years ago. He has never used smokeless tobacco. He reports that he does not drink alcohol and does not use drugs.  Allergies  Allergen Reactions  . Oxycodone     vomiting    History reviewed. No pertinent family history.    Prior to Admission medications   Medication Sig Start Date End Date Taking? Authorizing Provider  ALPRAZolam Duanne Moron) 0.5 MG tablet Take 0.5 mg by mouth daily as needed for anxiety.     [provider]  carvedilol (COREG) 3.125 MG tablet Take 3.125 mg by mouth 2 (two) times daily.    [provider]  CIALIS 5 MG tablet Take 5 mg by mouth daily.     [provider]  fluticasone (FLONASE) 50 MCG/ACT nasal spray Place 2 sprays into both nostrils daily as needed for allergies or rhinitis.    [provider]  metolazone (ZAROXOLYN) 5 MG tablet Take 5 mg by mouth daily as needed (for swelling).  [provider]  potassium chloride SA (K-DUR,KLOR-CON) 20 MEQ tablet Take 20 mEq by mouth 2 (two) times daily.    [provider]  sacubitril-valsartan (ENTRESTO) 24-26 MG Take 1 tablet by mouth 2 (two) times daily.    [provider]  torsemide (DEMADEX) 20 MG tablet Take 20 mg by mouth 2 (two) times daily.    [provider]  vitamin B-12 (CYANOCOBALAMIN) 1000 MCG tablet Take 1,000 mcg by mouth daily.    [provider]   warfarin (COUMADIN) 1 MG tablet Take 3-4 mg by mouth See admin instructions. Take 3MG  by mouth every Monday, Tuesday, Wednesday, Thursday and Friday evening and 4MG  by mouth every Saturday and Sunday evening    [provider]    Physical Exam: Vitals:   03/05/21 1513 03/05/21 1519 03/05/21 1911  BP: 112/74  115/74  Pulse: 76  78  Resp: 18  17  Temp: 97.6 F (36.4 C)    TempSrc: Axillary    SpO2: 96%  98%  Weight:  78 kg   Height:  5\' 7"  (1.702 m)      Vitals:   03/05/21 1513 03/05/21 1519 03/05/21 1911  BP: 112/74  115/74  Pulse: 76  78  Resp: 18  17  Temp: 97.6 F (36.4 C)    TempSrc: Axillary    SpO2: 96%  98%  Weight:  78 kg   Height:  5\' 7"  (1.702 m)       Constitutional:  Chronically ill-appearing male alert and oriented x 3 .  Conversational dyspnea, which patient states is his baseline HEENT:      Head: Normocephalic and atraumatic.         Eyes: PERLA, EOMI, Conjunctivae are normal. Sclera is non-icteric.       Mouth/Throat: Mucous membranes are moist.       Neck: Supple with no signs of meningismus. Cardiovascular: Regular rate and rhythm. No murmurs, gallops, or rubs. 2+ symmetrical distal pulses are present .  Plus JVD.  2+ LE edema Respiratory: Respiratory effort slightly increased.Lungs sounds clear bilaterally. No wheezes, crackles, or rhonchi.  Gastrointestinal: Soft, non tender, and non distended with positive bowel sounds.  Genitourinary: No CVA tenderness. Musculoskeletal: Nontender with normal range of motion in all extremities. No cyanosis, or erythema of extremities. Neurologic:  Face is symmetric. Moving all extremities. No gross focal neurologic deficits . Skin: Skin is warm, dry.  No rash or ulcers Psychiatric: Mood and affect are normal    Labs on Admission: I have personally reviewed following labs and imaging studies  CBC: Recent Labs  Lab 03/05/21 1600  WBC 5.9  NEUTROABS 3.7  HGB 15.2  HCT 47.7  MCV 91.0  PLT 756    Basic Metabolic Panel: Recent Labs  Lab 03/05/21 1600  NA 139  K 4.4  CL 101  CO2 27  GLUCOSE 108*  BUN 45*  CREATININE 2.56*  CALCIUM 9.9   GFR: Estimated Creatinine Clearance: 22.2 mL/min (A) (by C-G formula based on SCr of 2.56 mg/dL (H)). Liver Function Tests: No results for input(s): AST, ALT, ALKPHOS, BILITOT, PROT, ALBUMIN in the last 168 hours. No results for input(s): LIPASE, AMYLASE in the last 168 hours. No results for input(s): AMMONIA in the last 168 hours. Coagulation Profile: No results for input(s): INR, PROTIME in the last 168 hours. Cardiac Enzymes: No results for input(s): CKTOTAL, CKMB, CKMBINDEX, TROPONINI in the last 168 hours. BNP (last 3 results) No results for input(s): PROBNP in the last  8760 hours. HbA1C: No results for input(s): HGBA1C in the last 72 hours. CBG: No results for input(s): GLUCAP in the last 168 hours. Lipid Profile: No results for input(s): CHOL, HDL, LDLCALC, TRIG, CHOLHDL, LDLDIRECT in the last 72 hours. Thyroid Function Tests: No results for input(s): TSH, T4TOTAL, FREET4, T3FREE, THYROIDAB in the last 72 hours. Anemia Panel: No results for input(s): VITAMINB12, FOLATE, FERRITIN, TIBC, IRON, RETICCTPCT in the last 72 hours. Urine analysis:    Component Value Date/Time   COLORURINE YELLOW (A) 03/05/2021 1349   APPEARANCEUR CLEAR (A) 03/05/2021 1349   APPEARANCEUR Clear 04/07/2012 1629   LABSPEC 1.014 03/05/2021 1349   LABSPEC 1.014 04/07/2012 1629   PHURINE 5.0 03/05/2021 1349   GLUCOSEU NEGATIVE 03/05/2021 1349   GLUCOSEU Negative 04/07/2012 1629   HGBUR SMALL (A) 03/05/2021 1349   BILIRUBINUR NEGATIVE 03/05/2021 1349   BILIRUBINUR Negative 04/07/2012 1629   KETONESUR NEGATIVE 03/05/2021 1349   PROTEINUR 30 (A) 03/05/2021 1349   NITRITE NEGATIVE 03/05/2021 1349   LEUKOCYTESUR NEGATIVE 03/05/2021 1349   LEUKOCYTESUR Negative 04/07/2012 1629    Radiological Exams on Admission: CT Renal Stone Study  Result Date:  03/05/2021 CLINICAL DATA:  Unable to urinate since yesterday. Poor appetite. Flu-like virus. EXAM: CT ABDOMEN AND PELVIS WITHOUT CONTRAST TECHNIQUE: Multidetector CT imaging of the abdomen and pelvis was performed following the standard protocol without IV contrast. COMPARISON:  None. FINDINGS: Lower chest: Several scattered tiny calcified granulomas at both lung bases. No acute abnormality at the lung bases. Marked cardiomegaly. ICD lead seen terminating in the right ventricular apex and coronary sinus. Hepatobiliary: Normal liver size. Two simple liver cysts, largest 1.5 cm in the inferior right liver. Additional scattered subcentimeter hypodense liver lesions are too small to characterize. Mild diffuse gallbladder wall thickening. No radiopaque cholelithiasis. No significant gallbladder distention. No biliary ductal dilatation. Pancreas: Normal, with no mass or duct dilation. Spleen: Normal size. No mass. Adrenals/Urinary Tract: Normal adrenals. No renal stones. No hydronephrosis. No contour deforming renal masses. Normal caliber ureters. No ureteral stones. Bladder completely decompressed by indwelling Foley catheter. Expected gas in the bladder due to instrumentation. Diffuse bladder wall thickening with scattered bladder diverticula, largest 3 cm in the posterior left bladder wall. No bladder stones. Stomach/Bowel: Normal non-distended stomach. Normal caliber small bowel with no small bowel wall thickening. Appendix not discretely visualized. No pericecal inflammatory changes. Moderate sigmoid diverticulosis with no large bowel wall thickening or significant pericolonic fat stranding. Vascular/Lymphatic: Atherosclerotic nonaneurysmal abdominal aorta. No pathologically enlarged lymph nodes in the abdomen or pelvis. Reproductive: Moderate prostatomegaly. Other: No pneumoperitoneum, ascites or focal fluid collection. Musculoskeletal: No aggressive appearing focal osseous lesions. Marked thoracolumbar spondylosis.  IMPRESSION: 1. Bladder completely decompressed by indwelling Foley catheter. 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 2. No urolithiasis. No hydronephrosis. 3. Nonspecific mild diffuse gallbladder wall thickening with no radiopaque cholelithiasis, potentially due to noninflammatory edema. 4. Marked cardiomegaly. 5. Moderate sigmoid diverticulosis. 6. Aortic Atherosclerosis (ICD10-I70.0). Electronically Signed   By: Ilona Sorrel M.D.   On: 03/05/2021 18:46     Assessment/Plan 80 year old male with history of A. 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 Saralyn Pilar last seen 4/19 who presents to the emergency room with difficulty urinating, with his last urine output being the day prior.     AKI ,  suspect mostly obstructive - 2.56, up from 1.02 a month prior -Etiology: Postrenal/obstructive plus prerenal/medication related - Hold torsemide, entresto, metolazone for tonight  - continue Foley catheter  - No IV fluids for now, given low EF - Nephrology consult for additional recommendations - Monitor kidney function - Treat each etiology as below    Acute urinary retention   Benign prostatic hyperplasia with lower urinary tract symptoms - Foley catheter placed in ER with return of 2 L -CT renal stone study showing diffuse bladder wall thickening with scattered bladder diverticula compatible with chronic bladder outlet obstruction by the moderately enlarged prostate - Flomax - Consult urology      Atrial fibrillation (HCC)   Chronic anticoagulation - Rate controlled.  Continue carvedilol - Pharmacy consult for Coumadin management    Chronic systolic CHF (congestive heart failure), NYHA class 2    NICM , EF 15-20% s/p AICD -  Not acutely decompensated - Consult cardiology to assist with medication management in view of AKI - Holding torsemide, metolazone and Entresto for tonight given AKI - Daily weights   Essential hypertension - Continue Coreg    DVT prophylaxis: Coumadin Code Status: DNR, discussed with patient Family Communication:  none  Disposition Plan: Back to previous home environment Consults called: Cardiology, urology, nephrology Status:.Observation   Athena Masse MD [facility]     03/05/2021, 7:52 PM

## 2021-03-05 NOTE — Consult Note (Signed)
ANTICOAGULATION CONSULT NOTE - Initial Consult  Pharmacy Consult for warfarin Indication: atrial fibrillation  Allergies  Allergen Reactions  . Oxycodone     vomiting    Vital Signs: Temp: 97.6 F (36.4 C) (05/02 1513) Temp Source: Axillary (05/02 1513) BP: 115/74 (05/02 1911) Pulse Rate: 78 (05/02 1911)  Labs: Recent Labs    03/05/21 1600  HGB 15.2  HCT 47.7  PLT 152  CREATININE 2.56*    Estimated Creatinine Clearance: 22.2 mL/min (A) (by C-G formula based on SCr of 2.56 mg/dL (H)).   Medical History: Past Medical History:  Diagnosis Date  . Anxiety   . CHF (congestive heart failure) ([place])   . Claustrophobia   . Colitis, ischemic ([place]) 2010   developed DVT  . DVT (deep venous thrombosis) ([place]) 2010   stomach and leg  . Dysrhythmia    A-Fib  . Hypertension     Medications:  Warfarin PTA regimen (1 mg tablets at home) -- confirmed with patient 03/05/21  3 mg : Wed, Sat, Sun  2 mg: Mon, Tues, Thurs, Friday  Total weekly dose = 17 mg  Assessment: 80 y.o. male with medical history significant for A. fib on Coumadin. Per patient has been stable long-term on warfarin regimen as above. Pharmacy has been consulted for inpatient management of warfarin  Date INR Warfarin Dose 5/1 -- 3 mg (per patient) 5/2 3.0 2 mg x 1    Goal of Therapy:  INR 2-3 Monitor platelets by anticoagulation protocol: Yes   Plan:   INR therapeutic. Contiunue home regimen and give 2 mg dose x 1 tonight  INR and CBC daily   Dorothe Pea, PharmD, BCPS Clinical Pharmacist  03/05/2021,8:05 PM

## 2021-03-05 NOTE — ED Provider Notes (Signed)
[facility] Emergency Department Provider Note  ____________________________________________   Event Date/Time   First MD Initiated Contact with Patient 03/05/21 1553     (approximate)  I have reviewed the triage vital signs and the nursing notes.   HISTORY  Chief Complaint Dysuria  HPI Adrian Chavez is a 80 y.o. male with a history of CHF, DVT,hypertension, and A. fib on Coumadin, presents to the ED for urinary retention.  Patient does give a remote history of elevated PSAs some 5 years prior, but has been lost to follow-up with his urologist.  He presents with complaints of retention and incomplete voiding since yesterday.  He was able to provide a urine sample here in the ED.  He denies any dysuria, hematuria, or flank pain.  He does report some mild decreased appetite and some flulike symptoms over the last few days.  These now resolved symptoms included some cough as well as some loose stools and abdominal pain.     Past Medical History:  Diagnosis Date  . Anxiety   . CHF (congestive heart failure) ([place])   . Claustrophobia   . Colitis, ischemic ([place]) 2010   developed DVT  . DVT (deep venous thrombosis) ([place]) 2010   stomach and leg  . Dysrhythmia    A-Fib  . Hypertension     Patient Active Problem List   Diagnosis Date Noted  . Acute urinary retention 03/05/2021  . NICM (nonischemic cardiomyopathy) ([place]) 03/05/2021  . AKI (acute kidney injury) ([place]) 03/05/2021  . Chronic anticoagulation 03/05/2021  . History of 2019 novel coronavirus disease (COVID-19) 11/22/2019  . Chronic systolic CHF (congestive heart failure), NYHA class 2 ([place]) 02/26/2018  . Essential hypertension 05/17/2014  . ICD (implantable cardioverter-defibrillator) in place 05/17/2014  . Atrial fibrillation ([place]) 03/03/2014  . Benign prostatic hyperplasia with lower urinary tract symptoms 07/10/2012    Past Surgical History:  Procedure Laterality Date  . HERNIA REPAIR Right  2007  . IMPLANTABLE CARDIOVERTER DEFIBRILLATOR (ICD) GENERATOR CHANGE Left 02/21/2016   Procedure: ICD GENERATOR CHANGE;  Surgeon: Isaias Cowman, MD;  Location: [facility] ORS;  Service: Cardiovascular;  Laterality: Left;  . PACEMAKER INSERTION    . PITUITARY SURGERY  2002   Done at [facility]    Prior to Admission medications   Medication Sig Start Date End Date Taking? Authorizing Provider  albuterol (VENTOLIN HFA) 108 (90 Base) MCG/ACT inhaler SMARTSIG:1-2 Inhalation Via Inhaler Every 4 Hours PRN 12/25/20  Yes [provider]  ALPRAZolam (XANAX) 0.5 MG tablet Take 0.5 mg by mouth daily as needed for anxiety.    Yes [provider]  azelastine (ASTELIN) 0.1 % nasal spray Place into the nose. 03/04/20  Yes [provider]  carvedilol (COREG) 3.125 MG tablet Take 3.125 mg by mouth 2 (two) times daily.   Yes [provider]  cetirizine (ZYRTEC) 10 MG tablet Take 1 tablet by mouth daily. 06/21/20  Yes [provider]  CIALIS 5 MG tablet Take 5 mg by mouth daily.    Yes [provider]  fluticasone (FLONASE) 50 MCG/ACT nasal spray Place 2 sprays into both nostrils daily as needed for allergies or rhinitis.   Yes [provider]  metolazone (ZAROXOLYN) 5 MG tablet Take 5 mg by mouth daily as needed (for swelling).   Yes [provider]  potassium chloride SA (K-DUR,KLOR-CON) 20 MEQ tablet Take 20 mEq by mouth 2 (two) times daily.   Yes [provider]  sacubitril-valsartan (ENTRESTO) 24-26 MG Take 1  tablet by mouth 2 (two) times daily.   Yes [provider]  torsemide (DEMADEX) 20 MG tablet Take 20 mg by mouth 2 (two) times daily.   Yes [provider]  vitamin B-12 (CYANOCOBALAMIN) 1000 MCG tablet Take 1,000 mcg by mouth daily.   Yes [provider]  warfarin (COUMADIN) 1 MG tablet Take 3-4 mg by mouth See admin instructions. Take 2 MG by mouth every Monday, Tuesday, 3 mg Wednesday, 2 mg Thursday and  Friday evening and 3 MG by mouth every Saturday and Sunday evening   Yes [provider]    Allergies Oxycodone  No family history on file.  Social History Social History   Tobacco Use  . Smoking status: Former Smoker    Quit date: 02/13/1974    Years since quitting: 47.0  . Smokeless tobacco: Never Used  Substance Use Topics  . Alcohol use: No  . Drug use: No    Review of Systems  Constitutional: No fever/chills Eyes: No visual changes. ENT: No sore throat. Cardiovascular: Denies chest pain. Respiratory: Denies shortness of breath. Gastrointestinal: No abdominal pain.  No nausea, no vomiting.  No diarrhea.  No constipation. Genitourinary: Negative for dysuria.  Urinary retention as above.  Musculoskeletal: Negative for back pain. Skin: Negative for rash. Neurological: Negative for headaches, focal weakness or numbness. ____________________________________________   PHYSICAL EXAM:  VITAL SIGNS: ED Triage Vitals  Enc Vitals Group     BP 03/05/21 1513 112/74     Pulse Rate 03/05/21 1513 76     Resp 03/05/21 1513 18     Temp 03/05/21 1513 97.6 F (36.4 C)     Temp Source 03/05/21 1513 Axillary     SpO2 03/05/21 1513 96 %     Weight 03/05/21 1519 172 lb (78 kg)     Height 03/05/21 1519 5\' 7"  (1.702 m)     Head Circumference --      Peak Flow --      Pain Score 03/05/21 1519 0     Pain Loc --      Pain Edu? --      Excl. in [place]? --     Constitutional: Alert and oriented. Well appearing and in no acute distress. Eyes: Conjunctivae are normal. PERRL. EOMI. Head: Atraumatic. Nose: No congestion/rhinnorhea. Mouth/Throat: Mucous membranes are moist.  Oropharynx non-erythematous. Neck: No stridor.   Cardiovascular: Normal rate, regular rhythm. Grossly normal heart sounds.  Good peripheral circulation. Respiratory: Normal respiratory effort.  No retractions. Lungs CTAB. Gastrointestinal: Soft and nontender. No distention. No abdominal bruits. No CVA  tenderness. Genitourinary: deferred Musculoskeletal: No lower extremity tenderness nor edema.  No joint effusions. Neurologic:  Normal speech and language. No gross focal neurologic deficits are appreciated. No gait instability. Skin:  Skin is warm, dry and intact. No rash noted. Psychiatric: Mood and affect are normal. Speech and behavior are normal.  ____________________________________________   LABS (all labs ordered are listed, but only abnormal results are displayed)  Labs Reviewed  URINALYSIS, COMPLETE (UACMP) WITH MICROSCOPIC - Abnormal; Notable for the following components:      Result Value   Color, Urine YELLOW (*)    APPearance CLEAR (*)    Hgb urine dipstick SMALL (*)    Protein, ur 30 (*)    All other components within normal limits  BASIC METABOLIC PANEL - Abnormal; Notable for the following components:   Glucose, Bld 108 (*)    BUN 45 (*)    Creatinine, Ser 2.56 (*)  GFR, Estimated 25 (*)    All other components within normal limits  CBC WITH DIFFERENTIAL/PLATELET - Abnormal; Notable for the following components:   RDW 16.6 (*)    All other components within normal limits  BRAIN NATRIURETIC PEPTIDE - Abnormal; Notable for the following components:   B Natriuretic Peptide >4,500.0 (*)    All other components within normal limits  RESP PANEL BY RT-PCR (FLU A&B, COVID) ARPGX2  PSA  BASIC METABOLIC PANEL  CBC  PROTIME-INR   ____________________________________________  EKG  Vent. rate 84 BPM PR interval * ms QRS duration 194 ms QT/QTcB 482/569 ms P-R-T axes * -85 96 No STEMI ____________________________________________  RADIOLOGY I, Melvenia Needles, personally viewed and evaluated these images (plain radiographs) as part of my medical decision making, as well as reviewing the written report by the radiologist.  ED MD interpretation:  Agree with report  Official radiology report(s): DG Chest Portable 1 View  Result Date: 03/05/2021 CLINICAL  DATA:  Flu like symptoms. EXAM: PORTABLE CHEST 1 VIEW COMPARISON:  August 10, 2018 FINDINGS: There is a multi lead AICD. There is no evidence of focal consolidation, pleural effusion or pneumothorax. The cardiac silhouette is markedly enlarged and unchanged in size. Degenerative changes seen involving the bilateral shoulders and thoracic spine. IMPRESSION: Stable cardiomegaly without acute or active cardiopulmonary disease. Electronically Signed   By: Virgina Norfolk M.D.   On: 03/05/2021 19:51   CT Renal Stone Study  Result Date: 03/05/2021 CLINICAL DATA:  Unable to urinate since yesterday. Poor appetite. Flu-like virus. EXAM: CT ABDOMEN AND PELVIS WITHOUT CONTRAST TECHNIQUE: Multidetector CT imaging of the abdomen and pelvis was performed following the standard protocol without IV contrast. COMPARISON:  None. FINDINGS: Lower chest: Several scattered tiny calcified granulomas at both lung bases. No acute abnormality at the lung bases. Marked cardiomegaly. ICD lead seen terminating in the right ventricular apex and coronary sinus. Hepatobiliary: Normal liver size. Two simple liver cysts, largest 1.5 cm in the inferior right liver. Additional scattered subcentimeter hypodense liver lesions are too small to characterize. Mild diffuse gallbladder wall thickening. No radiopaque cholelithiasis. No significant gallbladder distention. No biliary ductal dilatation. Pancreas: Normal, with no mass or duct dilation. Spleen: Normal size. No mass. Adrenals/Urinary Tract: Normal adrenals. No renal stones. No hydronephrosis. No contour deforming renal masses. Normal caliber ureters. No ureteral stones. Bladder completely decompressed by indwelling Foley catheter. Expected gas in the bladder due to instrumentation. Diffuse bladder wall thickening with scattered bladder diverticula, largest 3 cm in the posterior left bladder wall. No bladder stones. Stomach/Bowel: Normal non-distended stomach. Normal caliber small bowel with no  small bowel wall thickening. Appendix not discretely visualized. No pericecal inflammatory changes. Moderate sigmoid diverticulosis with no large bowel wall thickening or significant pericolonic fat stranding. Vascular/Lymphatic: Atherosclerotic nonaneurysmal abdominal aorta. No pathologically enlarged lymph nodes in the abdomen or pelvis. Reproductive: Moderate prostatomegaly. Other: No pneumoperitoneum, ascites or focal fluid collection. Musculoskeletal: No aggressive appearing focal osseous lesions. Marked thoracolumbar spondylosis. IMPRESSION: 1. Bladder completely decompressed by indwelling Foley catheter. 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 2. No urolithiasis. No hydronephrosis. 3. Nonspecific mild diffuse gallbladder wall thickening with no radiopaque cholelithiasis, potentially due to noninflammatory edema. 4. Marked cardiomegaly. 5. Moderate sigmoid diverticulosis. 6. Aortic Atherosclerosis (ICD10-I70.0). Electronically Signed   By: Ilona Sorrel M.D.   On: 03/05/2021 18:46    ____________________________________________   PROCEDURES  Procedure(s) performed (including Critical Care):  Procedures  ____________________________________________   INITIAL IMPRESSION / ASSESSMENT AND PLAN / ED COURSE  As part of my medical decision making, I reviewed the following data within the [place] reviewed stable/elevated BNP, acute BUN/Cr elevation 45/2.56. Old chart reviewed, Discussed with admitting physician H. Damita Dunnings, MD and Notes from prior ED visits     DDX: BPH, urinary retention, AKI, UTI, renal calculi, hydronephrosis  Geriatric patient ED evaluation of urinary retention and incomplete voiding.  Patient was evaluated for his complaints in the ED.  He was found to have a bladder with 450+ cc of urine.  Foley cath was placed, and dark urine was found in  the collection bag.  Patient CT scan was negative for any acute hydronephrosis or renal calculi.  CT did show some bladder wall thickening likely due to chronic prostatomegaly.  Patient was discussed with hospitalist over his acute kidney injury.  Patient was agreeable to admission for observation and further management.  He would be managed by hospitalist and evaluated by nephrology/urology for further management. ____________________________________________   FINAL CLINICAL IMPRESSION(S) / ED DIAGNOSES  Final diagnoses:  AKI (acute kidney injury) [facility])  Urinary retention     ED Discharge Orders    None      *Please note:  Adrian Chavez was evaluated in Emergency Department on 03/05/2021 for the symptoms described in the history of present illness. He was evaluated in the context of the global COVID-19 pandemic, which necessitated consideration that the patient might be at risk for infection with the SARS-CoV-2 virus that causes COVID-19. Institutional protocols and algorithms that pertain to the evaluation of patients at risk for COVID-19 are in a state of rapid change based on information released by regulatory bodies including the CDC and federal and state organizations. These policies and algorithms were followed during the patient's care in the ED.  Some ED evaluations and interventions may be delayed as a result of limited staffing during and the pandemic.*   Note:  This document was prepared using Dragon voice recognition software and may include unintentional dictation errors.    Melvenia Needles, PA-C 03/05/21 2012    Lucrezia Starch, MD 03/05/21 2204

## 2021-03-06 ENCOUNTER — Inpatient Hospital Stay: Payer: HMO

## 2021-03-06 DIAGNOSIS — Z885 Allergy status to narcotic agent status: Secondary | ICD-10-CM | POA: Diagnosis not present

## 2021-03-06 DIAGNOSIS — Z95810 Presence of automatic (implantable) cardiac defibrillator: Secondary | ICD-10-CM

## 2021-03-06 DIAGNOSIS — R630 Anorexia: Secondary | ICD-10-CM | POA: Diagnosis present

## 2021-03-06 DIAGNOSIS — Z86718 Personal history of other venous thrombosis and embolism: Secondary | ICD-10-CM | POA: Diagnosis not present

## 2021-03-06 DIAGNOSIS — N138 Other obstructive and reflux uropathy: Secondary | ICD-10-CM | POA: Diagnosis present

## 2021-03-06 DIAGNOSIS — N401 Enlarged prostate with lower urinary tract symptoms: Secondary | ICD-10-CM

## 2021-03-06 DIAGNOSIS — R339 Retention of urine, unspecified: Secondary | ICD-10-CM

## 2021-03-06 DIAGNOSIS — Z87891 Personal history of nicotine dependence: Secondary | ICD-10-CM | POA: Diagnosis not present

## 2021-03-06 DIAGNOSIS — R338 Other retention of urine: Secondary | ICD-10-CM | POA: Diagnosis present

## 2021-03-06 DIAGNOSIS — Z79899 Other long term (current) drug therapy: Secondary | ICD-10-CM | POA: Diagnosis not present

## 2021-03-06 DIAGNOSIS — N323 Diverticulum of bladder: Secondary | ICD-10-CM | POA: Diagnosis present

## 2021-03-06 DIAGNOSIS — I340 Nonrheumatic mitral (valve) insufficiency: Secondary | ICD-10-CM | POA: Diagnosis present

## 2021-03-06 DIAGNOSIS — Z20822 Contact with and (suspected) exposure to covid-19: Secondary | ICD-10-CM | POA: Diagnosis present

## 2021-03-06 DIAGNOSIS — I428 Other cardiomyopathies: Secondary | ICD-10-CM

## 2021-03-06 DIAGNOSIS — I5022 Chronic systolic (congestive) heart failure: Secondary | ICD-10-CM | POA: Diagnosis present

## 2021-03-06 DIAGNOSIS — I480 Paroxysmal atrial fibrillation: Secondary | ICD-10-CM

## 2021-03-06 DIAGNOSIS — N320 Bladder-neck obstruction: Secondary | ICD-10-CM | POA: Diagnosis present

## 2021-03-06 DIAGNOSIS — N179 Acute kidney failure, unspecified: Secondary | ICD-10-CM | POA: Diagnosis present

## 2021-03-06 DIAGNOSIS — Z6827 Body mass index (BMI) 27.0-27.9, adult: Secondary | ICD-10-CM | POA: Diagnosis not present

## 2021-03-06 DIAGNOSIS — F40240 Claustrophobia: Secondary | ICD-10-CM | POA: Diagnosis present

## 2021-03-06 DIAGNOSIS — Z66 Do not resuscitate: Secondary | ICD-10-CM | POA: Diagnosis present

## 2021-03-06 DIAGNOSIS — I130 Hypertensive heart and chronic kidney disease with heart failure and stage 1 through stage 4 chronic kidney disease, or unspecified chronic kidney disease: Secondary | ICD-10-CM | POA: Diagnosis present

## 2021-03-06 DIAGNOSIS — Z7901 Long term (current) use of anticoagulants: Secondary | ICD-10-CM | POA: Diagnosis not present

## 2021-03-06 DIAGNOSIS — I272 Pulmonary hypertension, unspecified: Secondary | ICD-10-CM | POA: Diagnosis present

## 2021-03-06 DIAGNOSIS — I10 Essential (primary) hypertension: Secondary | ICD-10-CM

## 2021-03-06 DIAGNOSIS — I420 Dilated cardiomyopathy: Secondary | ICD-10-CM | POA: Diagnosis present

## 2021-03-06 LAB — CBC
HCT: 45.6 % (ref 39.0–52.0)
Hemoglobin: 15.2 g/dL (ref 13.0–17.0)
MCH: 29.5 pg (ref 26.0–34.0)
MCHC: 33.3 g/dL (ref 30.0–36.0)
MCV: 88.5 fL (ref 80.0–100.0)
Platelets: 149 10*3/uL — ABNORMAL LOW (ref 150–400)
RBC: 5.15 MIL/uL (ref 4.22–5.81)
RDW: 16.7 % — ABNORMAL HIGH (ref 11.5–15.5)
WBC: 6.9 10*3/uL (ref 4.0–10.5)
nRBC: 0 % (ref 0.0–0.2)

## 2021-03-06 LAB — PROTIME-INR
INR: 3 — ABNORMAL HIGH (ref 0.8–1.2)
Prothrombin Time: 31.4 seconds — ABNORMAL HIGH (ref 11.4–15.2)

## 2021-03-06 LAB — BASIC METABOLIC PANEL
Anion gap: 13 (ref 5–15)
BUN: 51 mg/dL — ABNORMAL HIGH (ref 8–23)
CO2: 24 mmol/L (ref 22–32)
Calcium: 9.7 mg/dL (ref 8.9–10.3)
Chloride: 100 mmol/L (ref 98–111)
Creatinine, Ser: 2.31 mg/dL — ABNORMAL HIGH (ref 0.61–1.24)
GFR, Estimated: 28 mL/min — ABNORMAL LOW (ref >60)
Glucose, Bld: 84 mg/dL (ref 70–99)
Potassium: 4 mmol/L (ref 3.5–5.1)
Sodium: 137 mmol/L (ref 135–145)

## 2021-03-06 LAB — PSA: Prostatic Specific Antigen: 15.56 ng/mL — ABNORMAL HIGH (ref 0.00–4.00)

## 2021-03-06 MED ORDER — WARFARIN SODIUM 2 MG PO TABS
2.0000 mg | ORAL_TABLET | Freq: Once | ORAL | Status: AC
  Administered 2021-03-06: 2 mg via ORAL
  Filled 2021-03-06: qty 1

## 2021-03-06 MED ORDER — CHLORHEXIDINE GLUCONATE CLOTH 2 % EX PADS
6.0000 | MEDICATED_PAD | Freq: Every day | CUTANEOUS | Status: DC
  Administered 2021-03-06 – 2021-03-07 (×2): 6 via TOPICAL

## 2021-03-06 MED ORDER — ALPRAZOLAM 0.5 MG PO TABS
0.5000 mg | ORAL_TABLET | Freq: Every day | ORAL | Status: DC | PRN
  Administered 2021-03-07: 0.5 mg via ORAL
  Filled 2021-03-06: qty 1

## 2021-03-06 MED ORDER — CARVEDILOL 6.25 MG PO TABS
3.1250 mg | ORAL_TABLET | Freq: Two times a day (BID) | ORAL | Status: DC
  Administered 2021-03-06 – 2021-03-07 (×3): 3.125 mg via ORAL
  Filled 2021-03-06 (×4): qty 1

## 2021-03-06 MED ORDER — WARFARIN SODIUM 2 MG PO TABS
2.0000 mg | ORAL_TABLET | Freq: Once | ORAL | Status: DC
  Filled 2021-03-06: qty 1

## 2021-03-06 MED ORDER — ALBUTEROL SULFATE HFA 108 (90 BASE) MCG/ACT IN AERS
1.0000 | INHALATION_SPRAY | RESPIRATORY_TRACT | Status: DC | PRN
  Administered 2021-03-07: 1 via RESPIRATORY_TRACT
  Filled 2021-03-06 (×2): qty 6.7

## 2021-03-06 MED ORDER — SODIUM CHLORIDE 0.9 % IV SOLN: INTRAVENOUS | Status: DC

## 2021-03-06 NOTE — Consult Note (Signed)
Urology Consult  Requesting physician: Dr. Damita Dunnings  Reason for consultation: Urinary retention, AKI  Chief Complaint: Unable to urinate  History of Present Illness: Adrian Chavez is a 80 y.o. male who presented to the [facility] ED yesterday evening complaining of onset of voiding difficulty he day prior to his visit with subsequent urinary retention.   Denied dysuria or gross hematuria  Bladder scan with estimated volume of 450+ cc  16 French Foley catheter placed in the ED without difficulty with a recorded volume of 515 mL  States I saw him ~ 15 years ago after an episode of urinary retention which occurred after a procedure performed at [facility]  He also states he had a prostate biopsy ~ 15 years ago which was "inconclusive"  Saw Dr. Jacqlyn Larsen in 2014 for gross hematuria and CT urogram/cystoscopy recommended however this apparently was never scheduled or performed  No recent urologic follow-up and has been off tamsulosin for several years  Creatinine on admission 2.56 increased from a baseline of 1.09  Past Medical History:  Diagnosis Date  . Anxiety   . CHF (congestive heart failure) ([place])   . Claustrophobia   . Colitis, ischemic ([place]) 2010   developed DVT  . DVT (deep venous thrombosis) ([place]) 2010   stomach and leg  . Dysrhythmia    A-Fib  . Hypertension     Past Surgical History:  Procedure Laterality Date  . HERNIA REPAIR Right 2007  . IMPLANTABLE CARDIOVERTER DEFIBRILLATOR (ICD) GENERATOR CHANGE Left 02/21/2016   Procedure: ICD GENERATOR CHANGE;  Surgeon: Isaias Cowman, MD;  Location: [facility] ORS;  Service: Cardiovascular;  Laterality: Left;  . PACEMAKER INSERTION    . PITUITARY SURGERY  2002   Done at [facility] Medications:  Current Meds  Medication Sig  . albuterol (VENTOLIN HFA) 108 (90 Base) MCG/ACT inhaler SMARTSIG:1-2 Inhalation Via Inhaler Every 4 Hours PRN  . ALPRAZolam (XANAX) 0.5 MG tablet Take 0.5 mg by mouth daily as needed for anxiety.   Marland Kitchen  azelastine (ASTELIN) 0.1 % nasal spray Place into the nose.  . carvedilol (COREG) 3.125 MG tablet Take 3.125 mg by mouth 2 (two) times daily.  . cetirizine (ZYRTEC) 10 MG tablet Take 1 tablet by mouth daily.  Marland Kitchen CIALIS 5 MG tablet Take 5 mg by mouth daily.   . fluticasone (FLONASE) 50 MCG/ACT nasal spray Place 2 sprays into both nostrils daily as needed for allergies or rhinitis.  Marland Kitchen metolazone (ZAROXOLYN) 5 MG tablet Take 5 mg by mouth daily as needed (for swelling).  . potassium chloride SA (K-DUR,KLOR-CON) 20 MEQ tablet Take 20 mEq by mouth 2 (two) times daily.  . sacubitril-valsartan (ENTRESTO) 24-26 MG Take 1 tablet by mouth 2 (two) times daily.  Marland Kitchen torsemide (DEMADEX) 20 MG tablet Take 20 mg by mouth 2 (two) times daily.  . vitamin B-12 (CYANOCOBALAMIN) 1000 MCG tablet Take 1,000 mcg by mouth daily.  Marland Kitchen warfarin (COUMADIN) 1 MG tablet Take 3-4 mg by mouth See admin instructions. Take 2 MG by mouth every Monday, Tuesday, 3 mg Wednesday, 2 mg Thursday and Friday evening and 3 MG by mouth every Saturday and Sunday evening    Allergies:  Allergies  Allergen Reactions  . Oxycodone     vomiting    History reviewed. No pertinent family history.  Social History:  reports that he quit smoking about 47 years ago. He has never used smokeless tobacco. He reports that he does not drink alcohol and does not  use drugs.  ROS: A complete review of systems was performed.  All systems are negative except for pertinent findings as noted.  Physical Exam:  Vital signs in last 24 hours: Temp:  [97.5 F (36.4 C)-98.5 F (36.9 C)] 97.8 F (36.6 C) (05/03 1533) Pulse Rate:  [66-80] 66 (05/03 1533) Resp:  [16-20] 18 (05/03 1533) BP: (103-141)/(71-91) 103/80 (05/03 1533) SpO2:  [92 %-98 %] 97 % (05/03 1533) Weight:  [81.6 kg] 81.6 kg (05/02 2238) Constitutional:  Alert, No acute distress HEENT: [place] AT, moist mucus membranes.  Trachea midline, no masses Cardiovascular: Regular rate and rhythm, no  clubbing, cyanosis, or edema. Respiratory: Normal respiratory effort, lungs clear bilaterally GI: Abdomen is soft, nontender, nondistended, no abdominal masses GU: No CVA tenderness.  Foley catheter draining clear urine Skin: No rashes, bruises or suspicious lesions Lymph: No cervical or inguinal adenopathy Neurologic: Grossly intact, no focal deficits, moving all 4 extremities   Laboratory Data:  Recent Labs    03/05/21 1600 03/06/21 0412  WBC 5.9 6.9  HGB 15.2 15.2  HCT 47.7 45.6   Recent Labs    03/05/21 1600 03/06/21 0412  NA 139 137  K 4.4 4.0  CL 101 100  CO2 27 24  GLUCOSE 108* 84  BUN 45* 51*  CREATININE 2.56* 2.31*  CALCIUM 9.9 9.7   Recent Labs    03/05/21 2153 03/06/21 0412  INR 3.0* 3.0*   No results for input(s): LABURIN in the last 72 hours. Results for orders placed or performed during the hospital encounter of 03/05/21  Resp Panel by RT-PCR (Flu A&B, Covid) Nasopharyngeal Swab     Status: None   Collection Time: 03/05/21  4:00 PM   Specimen: Nasopharyngeal Swab; Nasopharyngeal(NP) swabs in vial transport medium  Result Value Ref Range Status   SARS Coronavirus 2 by RT PCR NEGATIVE NEGATIVE Final    Comment: (NOTE) SARS-CoV-2 target nucleic acids are NOT DETECTED.  The SARS-CoV-2 RNA is generally detectable in upper respiratory specimens during the acute phase of infection. The lowest concentration of SARS-CoV-2 viral copies this assay can detect is 138 copies/mL. A negative result does not preclude SARS-Cov-2 infection and should not be used as the sole basis for treatment or other patient management decisions. A negative result may occur with  improper specimen collection/handling, submission of specimen other than nasopharyngeal swab, presence of viral mutation(s) within the areas targeted by this assay, and inadequate number of viral copies(<138 copies/mL). A negative result must be combined with clinical observations, patient history, and  epidemiological information. The expected result is Negative.  Fact Sheet for Patients:  EntrepreneurPulse.com.au  Fact Sheet for Healthcare Providers:  IncredibleEmployment.be  This test is no t yet approved or cleared by the [place] FDA and  has been authorized for detection and/or diagnosis of SARS-CoV-2 by FDA under an Emergency Use Authorization (EUA). This EUA will remain  in effect (meaning this test can be used) for the duration of the COVID-19 declaration under Section 564(b)(1) of the Act, 21 U.S.C.section 360bbb-3(b)(1), unless the authorization is terminated  or revoked sooner.       Influenza A by PCR NEGATIVE NEGATIVE Final   Influenza B by PCR NEGATIVE NEGATIVE Final    Comment: (NOTE) The Xpert Xpress SARS-CoV-2/FLU/RSV plus assay is intended as an aid in the diagnosis of influenza from Nasopharyngeal swab specimens and should not be used as a sole basis for treatment. Nasal washings and aspirates are unacceptable for Xpert Xpress SARS-CoV-2/FLU/RSV testing.  Fact Sheet  for Patients: EntrepreneurPulse.com.au  Fact Sheet for Healthcare Providers: IncredibleEmployment.be  This test is not yet approved or cleared by the [place] FDA and has been authorized for detection and/or diagnosis of SARS-CoV-2 by FDA under an Emergency Use Authorization (EUA). This EUA will remain in effect (meaning this test can be used) for the duration of the COVID-19 declaration under Section 564(b)(1) of the Act, 21 U.S.C. section 360bbb-3(b)(1), unless the authorization is terminated or revoked.  Performed at [facility], [address] [place], [place] [address]      Radiologic Imaging: CT personally reviewed and interpreted.  The gallbladder.  Prostate volume calculated: 111cc  US RENAL  Result Date: 03/06/2021 CLINICAL DATA:  Elevated creatinine EXAM: RENAL / URINARY TRACT  ULTRASOUND COMPLETE COMPARISON:  Renal CT 03/05/2021 FINDINGS: Right Kidney: Renal measurements: 10.2 by 5.2 by 5.4 cm = volume: 152 mL. Echogenicity within normal limits. No mass or hydronephrosis visualized. Trace perirenal fluid stranding as shown on recent CT. Left Kidney: Renal measurements: 10.3 by 5.2 by 4.8 cm = volume: 135 mL. Echogenicity within normal limits. No mass or hydronephrosis visualized. Trace perirenal fluid stranding as seen on CT, nonspecific. Bladder: The thick-walled bladder is collapsed around the indwelling catheter. Other: None. IMPRESSION: 1. No hydronephrosis or hydroureter to indicate obstruction contributing to the patient's current renal insufficiency. Renal echogenicity is within normal limits. Low-level perirenal stranding/fluid, as shown on recent CT. 2. Thick-walled urinary bladder collapsed around the indwelling catheter. Electronically Signed   By: Van Clines M.D.   On: 03/06/2021 18:09   DG Chest Portable 1 View  Result Date: 03/05/2021 CLINICAL DATA:  Flu like symptoms. EXAM: PORTABLE CHEST 1 VIEW COMPARISON:  August 10, 2018 FINDINGS: There is a multi lead AICD. There is no evidence of focal consolidation, pleural effusion or pneumothorax. The cardiac silhouette is markedly enlarged and unchanged in size. Degenerative changes seen involving the bilateral shoulders and thoracic spine. IMPRESSION: Stable cardiomegaly without acute or active cardiopulmonary disease. Electronically Signed   By: Virgina Norfolk M.D.   On: 03/05/2021 19:51   CT Renal Stone Study  Result Date: 03/05/2021 CLINICAL DATA:  Unable to urinate since yesterday. Poor appetite. Flu-like virus. EXAM: CT ABDOMEN AND PELVIS WITHOUT CONTRAST TECHNIQUE: Multidetector CT imaging of the abdomen and pelvis was performed following the standard protocol without IV contrast. COMPARISON:  None. FINDINGS: Lower chest: Several scattered tiny calcified granulomas at both lung bases. No acute abnormality at  the lung bases. Marked cardiomegaly. ICD lead seen terminating in the right ventricular apex and coronary sinus. Hepatobiliary: Normal liver size. Two simple liver cysts, largest 1.5 cm in the inferior right liver. Additional scattered subcentimeter hypodense liver lesions are too small to characterize. Mild diffuse gallbladder wall thickening. No radiopaque cholelithiasis. No significant gallbladder distention. No biliary ductal dilatation. Pancreas: Normal, with no mass or duct dilation. Spleen: Normal size. No mass. Adrenals/Urinary Tract: Normal adrenals. No renal stones. No hydronephrosis. No contour deforming renal masses. Normal caliber ureters. No ureteral stones. Bladder completely decompressed by indwelling Foley catheter. Expected gas in the bladder due to instrumentation. Diffuse bladder wall thickening with scattered bladder diverticula, largest 3 cm in the posterior left bladder wall. No bladder stones. Stomach/Bowel: Normal non-distended stomach. Normal caliber small bowel with no small bowel wall thickening. Appendix not discretely visualized. No pericecal inflammatory changes. Moderate sigmoid diverticulosis with no large bowel wall thickening or significant pericolonic fat stranding. Vascular/Lymphatic: Atherosclerotic nonaneurysmal abdominal aorta. No pathologically enlarged lymph nodes in the abdomen or pelvis.  Reproductive: Moderate prostatomegaly. Other: No pneumoperitoneum, ascites or focal fluid collection. Musculoskeletal: No aggressive appearing focal osseous lesions. Marked thoracolumbar spondylosis. IMPRESSION: 1. Bladder completely decompressed by indwelling Foley catheter. 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 2. No urolithiasis. No hydronephrosis. 3. Nonspecific mild diffuse gallbladder wall thickening with no radiopaque cholelithiasis, potentially due to  noninflammatory edema. 4. Marked cardiomegaly. 5. Moderate sigmoid diverticulosis. 6. Aortic Atherosclerosis (ICD10-I70.0). Electronically Signed   By: Ilona Sorrel M.D.   On: 03/05/2021 18:46    Impression:   1.  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  History elevated PSA  Previous biopsy ~ 15 years ago "inconclusive" according to patient  3.  Acute kidney injury  Renal ultrasound was ordered by nephrology however CT on admission showed no hydronephrosis  Recommendation:   Continue tamsulosin  Indwelling Foley catheter at least 10 days  Will schedule office follow-up at discharge for catheter removal/voiding trial  Digital rectal exam on follow-up  Follow creatinine   03/06/2021, 7:57 PM  John Giovanni,  MD

## 2021-03-06 NOTE — Consult Note (Addendum)
[place] for warfarin Indication: atrial fibrillation  Allergies  Allergen Reactions  . Oxycodone     vomiting    Vital Signs: Temp: 97.6 F (36.4 C) (05/03 0636) Temp Source: Oral (05/03 0636) BP: 141/71 (05/03 0636) Pulse Rate: 66 (05/03 0636)  Labs: Recent Labs    03/05/21 1600 03/05/21 2153 03/06/21 0412  HGB 15.2  --  15.2  HCT 47.7  --  45.6  PLT 152  --  149*  LABPROT  --  31.3* 31.4*  INR  --  3.0* 3.0*  CREATININE 2.56*  --  2.31*    Estimated Creatinine Clearance: 27 mL/min (A) (by C-G formula based on SCr of 2.31 mg/dL (H)).   Medical History: Past Medical History:  Diagnosis Date  . Anxiety   . CHF (congestive heart failure) ([place])   . Claustrophobia   . Colitis, ischemic ([place]) 2010   developed DVT  . DVT (deep venous thrombosis) ([place]) 2010   stomach and leg  . Dysrhythmia    A-Fib  . Hypertension     Medications:  Warfarin PTA regimen (1 mg tablets at home) -- confirmed with patient 03/05/21  3 mg : Wed, Sat, Sun  2 mg: Mon, Tues, Thurs, Friday  Total weekly dose = 17 mg  Assessment: 80 y.o. male with medical history significant for A. fib on Coumadin. Per patient has been stable long-term on warfarin regimen as above. Pharmacy has been consulted for inpatient management of warfarin  Date INR Warfarin Dose 5/1 -- 3 mg (per patient) 5/2 3.0 2 mg  5/3 3.0 2 mg    Goal of Therapy:  INR 2-3 Monitor platelets by anticoagulation protocol: Yes   Plan:   INR therapeutic. Continue home regimen and give 2 mg dose x 1 tonight  INR and CBC daily   Lu Duffel, PharmD, BCPS Clinical Pharmacist  03/06/2021,8:19 AM

## 2021-03-06 NOTE — Consult Note (Signed)
[facility]  CONSULT NOTE    Date: 03/06/2021                  Patient Name:  Adrian Chavez  MRN: [id number]  DOB: [birth year]  Age / Sex: 80 y.o., male         PCP: Dion Body, MD                 Service Requesting Consult: [facility]                 Reason for Consult: Acute Kidney Injury            History of Present Illness: Adrian Chavez is a 80 y.o.  male with , who was admitted to [facility] on 03/05/2021 for generalized weakness and inability ot urinate. He has a past medical history of A-fib, hypertension, BPH, systolic heart failure, and AICD. He says he has had difficulty for the past day. 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  He denies currently seeing a nephrologist. He is prescribed torsemide and Metolazone outpatient He denies being diagnosed with prostate concerns. He says his breathing has been short for the past couple weeks but that wasn't unusal with his heart conditions. He denise loss of appetite and nausea.    Medications: Outpatient medications: Medications Prior to Admission  Medication Sig Dispense Refill Last Dose  . albuterol (VENTOLIN HFA) 108 (90 Base) MCG/ACT inhaler SMARTSIG:1-2 Inhalation Via Inhaler Every 4 Hours PRN   03/05/2021 at Unknown time  . ALPRAZolam (XANAX) 0.5 MG tablet Take 0.5 mg by mouth daily as needed for anxiety.    03/04/2021 at Unknown time  . azelastine (ASTELIN) 0.1 % nasal spray Place into the nose.   03/04/2021 at Unknown time  . carvedilol (COREG) 3.125 MG tablet Take 3.125 mg by mouth 2 (two) times daily.   03/05/2021 at Unknown time  . cetirizine (ZYRTEC) 10 MG tablet Take 1 tablet by mouth daily.   Past Week at Unknown time  . CIALIS 5 MG tablet Take 5 mg by mouth daily.    03/05/2021 at Unknown time  . fluticasone (FLONASE) 50 MCG/ACT nasal spray Place 2 sprays into both nostrils daily as needed for allergies or rhinitis.   03/05/2021 at Unknown time  . metolazone (ZAROXOLYN) 5 MG tablet  Take 5 mg by mouth daily as needed (for swelling).   03/04/2021 at Unknown time  . potassium chloride SA (K-DUR,KLOR-CON) 20 MEQ tablet Take 20 mEq by mouth 2 (two) times daily.   03/05/2021 at Unknown time  . sacubitril-valsartan (ENTRESTO) 24-26 MG Take 1 tablet by mouth 2 (two) times daily.   03/05/2021 at Unknown time  . torsemide (DEMADEX) 20 MG tablet Take 20 mg by mouth 2 (two) times daily.   03/05/2021 at Unknown time  . vitamin B-12 (CYANOCOBALAMIN) 1000 MCG tablet Take 1,000 mcg by mouth daily.   03/05/2021 at Unknown time  . warfarin (COUMADIN) 1 MG tablet Take 3-4 mg by mouth See admin instructions. Take 2 MG by mouth every Monday, Tuesday, 3 mg Wednesday, 2 mg Thursday and Friday evening and 3 MG by mouth every Saturday and Sunday evening   03/04/2021 at pm    Current medications: Current Facility-Administered Medications  Medication Dose Route Frequency Provider Last Rate Last Admin  . acetaminophen (TYLENOL) tablet 650 mg  650 mg Oral Q6H PRN Athena Masse, MD       Or  . acetaminophen (  TYLENOL) suppository 650 mg  650 mg Rectal Q6H PRN Athena Masse, MD      . albuterol (VENTOLIN HFA) 108 (90 Base) MCG/ACT inhaler 1 puff  1 puff Inhalation Q4H PRN Athena Masse, MD      . ALPRAZolam Duanne Moron) tablet 0.5 mg  0.5 mg Oral Daily PRN Athena Masse, MD      . carvedilol (COREG) tablet 3.125 mg  3.125 mg Oral BID Athena Masse, MD   3.125 mg at 03/06/21 [id number]  . Chlorhexidine Gluconate Cloth 2 % PADS 6 each  6 each Topical Daily Athena Masse, MD   6 each at 03/06/21 [phone]  . ondansetron (ZOFRAN) tablet 4 mg  4 mg Oral Q6H PRN Athena Masse, MD       Or  . ondansetron  [facility]) injection 4 mg  4 mg Intravenous Q6H PRN Athena Masse, MD      . tamsulosin [facility]) capsule 0.4 mg  0.4 mg Oral Daily Judd Gaudier V, MD   0.4 mg at 03/06/21 0856  . warfarin (COUMADIN) tablet 2 mg  2 mg Oral ONCE-1600 [place], Pierce Crane, [facility]      . Warfarin - Pharmacist Dosing Inpatient   Does not apply  q1600 Dorothe Pea, [facility]          Allergies: Allergies  Allergen Reactions  . Oxycodone     vomiting      Past Medical History: Past Medical History:  Diagnosis Date  . Anxiety   . CHF (congestive heart failure) ([place])   . Claustrophobia   . Colitis, ischemic ([place]) 2010   developed DVT  . DVT (deep venous thrombosis) ([place]) 2010   stomach and leg  . Dysrhythmia    A-Fib  . Hypertension      Past Surgical History: Past Surgical History:  Procedure Laterality Date  . HERNIA REPAIR Right 2007  . IMPLANTABLE CARDIOVERTER DEFIBRILLATOR (ICD) GENERATOR CHANGE Left 02/21/2016   Procedure: ICD GENERATOR CHANGE;  Surgeon: Isaias Cowman, MD;  Location: [facility] ORS;  Service: Cardiovascular;  Laterality: Left;  . PACEMAKER INSERTION    . PITUITARY SURGERY  2002   Done at [place] History: History reviewed. No pertinent family history.   Social History: Social History   Socioeconomic History  . Marital status: Married    Spouse name: Not on file  . Number of children: Not on file  . Years of education: Not on file  . Highest education level: Not on file  Occupational History  . Not on file  Tobacco Use  . Smoking status: Former Smoker    Quit date: 02/13/1974    Years since quitting: 47.0  . Smokeless tobacco: Never Used  Substance and Sexual Activity  . Alcohol use: No  . Drug use: No  . Sexual activity: Not on file  Other Topics Concern  . Not on file  Social History Narrative  . Not on file   Social Determinants of Health   Financial Resource Strain: Not on file  Food Insecurity: Not on file  Transportation Needs: Not on file  Physical Activity: Not on file  Stress: Not on file  Social Connections: Not on file  Intimate Partner Violence: Not on file     Review of Systems: ROS  Vital Signs: Blood pressure 107/74, pulse 79, temperature 97.6 F (36.4 C), temperature source Oral, resp. rate 18, height 5\' 7"  (1.702 m), weight 81.6 kg,  SpO2 98 %.  Weight  trends: Filed Weights   03/05/21 1519 03/05/21 2238  Weight: 78 kg 81.6 kg    Physical Exam: General: NAD, resting in bed  Head: Normocephalic, atraumatic. Moist oral mucosal membranes  Eyes: Anicteric  Lungs:  Clear to auscultation, normal breathing effort at rest  Heart: Regular rate and rhythm  Abdomen:  Soft, nontender,   Extremities:  trace peripheral edema.  Neurologic: Alert, oriented, moving all four extremities  Skin: No lesions  GU Foley     Lab results: Basic Metabolic Panel: Recent Labs  Lab 03/05/21 1600 03/06/21 0412  NA 139 137  K 4.4 4.0  CL 101 100  CO2 27 24  GLUCOSE 108* 84  BUN 45* 51*  CREATININE 2.56* 2.31*  CALCIUM 9.9 9.7    Liver Function Tests: No results for input(s): AST, ALT, ALKPHOS, BILITOT, PROT, ALBUMIN in the last 168 hours. No results for input(s): LIPASE, AMYLASE in the last 168 hours. No results for input(s): AMMONIA in the last 168 hours.  CBC: Recent Labs  Lab 03/05/21 1600 03/06/21 0412  WBC 5.9 6.9  NEUTROABS 3.7  --   HGB 15.2 15.2  HCT 47.7 45.6  MCV 91.0 88.5  PLT 152 149*    Cardiac Enzymes: No results for input(s): CKTOTAL, CKMB, CKMBINDEX, TROPONINI in the last 168 hours.  BNP: Invalid input(s): POCBNP  CBG: No results for input(s): GLUCAP in the last 168 hours.  Microbiology: Results for orders placed or performed during the hospital encounter of 03/05/21  Resp Panel by RT-PCR (Flu A&B, Covid) Nasopharyngeal Swab     Status: None   Collection Time: 03/05/21  4:00 PM   Specimen: Nasopharyngeal Swab; Nasopharyngeal(NP) swabs in vial transport medium  Result Value Ref Range Status   SARS Coronavirus 2 by RT PCR NEGATIVE NEGATIVE Final    Comment: (NOTE) SARS-CoV-2 target nucleic acids are NOT DETECTED.  The SARS-CoV-2 RNA is generally detectable in upper respiratory specimens during the acute phase of infection. The lowest concentration of SARS-CoV-2 viral copies this assay can  detect is 138 copies/mL. A negative result does not preclude SARS-Cov-2 infection and should not be used as the sole basis for treatment or other patient management decisions. A negative result may occur with  improper specimen collection/handling, submission of specimen other than nasopharyngeal swab, presence of viral mutation(s) within the areas targeted by this assay, and inadequate number of viral copies(<138 copies/mL). A negative result must be combined with clinical observations, patient history, and epidemiological information. The expected result is Negative.  Fact Sheet for Patients:  EntrepreneurPulse.com.au  Fact Sheet for Healthcare Providers:  IncredibleEmployment.be  This test is no t yet approved or cleared by the [place] FDA and  has been authorized for detection and/or diagnosis of SARS-CoV-2 by FDA under an Emergency Use Authorization (EUA). This EUA will remain  in effect (meaning this test can be used) for the duration of the COVID-19 declaration under Section 564(b)(1) of the Act, 21 U.S.C.section 360bbb-3(b)(1), unless the authorization is terminated  or revoked sooner.       Influenza A by PCR NEGATIVE NEGATIVE Final   Influenza B by PCR NEGATIVE NEGATIVE Final    Comment: (NOTE) The Xpert Xpress SARS-CoV-2/FLU/RSV plus assay is intended as an aid in the diagnosis of influenza from Nasopharyngeal swab specimens and should not be used as a sole basis for treatment. Nasal washings and aspirates are unacceptable for Xpert Xpress SARS-CoV-2/FLU/RSV testing.  Fact Sheet for Patients: EntrepreneurPulse.com.au  Fact Sheet for Healthcare Providers: IncredibleEmployment.be  This test is not yet approved or cleared by the [place] and has been authorized for detection and/or diagnosis of SARS-CoV-2 by FDA under an Emergency Use Authorization (EUA). This EUA will remain in  effect (meaning this test can be used) for the duration of the COVID-19 declaration under Section 564(b)(1) of the Act, 21 U.S.C. section 360bbb-3(b)(1), unless the authorization is terminated or revoked.  Performed at [facility], [address] [place], [place] [address]     Coagulation Studies: Recent Labs    03/05/21 2152/02/11 03/06/21 0412  LABPROT 31.3* 31.4*  INR 3.0* 3.0*    Urinalysis: Recent Labs    03/05/21 1349  COLORURINE YELLOW*  LABSPEC 1.014  PHURINE 5.0  GLUCOSEU NEGATIVE  HGBUR SMALL*  BILIRUBINUR NEGATIVE  KETONESUR NEGATIVE  PROTEINUR 30*  NITRITE NEGATIVE  LEUKOCYTESUR NEGATIVE      Imaging: DG Chest Portable 1 View  Result Date: 03/05/2021 CLINICAL DATA:  Flu like symptoms. EXAM: PORTABLE CHEST 1 VIEW COMPARISON:  August 10, 2018 FINDINGS: There is a multi lead AICD. There is no evidence of focal consolidation, pleural effusion or pneumothorax. The cardiac silhouette is markedly enlarged and unchanged in size. Degenerative changes seen involving the bilateral shoulders and thoracic spine. IMPRESSION: Stable cardiomegaly without acute or active cardiopulmonary disease. Electronically Signed   By: Virgina Norfolk M.D.   On: 03/05/2021 19:51   CT Renal Stone Study  Result Date: 03/05/2021 CLINICAL DATA:  Unable to urinate since yesterday. Poor appetite. Flu-like virus. EXAM: CT ABDOMEN AND PELVIS WITHOUT CONTRAST TECHNIQUE: Multidetector CT imaging of the abdomen and pelvis was performed following the standard protocol without IV contrast. COMPARISON:  None. FINDINGS: Lower chest: Several scattered tiny calcified granulomas at both lung bases. No acute abnormality at the lung bases. Marked cardiomegaly. ICD lead seen terminating in the right ventricular apex and coronary sinus. Hepatobiliary: Normal liver size. Two simple liver cysts, largest 1.5 cm in the inferior right liver. Additional scattered subcentimeter hypodense liver lesions are too  small to characterize. Mild diffuse gallbladder wall thickening. No radiopaque cholelithiasis. No significant gallbladder distention. No biliary ductal dilatation. Pancreas: Normal, with no mass or duct dilation. Spleen: Normal size. No mass. Adrenals/Urinary Tract: Normal adrenals. No renal stones. No hydronephrosis. No contour deforming renal masses. Normal caliber ureters. No ureteral stones. Bladder completely decompressed by indwelling Foley catheter. Expected gas in the bladder due to instrumentation. Diffuse bladder wall thickening with scattered bladder diverticula, largest 3 cm in the posterior left bladder wall. No bladder stones. Stomach/Bowel: Normal non-distended stomach. Normal caliber small bowel with no small bowel wall thickening. Appendix not discretely visualized. No pericecal inflammatory changes. Moderate sigmoid diverticulosis with no large bowel wall thickening or significant pericolonic fat stranding. Vascular/Lymphatic: Atherosclerotic nonaneurysmal abdominal aorta. No pathologically enlarged lymph nodes in the abdomen or pelvis. Reproductive: Moderate prostatomegaly. Other: No pneumoperitoneum, ascites or focal fluid collection. Musculoskeletal: No aggressive appearing focal osseous lesions. Marked thoracolumbar spondylosis. IMPRESSION: 1. Bladder completely decompressed by indwelling Foley catheter. 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 2. No urolithiasis. No hydronephrosis. 3. Nonspecific mild diffuse gallbladder wall thickening with no radiopaque cholelithiasis, potentially due to noninflammatory edema. 4. Marked cardiomegaly. 5. Moderate sigmoid diverticulosis. 6. Aortic Atherosclerosis (ICD10-I70.0). Electronically Signed   By: Ilona Sorrel M.D.   On: 03/05/2021 18:46      Assessment & Plan: Adrian Chavez is a 80 y.o.  male with , who was  admitted to [facility] on  03/05/2021 for generalized weakness and inability ot urinate. He has a past medical history of A-fib, hypertension, BPH, systolic heart failure, and AICD. He says he has had difficulty for the past day.    1. Acute kidney injury likely due to over diuresis Baseline Creatinine 1.02 on 08/10/18 - Current level 2.56 - home diuretics held - Renal US to rule out obstruction - gently hydration of NS 65ml/hr - Foley placed in ED for enlarged prostate, immediate return of 2L - Continue to monitor renal funtion   2. Hypertension - Soft BP at this time - Pulse 80s-90s - Currently prescribed Coreg    LOS: 0 Adrian Chavez 5/3/202212:25 PM

## 2021-03-06 NOTE — Consult Note (Signed)
CARDIOLOGY CONSULT NOTE               Patient ID: Adrian Chavez MRN: [id number] DOB/AGE: [birth year] 78 y.o.  Admit date: 03/05/2021 Referring Physician Dr. Judd Gaudier hospitalist Primary Physician Dr. Netty Starring primary Primary Cardiologist Dr. 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 renal insufficiency colitis previous DVT atrial fibrillation hypertension poorly had medications advanced heart failure including Demadex Entresto Zaroxolyn Coreg but began to develop severe reduced urine output and elevated creatinine with reduced renal function initially was thought to be related to overdiuresis on medical therapy but patient was found to have enlarged prostate with obstruction once relieved with Foley patient had significant improvement in symptoms urology was given involved to help with treatment of BPH and obstruction  Review of systems complete and found to be negative unless listed above     Past Medical History:  Diagnosis Date  . Anxiety   . CHF (congestive heart failure) ([place])   . Claustrophobia   . Colitis, ischemic ([place]) 2010   developed DVT  . DVT (deep venous thrombosis) ([place]) 2010   stomach and leg  . Dysrhythmia    A-Fib  . Hypertension     Past Surgical History:  Procedure Laterality Date  . HERNIA REPAIR Right 2007  . IMPLANTABLE CARDIOVERTER DEFIBRILLATOR (ICD) GENERATOR CHANGE Left 02/21/2016   Procedure: ICD GENERATOR CHANGE;  Surgeon: Isaias Cowman, MD;  Location: [facility] ORS;  Service: Cardiovascular;  Laterality: Left;  . PACEMAKER INSERTION    . PITUITARY SURGERY  2002   Done at [facility]    Medications Prior to Admission  Medication Sig Dispense Refill Last Dose  . albuterol (VENTOLIN HFA) 108 (90 Base) MCG/ACT inhaler SMARTSIG:1-2 Inhalation Via Inhaler Every 4 Hours PRN   03/05/2021  at Unknown time  . ALPRAZolam (XANAX) 0.5 MG tablet Take 0.5 mg by mouth daily as needed for anxiety.    03/04/2021 at Unknown time  . azelastine (ASTELIN) 0.1 % nasal spray Place into the nose.   03/04/2021 at Unknown time  . carvedilol (COREG) 3.125 MG tablet Take 3.125 mg by mouth 2 (two) times daily.   03/05/2021 at Unknown time  . cetirizine (ZYRTEC) 10 MG tablet Take 1 tablet by mouth daily.   Past Week at Unknown time  . CIALIS 5 MG tablet Take 5 mg by mouth daily.    03/05/2021 at Unknown time  . fluticasone (FLONASE) 50 MCG/ACT nasal spray Place 2 sprays into both nostrils daily as needed for allergies or rhinitis.   03/05/2021 at Unknown time  . metolazone (ZAROXOLYN) 5 MG tablet Take 5 mg by mouth daily as needed (for swelling).   03/04/2021 at Unknown time  . potassium chloride SA (K-DUR,KLOR-CON) 20 MEQ tablet Take 20 mEq by mouth 2 (two) times daily.   03/05/2021 at Unknown time  . sacubitril-valsartan (ENTRESTO) 24-26 MG Take 1 tablet by mouth 2 (two) times daily.   03/05/2021 at Unknown time  . torsemide (DEMADEX) 20 MG tablet Take 20 mg by mouth 2 (two) times daily.   03/05/2021 at Unknown time  . vitamin B-12 (CYANOCOBALAMIN) 1000 MCG tablet Take 1,000 mcg by mouth daily.   03/05/2021 at Unknown time  . warfarin (COUMADIN) 1 MG tablet Take 3-4 mg by mouth See admin instructions. Take 2 MG by mouth every Monday, Tuesday, 3 mg Wednesday, 2 mg Thursday and Friday evening and 3 MG by  mouth every Saturday and Sunday evening   03/04/2021 at pm   Social History   Socioeconomic History  . Marital status: Married    Spouse name: Not on file  . Number of children: Not on file  . Years of education: Not on file  . Highest education level: Not on file  Occupational History  . Not on file  Tobacco Use  . Smoking status: Former Smoker    Quit date: 02/13/1974    Years since quitting: 47.0  . Smokeless tobacco: Never Used  Substance and Sexual Activity  . Alcohol use: No  . Drug use: No  . Sexual  activity: Not on file  Other Topics Concern  . Not on file  Social History Narrative  . Not on file   Social Determinants of Health   Financial Resource Strain: Not on file  Food Insecurity: Not on file  Transportation Needs: Not on file  Physical Activity: Not on file  Stress: Not on file  Social Connections: Not on file  Intimate Partner Violence: Not on file    History reviewed. No pertinent family history.    Review of systems complete and found to be negative unless listed above      PHYSICAL EXAM  General: Well developed, well nourished, in no acute distress HEENT:  Normocephalic and atramatic Neck:  No JVD.  Lungs: Clear bilaterally to auscultation and percussion. Heart: Irregular. Normal S1 and S2 without gallops or murmurs.  Abdomen: Bowel sounds are positive, abdomen soft and non-tender  Msk:  Back normal, normal gait. Normal strength and tone for age. Extremities: No clubbing, cyanosis or edema.   Neuro: Alert and oriented X 3. Psych:  Good affect, responds appropriately  Labs:   Lab Results  Component Value Date   WBC 6.9 03/06/2021   HGB 15.2 03/06/2021   HCT 45.6 03/06/2021   MCV 88.5 03/06/2021   PLT 149 (L) 03/06/2021    Recent Labs  Lab 03/06/21 0412  NA 137  K 4.0  CL 100  CO2 24  BUN 51*  CREATININE 2.31*  CALCIUM 9.7  GLUCOSE 84   Lab Results  Component Value Date   CKTOTAL 142 04/07/2012   CKMB 2.5 04/07/2012   TROPONINI 0.04 (H) 09/08/2015   No results found for: CHOL No results found for: HDL No results found for: LDLCALC No results found for: TRIG No results found for: CHOLHDL No results found for: LDLDIRECT    Radiology: DG Chest Portable 1 View  Result Date: 03/05/2021 CLINICAL DATA:  Flu like symptoms. EXAM: PORTABLE CHEST 1 VIEW COMPARISON:  August 10, 2018 FINDINGS: There is a multi lead AICD. There is no evidence of focal consolidation, pleural effusion or pneumothorax. The cardiac silhouette is markedly enlarged  and unchanged in size. Degenerative changes seen involving the bilateral shoulders and thoracic spine. IMPRESSION: Stable cardiomegaly without acute or active cardiopulmonary disease. Electronically Signed   By: Virgina Norfolk M.D.   On: 03/05/2021 19:51   CT Renal Stone Study  Result Date: 03/05/2021 CLINICAL DATA:  Unable to urinate since yesterday. Poor appetite. Flu-like virus. EXAM: CT ABDOMEN AND PELVIS WITHOUT CONTRAST TECHNIQUE: Multidetector CT imaging of the abdomen and pelvis was performed following the standard protocol without IV contrast. COMPARISON:  None. FINDINGS: Lower chest: Several scattered tiny calcified granulomas at both lung bases. No acute abnormality at the lung bases. Marked cardiomegaly. ICD lead seen terminating in the right ventricular apex and coronary sinus. Hepatobiliary: Normal liver size. Two simple liver  cysts, largest 1.5 cm in the inferior right liver. Additional scattered subcentimeter hypodense liver lesions are too small to characterize. Mild diffuse gallbladder wall thickening. No radiopaque cholelithiasis. No significant gallbladder distention. No biliary ductal dilatation. Pancreas: Normal, with no mass or duct dilation. Spleen: Normal size. No mass. Adrenals/Urinary Tract: Normal adrenals. No renal stones. No hydronephrosis. No contour deforming renal masses. Normal caliber ureters. No ureteral stones. Bladder completely decompressed by indwelling Foley catheter. Expected gas in the bladder due to instrumentation. Diffuse bladder wall thickening with scattered bladder diverticula, largest 3 cm in the posterior left bladder wall. No bladder stones. Stomach/Bowel: Normal non-distended stomach. Normal caliber small bowel with no small bowel wall thickening. Appendix not discretely visualized. No pericecal inflammatory changes. Moderate sigmoid diverticulosis with no large bowel wall thickening or significant pericolonic fat stranding. Vascular/Lymphatic:  Atherosclerotic nonaneurysmal abdominal aorta. No pathologically enlarged lymph nodes in the abdomen or pelvis. Reproductive: Moderate prostatomegaly. Other: No pneumoperitoneum, ascites or focal fluid collection. Musculoskeletal: No aggressive appearing focal osseous lesions. Marked thoracolumbar spondylosis. IMPRESSION: 1. Bladder completely decompressed by indwelling Foley catheter. 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 2. No urolithiasis. No hydronephrosis. 3. Nonspecific mild diffuse gallbladder wall thickening with no radiopaque cholelithiasis, potentially due to noninflammatory edema. 4. Marked cardiomegaly. 5. Moderate sigmoid diverticulosis. 6. Aortic Atherosclerosis (ICD10-I70.0). Electronically Signed   By: Ilona Sorrel M.D.   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   Plan Acute on chronic systolic congestive heart failure poorly compensated would consider increasing diuretic therapy possibly on Lasix drip Acute on chronic renal insufficiency probably iatrogenic related to dehydration and overmedication nephrology input hold Zaroxolyn hold Demadex and Entresto temporarily Worsening renal function mostly related to obstruction agree with Foley therapy and urology Continue heart failure therapy Supplemental oxygen as necessary for shortness of breath History of DVT continue Coumadin therapy Nonischemic cardiomyopathy ejection fraction less than 20% continue current therapy Agree with Foley therapy to help with obstruction and treatment for BPH as per  urology  Signed: Yolonda Kida MD 03/06/2021, 1:21 PM

## 2021-03-06 NOTE — Plan of Care (Signed)

## 2021-03-06 NOTE — Progress Notes (Signed)
Mobility Specialist - Progress Note   03/06/21 1600  Mobility  Activity Ambulated in hall  Level of Assistance Minimal assist, patient does 75% or more  Assistive Device None (1-hand assist)  Distance Ambulated (ft) 90 ft  Mobility Response Tolerated well  Mobility performed by Mobility specialist  $Mobility charge 1 Mobility    Pre-mobility: 70 HR, 94% SpO2 Post-mobility: 84 HR, 95% SpO2   Pt ambulated in hallway with 1-hand assist. No LOB. MinA for to reach EOB/AMB. Supervision to stand. Reports not using AD PTA. Denied dizziness/fatigue. Denied SOB on RA, "I'm no more short of breath than I normally am". Pt returned supine without physical assistance. Able to reposition self to [facility] [place]. Bed alarm set.    Kathee Delton Mobility Specialist 03/06/21, 4:23 PM

## 2021-03-06 NOTE — Progress Notes (Addendum)
PROGRESS NOTE    Adrian Chavez  VVO:[id number] DOB: Apr 21, 1941 DOA: 03/05/2021 PCP: Dion Body, MD    Brief Narrative:  Adrian Chavez is a 80 year old male with past medical history significant for paroxysmal atrial fibrillation on Coumadin, essential hypertension, BPH, chronic systolic congestive heart failure, nonischemic dilated cardiomyopathy s/p AICD with last EF 15-20% 2019, severe MR, pulmonary hypertension who presented to the ED with difficulty urinating.  Patient reports last urine output the day prior.  Patient with also associated generalized malaise, poor appetite.  Patient denies cough/shortness of breath, no chest pains, no palpitations, no fever/chills or myalgias.  In the ED, temperature 97.6 F, HR 76, RR 18, BP 112/74, SPO2 96% on room air.  Sodium 139, potassium 4.4, chloride 101, CO2 27, glucose 108, BUN 45, creatinine 2.56.  BNP greater than 4500.  WBC 5.9, hemoglobin 15.2, platelets 152.  Urinalysis unrevealing.  COVID-19 PCR negative.  Influenza A/B PCR negative.  Chest x-ray with stable cardiomegaly without acute cardiopulmonary disease process.  CT renal stone study with bladder decompressed by indwelling Foley catheter with diffuse bladder wall thickening and scattered bladder diverticuli compatible with chronic bladder outlet obstruction, markedly enlarged prostate, no hydronephrosis.  Bladder scan in ED was notable for greater than 450 mL of retained urine.  Foley catheter was placed.  Nephrology/urology consult placed.  TRH consulted for further evaluation and management of acute urinary retention requiring Foley catheterization and acute renal failure.   Assessment & Plan:   Active Problems:   Acute urinary retention   Benign prostatic hyperplasia with lower urinary tract symptoms   Atrial fibrillation (HCC)   Chronic systolic CHF (congestive heart failure), NYHA class 2 (HCC)   Essential hypertension   ICD (implantable cardioverter-defibrillator) in place    NICM (nonischemic cardiomyopathy) ([place])   AKI (acute kidney injury) ([place])   Chronic anticoagulation   Acute renal failure (ARF) ([place])   Acute urinary retention Acute renal failure BPH Patient presenting to the ED with 1 day history of an inability to urinate.  Patient was noted to have greater than 450 cc of retained urine by bladder scan and subsequently had Foley catheter placed with 2 L urine output reported initially.  CT abdomen/pelvis notable for chronic bladder wall thickening with diverticuli consistent with chronic obstruction and mildly enlarged prostate.  Creatinine elevated to 2.56 with a baseline of 1.02.  Patient is afebrile without leukocytosis. --Nephrology/urology following, appreciate assistance --Cr 2.56>2.31 --Continue Foley catheter --Started on tamsulosin 0.4 mg p.o. daily --Holding home torsemide, Entresto, metolazone for now for AKI; euvolemic on physical exam --Renal ultrasound: Pending --Strict I's and O's and daily weights --BMP daily  Paroxysmal atrial fibrillation -- Carvedilol 3.125 mg p.o. twice daily -- Continue Coumadin, pharmacy consulted for dosing/monitoring -- INR 3.0 today; upper limits of normal  Chronic systolic congestive heart failure, NYHA class 2; compensated Nonischemic dilated cardiomyopathy Severe mitral regurgitation with pulmonary hypertension Follows with cardiology outpatient, Dr. Saralyn Pilar.  TTE 2019 with LVEF 15-20% with severe MR and pulmonary hypertension.  S/p AICD. --Cardiology consulted --Carvedilol 3.125 mg p.o. twice daily --Holding home metolazone, Entresto, torsemide for AKI as above --Strict I's and O's and daily weights  Anxiety: Alprazolam 0.5 mg p.o. daily as needed   DVT prophylaxis: Coumadin   Code Status: DNR Family Communication: No family present at bedside this am  Disposition Plan:  Level of care: Med-Surg Status is: Inpatient  Remains inpatient appropriate because:Ongoing diagnostic testing needed  not appropriate for outpatient work up, Unsafe d/c plan,  IV treatments appropriate due to intensity of illness or inability to take PO and Inpatient level of care appropriate due to severity of illness   Dispo: The patient is from: Home              Anticipated d/c is to: Home              Patient currently is not medically stable to d/c.   Difficult to place patient No   Consultants:   Nephrology  Urology  Cardiology  Procedures:   Foley catheter placement 5/2  Antimicrobials:   None   Subjective: Patient seen examined bedside, resting comfortably.  Fatigue and malaise improved.  Suprapubic discomfort now resolved following Foley catheter placement.  RN and nephrology NP present at bedside.  No other specific complaints at this time.  Discussed with patient regarding his worsened renal function, slightly improved today.  Denies headache, no visual changes, no chest pain, no palpitations, no shortness of breath, no abdominal pain, no weakness, no fatigue, no paresthesias.  No acute events overnight per nursing staff.  Objective: Vitals:   03/06/21 0059 03/06/21 0636 03/06/21 0852 03/06/21 1124  BP: (!) 107/91 (!) 141/71 103/81 107/74  Pulse: 69 66 80 79  Resp: 20 16 18 18   Temp: (!) 97.5 F (36.4 C) 97.6 F (36.4 C) 98.5 F (36.9 C) 97.6 F (36.4 C)  TempSrc: Oral Oral Oral Oral  SpO2: 92% 97% 92% 98%  Weight:      Height:        Intake/Output Summary (Last 24 hours) at 03/06/2021 1430 Last data filed at 03/06/2021 1300 Gross per 24 hour  Intake 120 ml  Output 1000 ml  Net -880 ml   Filed Weights   03/05/21 1519 03/05/21 2238  Weight: 78 kg 81.6 kg    Examination:  General exam: Appears calm and comfortable  Respiratory system: Clear to auscultation. Respiratory effort normal.  On room air Cardiovascular system: S1 & S2 heard, RRR. No JVD, murmurs, rubs, gallops or clicks. No pedal edema. Gastrointestinal system: Abdomen is nondistended, soft and nontender.  No organomegaly or masses felt. Normal bowel sounds heard. GU: Foley catheter noted, yellow urine in bag Central nervous system: Alert and oriented. No focal neurological deficits. Extremities: Symmetric 5 x 5 power. Skin: No rashes, lesions or ulcers Psychiatry: Judgement and insight appear normal. Mood & affect appropriate.     Data Reviewed: I have personally reviewed following labs and imaging studies  CBC: Recent Labs  Lab 03/05/21 1600 03/06/21 0412  WBC 5.9 6.9  NEUTROABS 3.7  --   HGB 15.2 15.2  HCT 47.7 45.6  MCV 91.0 88.5  PLT 152 [id number]*   Basic Metabolic Panel: Recent Labs  Lab 03/05/21 1600 03/06/21 0412  NA 139 137  K 4.4 4.0  CL 101 100  CO2 27 24  GLUCOSE 108* 84  BUN 45* 51*  CREATININE 2.56* 2.31*  CALCIUM 9.9 9.7   GFR: Estimated Creatinine Clearance: 27 mL/min (A) (by C-G formula based on SCr of 2.31 mg/dL (H)). Liver Function Tests: No results for input(s): AST, ALT, ALKPHOS, BILITOT, PROT, ALBUMIN in the last 168 hours. No results for input(s): LIPASE, AMYLASE in the last 168 hours. No results for input(s): AMMONIA in the last 168 hours. Coagulation Profile: Recent Labs  Lab 03/05/21 2153 03/06/21 0412  INR 3.0* 3.0*   Cardiac Enzymes: No results for input(s): CKTOTAL, CKMB, CKMBINDEX, TROPONINI in the last 168 hours. BNP (last 3 results) No results for  input(s): PROBNP in the last 8760 hours. HbA1C: No results for input(s): HGBA1C in the last 72 hours. CBG: No results for input(s): GLUCAP in the last 168 hours. Lipid Profile: No results for input(s): CHOL, HDL, LDLCALC, TRIG, CHOLHDL, LDLDIRECT in the last 72 hours. Thyroid Function Tests: No results for input(s): TSH, T4TOTAL, FREET4, T3FREE, THYROIDAB in the last 72 hours. Anemia Panel: No results for input(s): VITAMINB12, FOLATE, FERRITIN, TIBC, IRON, RETICCTPCT in the last 72 hours. Sepsis Labs: No results for input(s): PROCALCITON, LATICACIDVEN in the last 168 hours.  Recent  Results (from the past 240 hour(s))  Resp Panel by RT-PCR (Flu A&B, Covid) Nasopharyngeal Swab     Status: None   Collection Time: 03/05/21  4:00 PM   Specimen: Nasopharyngeal Swab; Nasopharyngeal(NP) swabs in vial transport medium  Result Value Ref Range Status   SARS Coronavirus 2 by RT PCR NEGATIVE NEGATIVE Final    Comment: (NOTE) SARS-CoV-2 target nucleic acids are NOT DETECTED.  The SARS-CoV-2 RNA is generally detectable in upper respiratory specimens during the acute phase of infection. The lowest concentration of SARS-CoV-2 viral copies this assay can detect is 138 copies/mL. A negative result does not preclude SARS-Cov-2 infection and should not be used as the sole basis for treatment or other patient management decisions. A negative result may occur with  improper specimen collection/handling, submission of specimen other than nasopharyngeal swab, presence of viral mutation(s) within the areas targeted by this assay, and inadequate number of viral copies(<138 copies/mL). A negative result must be combined with clinical observations, patient history, and epidemiological information. The expected result is Negative.  Fact Sheet for Patients:  EntrepreneurPulse.com.au  Fact Sheet for Healthcare Providers:  IncredibleEmployment.be  This test is no t yet approved or cleared by the [place] FDA and  has been authorized for detection and/or diagnosis of SARS-CoV-2 by FDA under an Emergency Use Authorization (EUA). This EUA will remain  in effect (meaning this test can be used) for the duration of the COVID-19 declaration under Section 564(b)(1) of the Act, 21 U.S.C.section 360bbb-3(b)(1), unless the authorization is terminated  or revoked sooner.       Influenza A by PCR NEGATIVE NEGATIVE Final   Influenza B by PCR NEGATIVE NEGATIVE Final    Comment: (NOTE) The Xpert Xpress SARS-CoV-2/FLU/RSV plus assay is intended as an aid in the  diagnosis of influenza from Nasopharyngeal swab specimens and should not be used as a sole basis for treatment. Nasal washings and aspirates are unacceptable for Xpert Xpress SARS-CoV-2/FLU/RSV testing.  Fact Sheet for Patients: EntrepreneurPulse.com.au  Fact Sheet for Healthcare Providers: IncredibleEmployment.be  This test is not yet approved or cleared by the [place] FDA and has been authorized for detection and/or diagnosis of SARS-CoV-2 by FDA under an Emergency Use Authorization (EUA). This EUA will remain in effect (meaning this test can be used) for the duration of the COVID-19 declaration under Section 564(b)(1) of the Act, 21 U.S.C. section 360bbb-3(b)(1), unless the authorization is terminated or revoked.  Performed at [facility], [address] [place], [place] [address]          Radiology Studies: DG Chest Portable 1 View  Result Date: 03/05/2021 CLINICAL DATA:  Flu like symptoms. EXAM: PORTABLE CHEST 1 VIEW COMPARISON:  August 10, 2018 FINDINGS: There is a multi lead AICD. There is no evidence of focal consolidation, pleural effusion or pneumothorax. The cardiac silhouette is markedly enlarged and unchanged in size. Degenerative changes seen involving the bilateral shoulders and thoracic spine.  IMPRESSION: Stable cardiomegaly without acute or active cardiopulmonary disease. Electronically Signed   By: Virgina Norfolk M.D.   On: 03/05/2021 19:51   CT Renal Stone Study  Result Date: 03/05/2021 CLINICAL DATA:  Unable to urinate since yesterday. Poor appetite. Flu-like virus. EXAM: CT ABDOMEN AND PELVIS WITHOUT CONTRAST TECHNIQUE: Multidetector CT imaging of the abdomen and pelvis was performed following the standard protocol without IV contrast. COMPARISON:  None. FINDINGS: Lower chest: Several scattered tiny calcified granulomas at both lung bases. No acute abnormality at the lung bases. Marked cardiomegaly. ICD lead  seen terminating in the right ventricular apex and coronary sinus. Hepatobiliary: Normal liver size. Two simple liver cysts, largest 1.5 cm in the inferior right liver. Additional scattered subcentimeter hypodense liver lesions are too small to characterize. Mild diffuse gallbladder wall thickening. No radiopaque cholelithiasis. No significant gallbladder distention. No biliary ductal dilatation. Pancreas: Normal, with no mass or duct dilation. Spleen: Normal size. No mass. Adrenals/Urinary Tract: Normal adrenals. No renal stones. No hydronephrosis. No contour deforming renal masses. Normal caliber ureters. No ureteral stones. Bladder completely decompressed by indwelling Foley catheter. Expected gas in the bladder due to instrumentation. Diffuse bladder wall thickening with scattered bladder diverticula, largest 3 cm in the posterior left bladder wall. No bladder stones. Stomach/Bowel: Normal non-distended stomach. Normal caliber small bowel with no small bowel wall thickening. Appendix not discretely visualized. No pericecal inflammatory changes. Moderate sigmoid diverticulosis with no large bowel wall thickening or significant pericolonic fat stranding. Vascular/Lymphatic: Atherosclerotic nonaneurysmal abdominal aorta. No pathologically enlarged lymph nodes in the abdomen or pelvis. Reproductive: Moderate prostatomegaly. Other: No pneumoperitoneum, ascites or focal fluid collection. Musculoskeletal: No aggressive appearing focal osseous lesions. Marked thoracolumbar spondylosis. IMPRESSION: 1. Bladder completely decompressed by indwelling Foley catheter. 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 2. No urolithiasis. No hydronephrosis. 3. Nonspecific mild diffuse gallbladder wall thickening with no radiopaque cholelithiasis, potentially due to noninflammatory edema. 4. Marked cardiomegaly. 5. Moderate  sigmoid diverticulosis. 6. Aortic Atherosclerosis (ICD10-I70.0). Electronically Signed   By: Ilona Sorrel M.D.   On: 03/05/2021 18:46        Scheduled Meds: . carvedilol  3.125 mg Oral BID  . Chlorhexidine Gluconate Cloth  6 each Topical Daily  . tamsulosin  0.4 mg Oral Daily  . warfarin  2 mg Oral ONCE-1600  . Warfarin - Pharmacist Dosing Inpatient   Does not apply q1600   Continuous Infusions: . sodium chloride 30 mL/hr at 03/06/21 1408     LOS: 0 days    Time spent: 41 minutes spent on chart review, discussion with nursing staff, consultants, updating family and interview/physical exam; more than 50% of that time was spent in counseling and/or coordination of care.    Domique Clapper J [place] DO [facility] Available via Epic secure chat 7am-7pm After these hours, please refer to coverage provider listed on [email or web address] 03/06/2021, 2:30 PM

## 2021-03-06 NOTE — Progress Notes (Signed)
Patient called me into the room while on the phone with his wife. wife upset that the patient is a DNR. she said that the patient has a living will. when asked what his wishes are he said he would want interventions done but not to continue extreme measures if he is in a vegetative state. Order received from Dr [place] to discontinue DNR order

## 2021-03-07 LAB — CBC
HCT: 46.8 % (ref 39.0–52.0)
Hemoglobin: 15.2 g/dL (ref 13.0–17.0)
MCH: 28.6 pg (ref 26.0–34.0)
MCHC: 32.5 g/dL (ref 30.0–36.0)
MCV: 88.1 fL (ref 80.0–100.0)
Platelets: 146 10*3/uL — ABNORMAL LOW (ref 150–400)
RBC: 5.31 MIL/uL (ref 4.22–5.81)
RDW: 17.5 % — ABNORMAL HIGH (ref 11.5–15.5)
WBC: 6.9 10*3/uL (ref 4.0–10.5)
nRBC: 0 % (ref 0.0–0.2)

## 2021-03-07 LAB — BASIC METABOLIC PANEL
Anion gap: 10 (ref 5–15)
BUN: 49 mg/dL — ABNORMAL HIGH (ref 8–23)
CO2: 26 mmol/L (ref 22–32)
Calcium: 9.3 mg/dL (ref 8.9–10.3)
Chloride: 101 mmol/L (ref 98–111)
Creatinine, Ser: 1.76 mg/dL — ABNORMAL HIGH (ref 0.61–1.24)
GFR, Estimated: 39 mL/min — ABNORMAL LOW (ref >60)
Glucose, Bld: 91 mg/dL (ref 70–99)
Potassium: 3.9 mmol/L (ref 3.5–5.1)
Sodium: 137 mmol/L (ref 135–145)

## 2021-03-07 LAB — PROTIME-INR
INR: 2.9 — ABNORMAL HIGH (ref 0.8–1.2)
Prothrombin Time: 30.3 seconds — ABNORMAL HIGH (ref 11.4–15.2)

## 2021-03-07 MED ORDER — TAMSULOSIN HCL 0.4 MG PO CAPS: 0.4000 mg | ORAL_CAPSULE | Freq: Every day | ORAL | 0 refills | Status: DC

## 2021-03-07 MED ORDER — WARFARIN SODIUM 3 MG PO TABS
3.0000 mg | ORAL_TABLET | Freq: Once | ORAL | Status: DC
  Filled 2021-03-07: qty 1

## 2021-03-07 MED ORDER — IPRATROPIUM-ALBUTEROL 0.5-2.5 (3) MG/3ML IN SOLN: 3.0000 mL | RESPIRATORY_TRACT | Status: DC | PRN

## 2021-03-07 NOTE — Progress Notes (Addendum)
[facility]  ROUNDING NOTE   Subjective:   Patient seen laying in bed Alert and oriented Tolerating meals Denies nausea and shortness of breath   Objective:  Vital signs in last 24 hours:  Temp:  [97.4 F (36.3 C)-98.1 F (36.7 C)] 97.9 F (36.6 C) (05/04 1130) Pulse Rate:  [57-92] 72 (05/04 1130) Resp:  [18-20] 20 (05/04 1130) BP: (103-118)/(72-93) 103/72 (05/04 1130) SpO2:  [92 %-97 %] 95 % (05/04 1130) Weight:  [80.5 kg] 80.5 kg (05/04 0556)  Weight change: 2.481 kg Filed Weights   03/05/21 1519 03/05/21 2238 03/07/21 0556  Weight: 78 kg 81.6 kg 80.5 kg    Intake/Output: I/O last 3 completed shifts: In: 0350 [P.O.:840; I.V.:393] Out: 2415 [Urine:2415]   Intake/Output this shift:  No intake/output data recorded.  Physical Exam: General: NAD,   Head: Normocephalic, atraumatic. Moist oral mucosal membranes  Eyes: Anicteric  Lungs:  Clear to auscultation  Heart: Regular rate and rhythm  Abdomen:  Soft, nontender,   Extremities:  trace peripheral edema.  Neurologic: Nonfocal, moving all four extremities  Skin: No lesions  GU Foley    Basic Metabolic Panel: Recent Labs  Lab 03/05/21 1600 03/06/21 0412 03/07/21 0531  NA 139 137 137  K 4.4 4.0 3.9  CL 101 100 101  CO2 27 24 26   GLUCOSE 108* 84 91  BUN 45* 51* 49*  CREATININE 2.56* 2.31* 1.76*  CALCIUM 9.9 9.7 9.3    Liver Function Tests: No results for input(s): AST, ALT, ALKPHOS, BILITOT, PROT, ALBUMIN in the last 168 hours. No results for input(s): LIPASE, AMYLASE in the last 168 hours. No results for input(s): AMMONIA in the last 168 hours.  CBC: Recent Labs  Lab 03/05/21 1600 03/06/21 0412 03/07/21 0531  WBC 5.9 6.9 6.9  NEUTROABS 3.7  --   --   HGB 15.2 15.2 15.2  HCT 47.7 45.6 46.8  MCV 91.0 88.5 88.1  PLT 152 149* 146*    Cardiac Enzymes: No results for input(s): CKTOTAL, CKMB, CKMBINDEX, TROPONINI in the last 168 hours.  BNP: Invalid input(s): POCBNP  CBG: No  results for input(s): GLUCAP in the last 168 hours.  Microbiology: Results for orders placed or performed during the hospital encounter of 03/05/21  Resp Panel by RT-PCR (Flu A&B, Covid) Nasopharyngeal Swab     Status: None   Collection Time: 03/05/21  4:00 PM   Specimen: Nasopharyngeal Swab; Nasopharyngeal(NP) swabs in vial transport medium  Result Value Ref Range Status   SARS Coronavirus 2 by RT PCR NEGATIVE NEGATIVE Final    Comment: (NOTE) SARS-CoV-2 target nucleic acids are NOT DETECTED.  The SARS-CoV-2 RNA is generally detectable in upper respiratory specimens during the acute phase of infection. The lowest concentration of SARS-CoV-2 viral copies this assay can detect is 138 copies/mL. A negative result does not preclude SARS-Cov-2 infection and should not be used as the sole basis for treatment or other patient management decisions. A negative result may occur with  improper specimen collection/handling, submission of specimen other than nasopharyngeal swab, presence of viral mutation(s) within the areas targeted by this assay, and inadequate number of viral copies(<138 copies/mL). A negative result must be combined with clinical observations, patient history, and epidemiological information. The expected result is Negative.  Fact Sheet for Patients:  EntrepreneurPulse.com.au  Fact Sheet for Healthcare Providers:  IncredibleEmployment.be  This test is no t yet approved or cleared by the [place] FDA and  has been authorized for detection and/or diagnosis of SARS-CoV-2  by FDA under an Emergency Use Authorization (EUA). This EUA will remain  in effect (meaning this test can be used) for the duration of the COVID-19 declaration under Section 564(b)(1) of the Act, 21 U.S.C.section 360bbb-3(b)(1), unless the authorization is terminated  or revoked sooner.       Influenza A by PCR NEGATIVE NEGATIVE Final   Influenza B by PCR  NEGATIVE NEGATIVE Final    Comment: (NOTE) The Xpert Xpress SARS-CoV-2/FLU/RSV plus assay is intended as an aid in the diagnosis of influenza from Nasopharyngeal swab specimens and should not be used as a sole basis for treatment. Nasal washings and aspirates are unacceptable for Xpert Xpress SARS-CoV-2/FLU/RSV testing.  Fact Sheet for Patients: EntrepreneurPulse.com.au  Fact Sheet for Healthcare Providers: IncredibleEmployment.be  This test is not yet approved or cleared by the [place] FDA and has been authorized for detection and/or diagnosis of SARS-CoV-2 by FDA under an Emergency Use Authorization (EUA). This EUA will remain in effect (meaning this test can be used) for the duration of the COVID-19 declaration under Section 564(b)(1) of the Act, 21 U.S.C. section 360bbb-3(b)(1), unless the authorization is terminated or revoked.  Performed at [facility], [address] [place],  [address]     Coagulation Studies: Recent Labs    03/05/21 01-Mar-2152 03/06/21 0412 03/07/21 0531  LABPROT 31.3* 31.4* 30.3*  INR 3.0* 3.0* 2.9*    Urinalysis: Recent Labs    03/05/21 1349  COLORURINE YELLOW*  LABSPEC 1.014  PHURINE 5.0  GLUCOSEU NEGATIVE  HGBUR SMALL*  BILIRUBINUR NEGATIVE  KETONESUR NEGATIVE  PROTEINUR 30*  NITRITE NEGATIVE  LEUKOCYTESUR NEGATIVE      Imaging: US RENAL  Result Date: 03/06/2021 CLINICAL DATA:  Elevated creatinine EXAM: RENAL / URINARY TRACT ULTRASOUND COMPLETE COMPARISON:  Renal CT 03/05/2021 FINDINGS: Right Kidney: Renal measurements: 10.2 by 5.2 by 5.4 cm = volume: 152 mL. Echogenicity within normal limits. No mass or hydronephrosis visualized. Trace perirenal fluid stranding as shown on recent CT. Left Kidney: Renal measurements: 10.3 by 5.2 by 4.8 cm = volume: 135 mL. Echogenicity within normal limits. No mass or hydronephrosis visualized. Trace perirenal fluid stranding as seen on CT,  nonspecific. Bladder: The thick-walled bladder is collapsed around the indwelling catheter. Other: None. IMPRESSION: 1. No hydronephrosis or hydroureter to indicate obstruction contributing to the patient's current renal insufficiency. Renal echogenicity is within normal limits. Low-level perirenal stranding/fluid, as shown on recent CT. 2. Thick-walled urinary bladder collapsed around the indwelling catheter. Electronically Signed   By: Van Clines M.D.   On: 03/06/2021 18:09   DG Chest Portable 1 View  Result Date: 03/05/2021 CLINICAL DATA:  Flu like symptoms. EXAM: PORTABLE CHEST 1 VIEW COMPARISON:  August 10, 2018 FINDINGS: There is a multi lead AICD. There is no evidence of focal consolidation, pleural effusion or pneumothorax. The cardiac silhouette is markedly enlarged and unchanged in size. Degenerative changes seen involving the bilateral shoulders and thoracic spine. IMPRESSION: Stable cardiomegaly without acute or active cardiopulmonary disease. Electronically Signed   By: Virgina Norfolk M.D.   On: 03/05/2021 19:51   CT Renal Stone Study  Result Date: 03/05/2021 CLINICAL DATA:  Unable to urinate since yesterday. Poor appetite. Flu-like virus. EXAM: CT ABDOMEN AND PELVIS WITHOUT CONTRAST TECHNIQUE: Multidetector CT imaging of the abdomen and pelvis was performed following the standard protocol without IV contrast. COMPARISON:  None. FINDINGS: Lower chest: Several scattered tiny calcified granulomas at both lung bases. No acute abnormality at the lung bases. Marked cardiomegaly. ICD lead seen  terminating in the right ventricular apex and coronary sinus. Hepatobiliary: Normal liver size. Two simple liver cysts, largest 1.5 cm in the inferior right liver. Additional scattered subcentimeter hypodense liver lesions are too small to characterize. Mild diffuse gallbladder wall thickening. No radiopaque cholelithiasis. No significant gallbladder distention. No biliary ductal dilatation. Pancreas:  Normal, with no mass or duct dilation. Spleen: Normal size. No mass. Adrenals/Urinary Tract: Normal adrenals. No renal stones. No hydronephrosis. No contour deforming renal masses. Normal caliber ureters. No ureteral stones. Bladder completely decompressed by indwelling Foley catheter. Expected gas in the bladder due to instrumentation. Diffuse bladder wall thickening with scattered bladder diverticula, largest 3 cm in the posterior left bladder wall. No bladder stones. Stomach/Bowel: Normal non-distended stomach. Normal caliber small bowel with no small bowel wall thickening. Appendix not discretely visualized. No pericecal inflammatory changes. Moderate sigmoid diverticulosis with no large bowel wall thickening or significant pericolonic fat stranding. Vascular/Lymphatic: Atherosclerotic nonaneurysmal abdominal aorta. No pathologically enlarged lymph nodes in the abdomen or pelvis. Reproductive: Moderate prostatomegaly. Other: No pneumoperitoneum, ascites or focal fluid collection. Musculoskeletal: No aggressive appearing focal osseous lesions. Marked thoracolumbar spondylosis. IMPRESSION: 1. Bladder completely decompressed by indwelling Foley catheter. 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 2. No urolithiasis. No hydronephrosis. 3. Nonspecific mild diffuse gallbladder wall thickening with no radiopaque cholelithiasis, potentially due to noninflammatory edema. 4. Marked cardiomegaly. 5. Moderate sigmoid diverticulosis. 6. Aortic Atherosclerosis (ICD10-I70.0). Electronically Signed   By: Ilona Sorrel M.D.   On: 03/05/2021 18:46     Medications:    . carvedilol  3.125 mg Oral BID  . Chlorhexidine Gluconate Cloth  6 each Topical Daily  . tamsulosin  0.4 mg Oral Daily  . warfarin  3 mg Oral ONCE-1600  . Warfarin - Pharmacist Dosing Inpatient   Does not apply q1600   acetaminophen **OR**  acetaminophen, albuterol, ALPRAZolam, ondansetron **OR** ondansetron (ZOFRAN) IV  Assessment/ Plan:  Mr. Adrian Chavez is a 80 y.o.  male   1. Acute Kidney Injury likely due to urinary retention and potential overdiuresis: baseline creatinine 1.02 on 08/10/18.  No acute need for dialysis at this time Flomax started-appreciate Urology recs Will d/c with foley Creatinine level 1.76 today UOP 1.2L D/c IVF Cleared for discharge from renal with outpatient follow up.   Lab Results  Component Value Date   CREATININE 1.76 (H) 03/07/2021   CREATININE 2.31 (H) 03/06/2021   CREATININE 2.56 (H) 03/05/2021    Intake/Output Summary (Last 24 hours) at 03/07/2021 1224 Last data filed at 03/07/2021 0608 Gross per 24 hour  Intake 992.97 ml  Output 1415 ml  Net -422.03 ml   2. Hypertension - BP continues to be soft, asymptomatic     LOS: 1 Landrum Carbonell 5/4/202212:24 PM

## 2021-03-07 NOTE — Progress Notes (Deleted)
[facility]  ROUNDING NOTE   Subjective:   Patient seen laying in bed Alert and oriented Tolerating meals Denies nausea and shortness of breath   Objective:  Vital signs in last 24 hours:  Temp:  [97.4 F (36.3 C)-98.1 F (36.7 C)] 97.9 F (36.6 C) (05/04 1130) Pulse Rate:  [57-92] 72 (05/04 1130) Resp:  [18-20] 20 (05/04 1130) BP: (103-118)/(72-93) 103/72 (05/04 1130) SpO2:  [92 %-97 %] 95 % (05/04 1130) Weight:  [80.5 kg] 80.5 kg (05/04 0556)  Weight change: 2.481 kg Filed Weights   03/05/21 1519 03/05/21 2238 03/07/21 0556  Weight: 78 kg 81.6 kg 80.5 kg    Intake/Output: I/O last 3 completed shifts: In: 8144 [P.O.:840; I.V.:393] Out: 2415 [Urine:2415]   Intake/Output this shift:  No intake/output data recorded.  Physical Exam: General: NAD,   Head: Normocephalic, atraumatic. Moist oral mucosal membranes  Eyes: Anicteric  Lungs:  Clear to auscultation  Heart: Regular rate and rhythm  Abdomen:  Soft, nontender,   Extremities:  trace peripheral edema.  Neurologic: Nonfocal, moving all four extremities  Skin: No lesions       Basic Metabolic Panel: Recent Labs  Lab 03/05/21 1600 03/06/21 0412 03/07/21 0531  NA 139 137 137  K 4.4 4.0 3.9  CL 101 100 101  CO2 27 24 26   GLUCOSE 108* 84 91  BUN 45* 51* 49*  CREATININE 2.56* 2.31* 1.76*  CALCIUM 9.9 9.7 9.3    Liver Function Tests: No results for input(s): AST, ALT, ALKPHOS, BILITOT, PROT, ALBUMIN in the last 168 hours. No results for input(s): LIPASE, AMYLASE in the last 168 hours. No results for input(s): AMMONIA in the last 168 hours.  CBC: Recent Labs  Lab 03/05/21 1600 03/06/21 0412 03/07/21 0531  WBC 5.9 6.9 6.9  NEUTROABS 3.7  --   --   HGB 15.2 15.2 15.2  HCT 47.7 45.6 46.8  MCV 91.0 88.5 88.1  PLT 152 149* 146*    Cardiac Enzymes: No results for input(s): CKTOTAL, CKMB, CKMBINDEX, TROPONINI in the last 168 hours.  BNP: Invalid input(s): POCBNP  CBG: No  results for input(s): GLUCAP in the last 168 hours.  Microbiology: Results for orders placed or performed during the hospital encounter of 03/05/21  Resp Panel by RT-PCR (Flu A&B, Covid) Nasopharyngeal Swab     Status: None   Collection Time: 03/05/21  4:00 PM   Specimen: Nasopharyngeal Swab; Nasopharyngeal(NP) swabs in vial transport medium  Result Value Ref Range Status   SARS Coronavirus 2 by RT PCR NEGATIVE NEGATIVE Final    Comment: (NOTE) SARS-CoV-2 target nucleic acids are NOT DETECTED.  The SARS-CoV-2 RNA is generally detectable in upper respiratory specimens during the acute phase of infection. The lowest concentration of SARS-CoV-2 viral copies this assay can detect is 138 copies/mL. A negative result does not preclude SARS-Cov-2 infection and should not be used as the sole basis for treatment or other patient management decisions. A negative result may occur with  improper specimen collection/handling, submission of specimen other than nasopharyngeal swab, presence of viral mutation(s) within the areas targeted by this assay, and inadequate number of viral copies(<138 copies/mL). A negative result must be combined with clinical observations, patient history, and epidemiological information. The expected result is Negative.  Fact Sheet for Patients:  EntrepreneurPulse.com.au  Fact Sheet for Healthcare Providers:  IncredibleEmployment.be  This test is no t yet approved or cleared by the [place] FDA and  has been authorized for detection and/or diagnosis of SARS-CoV-2  by FDA under an Emergency Use Authorization (EUA). This EUA will remain  in effect (meaning this test can be used) for the duration of the COVID-19 declaration under Section 564(b)(1) of the Act, 21 U.S.C.section 360bbb-3(b)(1), unless the authorization is terminated  or revoked sooner.       Influenza A by PCR NEGATIVE NEGATIVE Final   Influenza B by PCR  NEGATIVE NEGATIVE Final    Comment: (NOTE) The Xpert Xpress SARS-CoV-2/FLU/RSV plus assay is intended as an aid in the diagnosis of influenza from Nasopharyngeal swab specimens and should not be used as a sole basis for treatment. Nasal washings and aspirates are unacceptable for Xpert Xpress SARS-CoV-2/FLU/RSV testing.  Fact Sheet for Patients: EntrepreneurPulse.com.au  Fact Sheet for Healthcare Providers: IncredibleEmployment.be  This test is not yet approved or cleared by the [place] FDA and has been authorized for detection and/or diagnosis of SARS-CoV-2 by FDA under an Emergency Use Authorization (EUA). This EUA will remain in effect (meaning this test can be used) for the duration of the COVID-19 declaration under Section 564(b)(1) of the Act, 21 U.S.C. section 360bbb-3(b)(1), unless the authorization is terminated or revoked.  Performed at [facility], [address] [place],  [address]     Coagulation Studies: Recent Labs    03/05/21 01-Mar-2152 03/06/21 0412 03/07/21 0531  LABPROT 31.3* 31.4* 30.3*  INR 3.0* 3.0* 2.9*    Urinalysis: Recent Labs    03/05/21 1349  COLORURINE YELLOW*  LABSPEC 1.014  PHURINE 5.0  GLUCOSEU NEGATIVE  HGBUR SMALL*  BILIRUBINUR NEGATIVE  KETONESUR NEGATIVE  PROTEINUR 30*  NITRITE NEGATIVE  LEUKOCYTESUR NEGATIVE      Imaging: US RENAL  Result Date: 03/06/2021 CLINICAL DATA:  Elevated creatinine EXAM: RENAL / URINARY TRACT ULTRASOUND COMPLETE COMPARISON:  Renal CT 03/05/2021 FINDINGS: Right Kidney: Renal measurements: 10.2 by 5.2 by 5.4 cm = volume: 152 mL. Echogenicity within normal limits. No mass or hydronephrosis visualized. Trace perirenal fluid stranding as shown on recent CT. Left Kidney: Renal measurements: 10.3 by 5.2 by 4.8 cm = volume: 135 mL. Echogenicity within normal limits. No mass or hydronephrosis visualized. Trace perirenal fluid stranding as seen on CT,  nonspecific. Bladder: The thick-walled bladder is collapsed around the indwelling catheter. Other: None. IMPRESSION: 1. No hydronephrosis or hydroureter to indicate obstruction contributing to the patient's current renal insufficiency. Renal echogenicity is within normal limits. Low-level perirenal stranding/fluid, as shown on recent CT. 2. Thick-walled urinary bladder collapsed around the indwelling catheter. Electronically Signed   By: Van Clines M.D.   On: 03/06/2021 18:09   DG Chest Portable 1 View  Result Date: 03/05/2021 CLINICAL DATA:  Flu like symptoms. EXAM: PORTABLE CHEST 1 VIEW COMPARISON:  August 10, 2018 FINDINGS: There is a multi lead AICD. There is no evidence of focal consolidation, pleural effusion or pneumothorax. The cardiac silhouette is markedly enlarged and unchanged in size. Degenerative changes seen involving the bilateral shoulders and thoracic spine. IMPRESSION: Stable cardiomegaly without acute or active cardiopulmonary disease. Electronically Signed   By: Virgina Norfolk M.D.   On: 03/05/2021 19:51   CT Renal Stone Study  Result Date: 03/05/2021 CLINICAL DATA:  Unable to urinate since yesterday. Poor appetite. Flu-like virus. EXAM: CT ABDOMEN AND PELVIS WITHOUT CONTRAST TECHNIQUE: Multidetector CT imaging of the abdomen and pelvis was performed following the standard protocol without IV contrast. COMPARISON:  None. FINDINGS: Lower chest: Several scattered tiny calcified granulomas at both lung bases. No acute abnormality at the lung bases. Marked cardiomegaly. ICD lead seen  terminating in the right ventricular apex and coronary sinus. Hepatobiliary: Normal liver size. Two simple liver cysts, largest 1.5 cm in the inferior right liver. Additional scattered subcentimeter hypodense liver lesions are too small to characterize. Mild diffuse gallbladder wall thickening. No radiopaque cholelithiasis. No significant gallbladder distention. No biliary ductal dilatation. Pancreas:  Normal, with no mass or duct dilation. Spleen: Normal size. No mass. Adrenals/Urinary Tract: Normal adrenals. No renal stones. No hydronephrosis. No contour deforming renal masses. Normal caliber ureters. No ureteral stones. Bladder completely decompressed by indwelling Foley catheter. Expected gas in the bladder due to instrumentation. Diffuse bladder wall thickening with scattered bladder diverticula, largest 3 cm in the posterior left bladder wall. No bladder stones. Stomach/Bowel: Normal non-distended stomach. Normal caliber small bowel with no small bowel wall thickening. Appendix not discretely visualized. No pericecal inflammatory changes. Moderate sigmoid diverticulosis with no large bowel wall thickening or significant pericolonic fat stranding. Vascular/Lymphatic: Atherosclerotic nonaneurysmal abdominal aorta. No pathologically enlarged lymph nodes in the abdomen or pelvis. Reproductive: Moderate prostatomegaly. Other: No pneumoperitoneum, ascites or focal fluid collection. Musculoskeletal: No aggressive appearing focal osseous lesions. Marked thoracolumbar spondylosis. IMPRESSION: 1. Bladder completely decompressed by indwelling Foley catheter. 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 2. No urolithiasis. No hydronephrosis. 3. Nonspecific mild diffuse gallbladder wall thickening with no radiopaque cholelithiasis, potentially due to noninflammatory edema. 4. Marked cardiomegaly. 5. Moderate sigmoid diverticulosis. 6. Aortic Atherosclerosis (ICD10-I70.0). Electronically Signed   By: Ilona Sorrel M.D.   On: 03/05/2021 18:46     Medications:    . carvedilol  3.125 mg Oral BID  . Chlorhexidine Gluconate Cloth  6 each Topical Daily  . tamsulosin  0.4 mg Oral Daily  . warfarin  3 mg Oral ONCE-1600  . Warfarin - Pharmacist Dosing Inpatient   Does not apply q1600   acetaminophen **OR**  acetaminophen, albuterol, ALPRAZolam, ondansetron **OR** ondansetron (ZOFRAN) IV  Assessment/ Plan:  Mr. Adrian Chavez is a 80 y.o.  male   1. Acute Kidney Injury likely due to urinary retention and potential overdiuresis: baseline creatinine 1.02 on 08/10/18.  No acute need for dialysis at this time Flomax started-appreciate Urology recs Creatinine level 1.76 today UOP 1.2L D/c IVF Cleared for discharge from renal with outpatient follow up.   Lab Results  Component Value Date   CREATININE 1.76 (H) 03/07/2021   CREATININE 2.31 (H) 03/06/2021   CREATININE 2.56 (H) 03/05/2021    Intake/Output Summary (Last 24 hours) at 03/07/2021 1215 Last data filed at 03/07/2021 4650 Gross per 24 hour  Intake 992.97 ml  Output 1415 ml  Net -422.03 ml   2. Hypertension - BP continues to be soft, asymptomatic     LOS: 1 Vanderbilt Ranieri 5/4/202212:15 PM

## 2021-03-07 NOTE — Progress Notes (Signed)
Patient a family given foley care instructions, when to return for worsening symptoms, IV taken out, patient had some shortness of breath and wheezing Albuterol inhaler given per [facility], & MD aware and ok with discharge, & patient discharged via wheelchair.

## 2021-03-07 NOTE — Discharge Instructions (Signed)
Acute Urinary Retention, Male  Acute urinary retention is when a person cannot pee (urinate) at all, or can only pee a little. This can come on all of a sudden. If it is not treated, it can lead to kidney problems or other serious problems. What are the causes?  A problem with the tube that drains the bladder (urethra).  Problems with the nerves in the bladder.  Tumors.  Certain medicines.  An infection.  Having trouble pooping (constipation). What increases the risk? Older men are more at risk because their prostate gland may become larger as they age. Other conditions also can increase risk. These include:  Diseases, such as multiple sclerosis.  Injury to the spinal cord.  Diabetes.  A condition that affects the way the brain works, such as dementia.  Holding back urine due to trauma or because you do not want to use the bathroom. What are the signs or symptoms?  Trouble peeing.  Pain in the lower belly. How is this treated? Treatment for this condition may include:  Medicines.  Placing a thin, germ-free tube (catheter) into the bladder to drain pee out of the body.  Therapy to treat mental health conditions.  Treatment for conditions that may cause this. If needed, you may be treated in the hospital for kidney problems or to manage other problems. Follow these instructions at home: Medicines  Take over-the-counter and prescription medicines only as told by your doctor. Ask your doctor what medicines you should stay away from.  If you were given an antibiotic medicine, take it as told by your doctor. Do not stop taking it, even if you start to feel better. General instructions  Do not smoke or use any products that contain nicotine or tobacco. If you need help quitting, ask your doctor.  Drink enough fluid to keep your pee pale yellow.  If you were sent home with a tube that drains the bladder, take care of it as told by your doctor.  Watch for changes in  your symptoms. Tell your doctor about them.  If told, keep track of changes in your blood pressure at home. Tell your doctor about them.  Keep all follow-up visits. Contact a doctor if:  You have spasms in your bladder that you cannot stop.  You leak pee when you have spasms. Get help right away if:  You have chills or a fever.  You have blood in your pee.  You have a tube that drains pee from the bladder and these things happen: ? The tube stops draining pee. ? The tube falls out. Summary  Acute urinary retention is when you cannot pee at all or you pee too little.  If this condition is not treated, it can lead to kidney problems or other serious problems.  If you were sent home with a tube (catheter) that drains the bladder, take care of it as told by your doctor.  Watch for changes in your symptoms. Tell your doctor about them. This information is not intended to replace advice given to you by your health care provider. Make sure you discuss any questions you have with your health care provider. Document Revised: 07/12/2020 Document Reviewed: 07/12/2020 Elsevier Patient Education  2021 Elsevier Inc.  

## 2021-03-07 NOTE — Discharge Summary (Signed)
Physician Discharge Summary  GWIN EAGON PXT:[id number] DOB: 02-04-1941 DOA: 03/05/2021  PCP: Dion Body, MD  Admit date: 03/05/2021 Discharge date: 03/07/2021  Admitted From: Home Disposition: Home  Recommendations for Outpatient Follow-up:  1. Follow up with PCP in 1 week with repeat CBC/BMP 2. Outpatient follow-up with nephrology/cardiology and urology 3. 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  On presentation, BNP was greater than 4500 and creatinine was 2.56.  Urinalysis was nonrevealing.  Chest x-ray with stable cardiomegaly without acute cardiopulmonary disease process. CT renal stone study with bladder decompressed by indwelling Foley catheter with diffuse bladder wall thickening and scattered bladder diverticuli compatible with chronic bladder outlet obstruction, markedly enlarged prostate, no hydronephrosis.  Foley catheter was placed in the ED.  Nephrology and urology were consulted.  Patient was treated with gentle hydration.  Urology recommended to continue Foley catheter and outpatient follow-up with urology.  Flomax was also started.  Cardiology was also consulted.  During the hospitalization, renal function has improved.  Cardiology and nephrology have cleared the patient for discharge.  He will be discharged home today with outpatient follow-up with urology/nephrology and cardiology.  Discharge Diagnoses:   Acute urinary retention Acute  renal failure BPH -Presented with urinary retention and creatinine of 2.56; baseline of 1.02. - CT abdomen/pelvis notable for chronic bladder wall thickening with diverticuli consistent with chronic obstruction and mildly enlarged prostate.  Foley catheter was placed in the ED.   - Nephrology and urology were consulted.  Patient was treated with gentle hydration.  Urology recommended to continue Foley catheter and outpatient follow-up with urology.  Flomax was also started.  -Creatinine has improved to 1.76 today.  Nephrology has cleared the patient for discharge with outpatient follow-up with nephrology. -Continue Flomax which has been started during this hospitalization.  Chronic systolic heart failure Nonischemic dilated cardiomyopathy Severe mitral regurgitation with pulmonary hypertension -Cardiology following and has cleared the patient for discharge. -will continue to hold metolazone and Entresto upon discharge.  This can be resumed as an outpatient by cardiology or nephrology if renal functions improve.  We will resume torsemide upon discharge as per nephrology/cardiology recommendations.  Outpatient follow-up with cardiology. -Continue Coreg  Paroxysmal A. Fib -Continue Coreg.  Continue Coumadin.  Outpatient follow-up of INR  Anxiety -Continue as needed alprazolam  Essential hypertension -Antihypertensive plan as above  Discharge Instructions  Discharge Instructions    Diet - low sodium heart healthy   Complete by: As directed    Increase activity slowly   Complete by: As directed      Allergies as of 03/07/2021      Reactions   Oxycodone    vomiting      Medication List    STOP taking these medications   metolazone 5 MG tablet Commonly known as: ZAROXOLYN   sacubitril-valsartan 24-26 MG Commonly known as: ENTRESTO     TAKE these medications   albuterol 108 (90 Base) MCG/ACT inhaler Commonly known as: VENTOLIN HFA SMARTSIG:1-2 Inhalation Via Inhaler Every 4  Hours PRN   ALPRAZolam 0.5 MG tablet Commonly  known as: XANAX Take 0.5 mg by mouth daily as needed for anxiety.   azelastine 0.1 % nasal spray Commonly known as: ASTELIN Place into the nose.   carvedilol 3.125 MG tablet Commonly known as: COREG Take 3.125 mg by mouth 2 (two) times daily.   cetirizine 10 MG tablet Commonly known as: ZYRTEC Take 1 tablet by mouth daily.   Cialis 5 MG tablet Generic drug: tadalafil Take 5 mg by mouth daily.   fluticasone 50 MCG/ACT nasal spray Commonly known as: FLONASE Place 2 sprays into both nostrils daily as needed for allergies or rhinitis.   potassium chloride SA 20 MEQ tablet Commonly known as: KLOR-CON Take 20 mEq by mouth 2 (two) times daily.   tamsulosin 0.4 MG Caps capsule Commonly known as: FLOMAX Take 1 capsule (0.4 mg total) by mouth daily. Start taking on: Mar 08, 2021   torsemide 20 MG tablet Commonly known as: DEMADEX Take 20 mg by mouth 2 (two) times daily.   vitamin B-12 1000 MCG tablet Commonly known as: CYANOCOBALAMIN Take 1,000 mcg by mouth daily.   warfarin 1 MG tablet Commonly known as: COUMADIN Take 3-4 mg by mouth See admin instructions. Take 2 MG by mouth every Monday, Tuesday, 3 mg Wednesday, 2 mg Thursday and Friday evening and 3 MG by mouth every Saturday and Sunday evening Notes to patient: INR was 2.9 for 03/07/21       Follow-up Information    Dion Body, MD. Go on 03/13/2021.   Specialty: Family Medicine Why: 10:30am appointment Contact information: [place] [address] [phone]        Abbie Sons, MD. Schedule an appointment as soon as possible for a visit in 1 week.   Specialty: Urology Why: left message for them to call patient Contact information: [place] [address] [phone]        Yolonda Kida, MD. Go on 03/16/2021.   Specialties: Cardiology, Internal Medicine Why: 9am  appointment Contact information: [place] [address] [place], [place], MD. Schedule an appointment as soon as possible for a visit in 1 week.   Specialty: Nephrology Contact information: [place] [address] [phone]              Allergies  Allergen Reactions  . Oxycodone     vomiting    Consultations:  Urology/nephrology/cardiology   Procedures/Studies: US RENAL  Result Date: 03/06/2021 CLINICAL DATA:  Elevated creatinine EXAM: RENAL / URINARY TRACT ULTRASOUND COMPLETE COMPARISON:  Renal CT 03/05/2021 FINDINGS: Right Kidney: Renal measurements: 10.2 by 5.2 by 5.4 cm = volume: 152 mL. Echogenicity within normal limits. No mass or hydronephrosis visualized. Trace perirenal fluid stranding as shown on recent CT. Left Kidney: Renal measurements: 10.3 by 5.2 by 4.8 cm = volume: 135 mL. Echogenicity within normal limits. No mass or hydronephrosis visualized. Trace perirenal fluid stranding as seen on CT, nonspecific. Bladder: The thick-walled bladder is collapsed around the indwelling catheter. Other: None. IMPRESSION: 1. No hydronephrosis or hydroureter to indicate obstruction contributing to the patient's current renal insufficiency. Renal echogenicity is within normal limits. Low-level perirenal stranding/fluid, as shown on recent CT. 2. Thick-walled urinary bladder collapsed around the indwelling catheter. Electronically Signed   By: Van Clines M.D.   On: 03/06/2021 18:09   DG Chest Portable 1 View  Result Date: 03/05/2021 CLINICAL DATA:  Flu like  symptoms. EXAM: PORTABLE CHEST 1 VIEW COMPARISON:  August 10, 2018 FINDINGS: There is a multi lead AICD. There is no evidence of focal consolidation, pleural effusion or pneumothorax. The cardiac silhouette is markedly enlarged and unchanged in size. Degenerative changes seen involving the bilateral shoulders and thoracic spine. IMPRESSION: Stable cardiomegaly  without acute or active cardiopulmonary disease. Electronically Signed   By: Virgina Norfolk M.D.   On: 03/05/2021 19:51   CT Renal Stone Study  Result Date: 03/05/2021 CLINICAL DATA:  Unable to urinate since yesterday. Poor appetite. Flu-like virus. EXAM: CT ABDOMEN AND PELVIS WITHOUT CONTRAST TECHNIQUE: Multidetector CT imaging of the abdomen and pelvis was performed following the standard protocol without IV contrast. COMPARISON:  None. FINDINGS: Lower chest: Several scattered tiny calcified granulomas at both lung bases. No acute abnormality at the lung bases. Marked cardiomegaly. ICD lead seen terminating in the right ventricular apex and coronary sinus. Hepatobiliary: Normal liver size. Two simple liver cysts, largest 1.5 cm in the inferior right liver. Additional scattered subcentimeter hypodense liver lesions are too small to characterize. Mild diffuse gallbladder wall thickening. No radiopaque cholelithiasis. No significant gallbladder distention. No biliary ductal dilatation. Pancreas: Normal, with no mass or duct dilation. Spleen: Normal size. No mass. Adrenals/Urinary Tract: Normal adrenals. No renal stones. No hydronephrosis. No contour deforming renal masses. Normal caliber ureters. No ureteral stones. Bladder completely decompressed by indwelling Foley catheter. Expected gas in the bladder due to instrumentation. Diffuse bladder wall thickening with scattered bladder diverticula, largest 3 cm in the posterior left bladder wall. No bladder stones. Stomach/Bowel: Normal non-distended stomach. Normal caliber small bowel with no small bowel wall thickening. Appendix not discretely visualized. No pericecal inflammatory changes. Moderate sigmoid diverticulosis with no large bowel wall thickening or significant pericolonic fat stranding. Vascular/Lymphatic: Atherosclerotic nonaneurysmal abdominal aorta. No pathologically enlarged lymph nodes in the abdomen or pelvis. Reproductive: Moderate  prostatomegaly. Other: No pneumoperitoneum, ascites or focal fluid collection. Musculoskeletal: No aggressive appearing focal osseous lesions. Marked thoracolumbar spondylosis. IMPRESSION: 1. Bladder completely decompressed by indwelling Foley catheter. 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 2. No urolithiasis. No hydronephrosis. 3. Nonspecific mild diffuse gallbladder wall thickening with no radiopaque cholelithiasis, potentially due to noninflammatory edema. 4. Marked cardiomegaly. 5. Moderate sigmoid diverticulosis. 6. Aortic Atherosclerosis (ICD10-I70.0). Electronically Signed   By: Ilona Sorrel M.D.   On: 03/05/2021 18:46       Subjective: Patient seen and examined at bedside.  Poor historian.  Feels much better and wants to go home today.  No overnight fever or vomiting reported.  Discharge Exam: Vitals:   03/07/21 0854 03/07/21 1130  BP: 114/83 103/72  Pulse: 92 72  Resp: 20 20  Temp: (!) 97.4 F (36.3 C) 97.9 F (36.6 C)  SpO2: 94% 95%    General: Pt is alert, awake, not in acute distress.  Poor historian.  Currently on room air. Cardiovascular: rate controlled, S1/S2 + Respiratory: bilateral decreased breath sounds at bases with some scattered crackles Abdominal: Soft, NT, ND, bowel sounds + Extremities: Trace lower extremity edema; no cyanosis    The results of significant diagnostics from this hospitalization (including imaging, microbiology, ancillary and laboratory) are listed below for reference.     Microbiology: Recent Results (from the past 240 hour(s))  Resp Panel by RT-PCR (Flu A&B, Covid) Nasopharyngeal Swab     Status: None   Collection Time: 03/05/21  4:00 PM   Specimen: Nasopharyngeal Swab; Nasopharyngeal(NP) swabs in  vial transport medium  Result Value Ref Range Status   SARS Coronavirus 2 by RT PCR NEGATIVE NEGATIVE Final    Comment:  (NOTE) SARS-CoV-2 target nucleic acids are NOT DETECTED.  The SARS-CoV-2 RNA is generally detectable in upper respiratory specimens during the acute phase of infection. The lowest concentration of SARS-CoV-2 viral copies this assay can detect is 138 copies/mL. A negative result does not preclude SARS-Cov-2 infection and should not be used as the sole basis for treatment or other patient management decisions. A negative result may occur with  improper specimen collection/handling, submission of specimen other than nasopharyngeal swab, presence of viral mutation(s) within the areas targeted by this assay, and inadequate number of viral copies(<138 copies/mL). A negative result must be combined with clinical observations, patient history, and epidemiological information. The expected result is Negative.  Fact Sheet for Patients:  EntrepreneurPulse.com.au  Fact Sheet for Healthcare Providers:  IncredibleEmployment.be  This test is no t yet approved or cleared by the [place] FDA and  has been authorized for detection and/or diagnosis of SARS-CoV-2 by FDA under an Emergency Use Authorization (EUA). This EUA will remain  in effect (meaning this test can be used) for the duration of the COVID-19 declaration under Section 564(b)(1) of the Act, 21 U.S.C.section 360bbb-3(b)(1), unless the authorization is terminated  or revoked sooner.       Influenza A by PCR NEGATIVE NEGATIVE Final   Influenza B by PCR NEGATIVE NEGATIVE Final    Comment: (NOTE) The Xpert Xpress SARS-CoV-2/FLU/RSV plus assay is intended as an aid in the diagnosis of influenza from Nasopharyngeal swab specimens and should not be used as a sole basis for treatment. Nasal washings and aspirates are unacceptable for Xpert Xpress SARS-CoV-2/FLU/RSV testing.  Fact Sheet for Patients: EntrepreneurPulse.com.au  Fact Sheet for Healthcare  Providers: IncredibleEmployment.be  This test is not yet approved or cleared by the [place] FDA and has been authorized for detection and/or diagnosis of SARS-CoV-2 by FDA under an Emergency Use Authorization (EUA). This EUA will remain in effect (meaning this test can be used) for the duration of the COVID-19 declaration under Section 564(b)(1) of the Act, 21 U.S.C. section 360bbb-3(b)(1), unless the authorization is terminated or revoked.  Performed at [facility], [address] [place], [place] [address]      Labs: BNP (last 3 results) Recent Labs    02/02/21 1122 03/05/21 1600  BNP >4,500.0* [id number]*   Basic Metabolic Panel: Recent Labs  Lab 03/05/21 1600 03/06/21 0412 03/07/21 0531  NA 139 137 137  K 4.4 4.0 3.9  CL 101 100 101  CO2 27 24 26   GLUCOSE 108* 84 91  BUN 45* 51* 49*  CREATININE 2.56* 2.31* 1.76*  CALCIUM 9.9 9.7 9.3   Liver Function Tests: No results for input(s): AST, ALT, ALKPHOS, BILITOT, PROT, ALBUMIN in the last 168 hours. No results for input(s): LIPASE, AMYLASE in the last 168 hours. No results for input(s): AMMONIA in the last 168 hours. CBC: Recent Labs  Lab 03/05/21 1600 03/06/21 0412 03/07/21 0531  WBC 5.9 6.9 6.9  NEUTROABS 3.7  --   --   HGB 15.2 15.2 15.2  HCT 47.7 45.6 46.8  MCV 91.0 88.5 88.1  PLT 152 149* 146*   Cardiac Enzymes: No results for input(s): CKTOTAL, CKMB, CKMBINDEX, TROPONINI in the last 168 hours. BNP: Invalid input(s): POCBNP CBG: No results for input(s): GLUCAP in the last 168 hours. D-Dimer No results for input(s): DDIMER in the last 72 hours.  Hgb A1c No results for input(s): HGBA1C in the last 72 hours. Lipid Profile No results for input(s): CHOL, HDL, LDLCALC, TRIG, CHOLHDL, LDLDIRECT in the last 72 hours. Thyroid function studies No results for input(s): TSH, T4TOTAL, T3FREE, THYROIDAB in the last 72 hours.  Invalid input(s): FREET3 Anemia work up No  results for input(s): VITAMINB12, FOLATE, FERRITIN, TIBC, IRON, RETICCTPCT in the last 72 hours. Urinalysis    Component Value Date/Time   COLORURINE YELLOW (A) 03/05/2021 1349   APPEARANCEUR CLEAR (A) 03/05/2021 1349   APPEARANCEUR Clear 04/07/2012 1629   LABSPEC 1.014 03/05/2021 1349   LABSPEC 1.014 04/07/2012 1629   PHURINE 5.0 03/05/2021 1349   GLUCOSEU NEGATIVE 03/05/2021 1349   GLUCOSEU Negative 04/07/2012 1629   HGBUR SMALL (A) 03/05/2021 1349   BILIRUBINUR NEGATIVE 03/05/2021 1349   BILIRUBINUR Negative 04/07/2012 1629   KETONESUR NEGATIVE 03/05/2021 1349   PROTEINUR 30 (A) 03/05/2021 1349   NITRITE NEGATIVE 03/05/2021 1349   LEUKOCYTESUR NEGATIVE 03/05/2021 1349   LEUKOCYTESUR Negative 04/07/2012 1629   Sepsis Labs Invalid input(s): PROCALCITONIN,  WBC,  LACTICIDVEN Microbiology Recent Results (from the past 240 hour(s))  Resp Panel by RT-PCR (Flu A&B, Covid) Nasopharyngeal Swab     Status: None   Collection Time: 03/05/21  4:00 PM   Specimen: Nasopharyngeal Swab; Nasopharyngeal(NP) swabs in vial transport medium  Result Value Ref Range Status   SARS Coronavirus 2 by RT PCR NEGATIVE NEGATIVE Final    Comment: (NOTE) SARS-CoV-2 target nucleic acids are NOT DETECTED.  The SARS-CoV-2 RNA is generally detectable in upper respiratory specimens during the acute phase of infection. The lowest concentration of SARS-CoV-2 viral copies this assay can detect is 138 copies/mL. A negative result does not preclude SARS-Cov-2 infection and should not be used as the sole basis for treatment or other patient management decisions. A negative result may occur with  improper specimen collection/handling, submission of specimen other than nasopharyngeal swab, presence of viral mutation(s) within the areas targeted by this assay, and inadequate number of viral copies(<138 copies/mL). A negative result must be combined with clinical observations, patient history, and  epidemiological information. The expected result is Negative.  Fact Sheet for Patients:  EntrepreneurPulse.com.au  Fact Sheet for Healthcare Providers:  IncredibleEmployment.be  This test is no t yet approved or cleared by the [place] FDA and  has been authorized for detection and/or diagnosis of SARS-CoV-2 by FDA under an Emergency Use Authorization (EUA). This EUA will remain  in effect (meaning this test can be used) for the duration of the COVID-19 declaration under Section 564(b)(1) of the Act, 21 U.S.C.section 360bbb-3(b)(1), unless the authorization is terminated  or revoked sooner.       Influenza A by PCR NEGATIVE NEGATIVE Final   Influenza B by PCR NEGATIVE NEGATIVE Final    Comment: (NOTE) The Xpert Xpress SARS-CoV-2/FLU/RSV plus assay is intended as an aid in the diagnosis of influenza from Nasopharyngeal swab specimens and should not be used as a sole basis for treatment. Nasal washings and aspirates are unacceptable for Xpert Xpress SARS-CoV-2/FLU/RSV testing.  Fact Sheet for Patients: EntrepreneurPulse.com.au  Fact Sheet for Healthcare Providers: IncredibleEmployment.be  This test is not yet approved or cleared by the [place] FDA and has been authorized for detection and/or diagnosis of SARS-CoV-2 by FDA under an Emergency Use Authorization (EUA). This EUA will remain in effect (meaning this test can be used) for the duration of the COVID-19 declaration under Section 564(b)(1) of the Act, 21 U.S.C. section 360bbb-3(b)(1), unless the  authorization is terminated or revoked.  Performed at [facility], [address], [place], [place] [address]      Time coordinating discharge: 35 minutes  SIGNED:   Aline August, MD  [facility] 03/07/2021, 12:38 PM

## 2021-03-07 NOTE — Progress Notes (Signed)
[facility] Cardiology    SUBJECTIVE: Patient states he feels much better still has Foley in place renal function is improving no worsening shortness of breath improving energy feels well enough to go home and follow-up as an outpatient no fever chills or sweats   Vitals:   03/06/21 1124 03/06/21 1533 03/06/21 2034 03/07/21 0556  BP: 107/74 103/80 106/84 (!) 118/93  Pulse: 79 66 81 (!) 57  Resp: 18 18 20 20   Temp: 97.6 F (36.4 C) 97.8 F (36.6 C) 98.1 F (36.7 C) 98.1 F (36.7 C)  TempSrc: Oral  Oral Oral  SpO2: 98% 97% 94% 92%  Weight:    80.5 kg  Height:         Intake/Output Summary (Last 24 hours) at 03/07/2021 7867 Last data filed at 03/07/2021 6720 Gross per 24 hour  Intake 1232.97 ml  Output 1415 ml  Net -182.03 ml      PHYSICAL EXAM  General: Well developed, well nourished, in no acute distress HEENT:  Normocephalic and atramatic Neck:  No JVD.  Lungs: Clear bilaterally to auscultation and percussion. Heart: Irregular irregular. Normal S1 and S2 without gallops or murmurs.  Abdomen: Bowel sounds are positive, abdomen soft and non-tender  Msk:  Back normal, normal gait. Normal strength and tone for age. Extremities: No clubbing, cyanosis or edema.   Neuro: Alert and oriented X 3. Psych:  Good affect, responds appropriately   LABS: Basic Metabolic Panel: Recent Labs    03/06/21 0412 03/07/21 0531  NA 137 137  K 4.0 3.9  CL 100 101  CO2 24 26  GLUCOSE 84 91  BUN 51* 49*  CREATININE 2.31* 1.76*  CALCIUM 9.7 9.3   Liver Function Tests: No results for input(s): AST, ALT, ALKPHOS, BILITOT, PROT, ALBUMIN in the last 72 hours. No results for input(s): LIPASE, AMYLASE in the last 72 hours. CBC: Recent Labs    03/05/21 1600 03/06/21 0412 03/07/21 0531  WBC 5.9 6.9 6.9  NEUTROABS 3.7  --   --   HGB 15.2 15.2 15.2  HCT 47.7 45.6 46.8  MCV 91.0 88.5 88.1  PLT 152 149* 146*   Cardiac Enzymes: No results for input(s): CKTOTAL, CKMB, CKMBINDEX, TROPONINI in  the last 72 hours. BNP: Invalid input(s): POCBNP D-Dimer: No results for input(s): DDIMER in the last 72 hours. Hemoglobin A1C: No results for input(s): HGBA1C in the last 72 hours. Fasting Lipid Panel: No results for input(s): CHOL, HDL, LDLCALC, TRIG, CHOLHDL, LDLDIRECT in the last 72 hours. Thyroid Function Tests: No results for input(s): TSH, T4TOTAL, T3FREE, THYROIDAB in the last 72 hours.  Invalid input(s): FREET3 Anemia Panel: No results for input(s): VITAMINB12, FOLATE, FERRITIN, TIBC, IRON, RETICCTPCT in the last 72 hours.  US RENAL  Result Date: 03/06/2021 CLINICAL DATA:  Elevated creatinine EXAM: RENAL / URINARY TRACT ULTRASOUND COMPLETE COMPARISON:  Renal CT 03/05/2021 FINDINGS: Right Kidney: Renal measurements: 10.2 by 5.2 by 5.4 cm = volume: 152 mL. Echogenicity within normal limits. No mass or hydronephrosis visualized. Trace perirenal fluid stranding as shown on recent CT. Left Kidney: Renal measurements: 10.3 by 5.2 by 4.8 cm = volume: 135 mL. Echogenicity within normal limits. No mass or hydronephrosis visualized. Trace perirenal fluid stranding as seen on CT, nonspecific. Bladder: The thick-walled bladder is collapsed around the indwelling catheter. Other: None. IMPRESSION: 1. No hydronephrosis or hydroureter to indicate obstruction contributing to the patient's current renal insufficiency. Renal echogenicity is within normal limits. Low-level perirenal stranding/fluid, as shown on recent CT. 2. Thick-walled urinary  bladder collapsed around the indwelling catheter. Electronically Signed   By: Van Clines M.D.   On: 03/06/2021 18:09   DG Chest Portable 1 View  Result Date: 03/05/2021 CLINICAL DATA:  Flu like symptoms. EXAM: PORTABLE CHEST 1 VIEW COMPARISON:  August 10, 2018 FINDINGS: There is a multi lead AICD. There is no evidence of focal consolidation, pleural effusion or pneumothorax. The cardiac silhouette is markedly enlarged and unchanged in size. Degenerative  changes seen involving the bilateral shoulders and thoracic spine. IMPRESSION: Stable cardiomegaly without acute or active cardiopulmonary disease. Electronically Signed   By: Virgina Norfolk M.D.   On: 03/05/2021 19:51   CT Renal Stone Study  Result Date: 03/05/2021 CLINICAL DATA:  Unable to urinate since yesterday. Poor appetite. Flu-like virus. EXAM: CT ABDOMEN AND PELVIS WITHOUT CONTRAST TECHNIQUE: Multidetector CT imaging of the abdomen and pelvis was performed following the standard protocol without IV contrast. COMPARISON:  None. FINDINGS: Lower chest: Several scattered tiny calcified granulomas at both lung bases. No acute abnormality at the lung bases. Marked cardiomegaly. ICD lead seen terminating in the right ventricular apex and coronary sinus. Hepatobiliary: Normal liver size. Two simple liver cysts, largest 1.5 cm in the inferior right liver. Additional scattered subcentimeter hypodense liver lesions are too small to characterize. Mild diffuse gallbladder wall thickening. No radiopaque cholelithiasis. No significant gallbladder distention. No biliary ductal dilatation. Pancreas: Normal, with no mass or duct dilation. Spleen: Normal size. No mass. Adrenals/Urinary Tract: Normal adrenals. No renal stones. No hydronephrosis. No contour deforming renal masses. Normal caliber ureters. No ureteral stones. Bladder completely decompressed by indwelling Foley catheter. Expected gas in the bladder due to instrumentation. Diffuse bladder wall thickening with scattered bladder diverticula, largest 3 cm in the posterior left bladder wall. No bladder stones. Stomach/Bowel: Normal non-distended stomach. Normal caliber small bowel with no small bowel wall thickening. Appendix not discretely visualized. No pericecal inflammatory changes. Moderate sigmoid diverticulosis with no large bowel wall thickening or significant pericolonic fat stranding. Vascular/Lymphatic: Atherosclerotic nonaneurysmal abdominal aorta. No  pathologically enlarged lymph nodes in the abdomen or pelvis. Reproductive: Moderate prostatomegaly. Other: No pneumoperitoneum, ascites or focal fluid collection. Musculoskeletal: No aggressive appearing focal osseous lesions. Marked thoracolumbar spondylosis. IMPRESSION: 1. Bladder completely decompressed by indwelling Foley catheter. 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 2. No urolithiasis. No hydronephrosis. 3. Nonspecific mild diffuse gallbladder wall thickening with no radiopaque cholelithiasis, potentially due to noninflammatory edema. 4. Marked cardiomegaly. 5. Moderate sigmoid diverticulosis. 6. Aortic Atherosclerosis (ICD10-I70.0). Electronically Signed   By: Ilona Sorrel M.D.   On: 03/05/2021 18:46     Echo, depressed left ventricular function globally EF less than 25%  TELEMETRY: Atrial fibrillation rate control:  ASSESSMENT AND PLAN:  Active Problems:   Acute urinary retention   Benign prostatic hyperplasia with lower urinary tract symptoms   Atrial fibrillation (HCC)   Chronic systolic CHF (congestive heart failure), NYHA class 2 (HCC)   Essential hypertension   ICD (implantable cardioverter-defibrillator) in place   NICM (nonischemic cardiomyopathy) ([place])   AKI (acute kidney injury) ([place])   Chronic anticoagulation   Acute renal failure (ARF) ([place])    Plan Continue current therapy obstruction appears to be resolved and renal function improving consider discharge and follow-up as an outpatient Agree with nephrology and urology input for acute renal injury related to obstruction Continue heart failure therapy hopefully with Entresto we will hold metolazone as per nephrology and resume torsemide to help with  heart failure Patient to continue Coreg for heart failure blood pressure management Renal function to be followed by nephrology continue Foley catheter to  help with obstruction and follow-up with urology Continue Coumadin therapy for paroxysmal atrial fibrillation anticoagulation management Coreg for rate Follow-up with cardiology as an outpatient with Dr. Pressures    Yolonda Kida, MD 03/07/2021 8:32 AM

## 2021-03-07 NOTE — Care Management (Signed)
MD ordered home health for foley catheter care. Foley care is not a skilled need that is skilled need covered by home health.  MD and bedside RN notified.  Bedside RN educated patient and family on foley care prior to discharge

## 2021-03-07 NOTE — Consult Note (Signed)
[place] for warfarin Indication: atrial fibrillation  Allergies  Allergen Reactions  . Oxycodone     vomiting    Vital Signs: Temp: 98.1 F (36.7 C) (05/04 0556) Temp Source: Oral (05/04 0556) BP: 118/93 (05/04 0556) Pulse Rate: 57 (05/04 0556)  Labs: Recent Labs    03/05/21 1600 03/05/21 2153 03/06/21 0412 03/07/21 0531  HGB 15.2  --  15.2 15.2  HCT 47.7  --  45.6 46.8  PLT 152  --  149* 146*  LABPROT  --  31.3* 31.4* 30.3*  INR  --  3.0* 3.0* 2.9*  CREATININE 2.56*  --  2.31* 1.76*    Estimated Creatinine Clearance: 35.2 mL/min (A) (by C-G formula based on SCr of 1.76 mg/dL (H)).   Medical History: Past Medical History:  Diagnosis Date  . Anxiety   . CHF (congestive heart failure) ([place])   . Claustrophobia   . Colitis, ischemic ([place]) 2010   developed DVT  . DVT (deep venous thrombosis) ([place]) 2010   stomach and leg  . Dysrhythmia    A-Fib  . Hypertension     Medications:  Warfarin PTA regimen (1 mg tablets at home) -- confirmed with patient 03/05/21  3 mg : Wed, Sat, Sun  2 mg: Mon, Tues, Thurs, Friday  Total weekly dose = 17 mg  Assessment: 80 y.o. male with medical history significant for A. fib on Coumadin. Per patient has been stable long-term on warfarin regimen as above.   Pharmacy has been consulted for inpatient management of warfarin  Date INR Warfarin Dose 5/1 -- 3 mg (per patient) 5/2 3.0 2 mg  5/3 3.0 2 mg  5/4 2.9 3 mg   Goal of Therapy:  INR 2-3 Monitor platelets by anticoagulation protocol: Yes   Plan:   INR therapeutic. Continue home regimen and give 3 mg dose x 1 tonight  INR and CBC daily   Lu Duffel, PharmD, BCPS Clinical Pharmacist  03/07/2021,8:10 AM

## 2021-03-19 ENCOUNTER — Ambulatory Visit (INDEPENDENT_AMBULATORY_CARE_PROVIDER_SITE_OTHER): Payer: HMO | Admitting: Urology

## 2021-03-19 ENCOUNTER — Encounter: Payer: Self-pay | Admitting: Urology

## 2021-03-19 ENCOUNTER — Other Ambulatory Visit: Payer: Self-pay

## 2021-03-19 VITALS — BP 122/89 | HR 94 | Ht 67.0 in | Wt 177.0 lb

## 2021-03-19 DIAGNOSIS — R338 Other retention of urine: Secondary | ICD-10-CM | POA: Diagnosis not present

## 2021-03-19 DIAGNOSIS — R972 Elevated prostate specific antigen [PSA]: Secondary | ICD-10-CM

## 2021-03-19 DIAGNOSIS — N401 Enlarged prostate with lower urinary tract symptoms: Secondary | ICD-10-CM | POA: Diagnosis not present

## 2021-03-19 DIAGNOSIS — R310 Gross hematuria: Secondary | ICD-10-CM | POA: Diagnosis not present

## 2021-03-19 LAB — BLADDER SCAN AMB NON-IMAGING: Scan Result: >391

## 2021-03-19 MED ORDER — TAMSULOSIN HCL 0.4 MG PO CAPS: 0.4000 mg | ORAL_CAPSULE | Freq: Every day | ORAL | 3 refills | Status: DC

## 2021-03-19 NOTE — Progress Notes (Signed)
03/19/2021 8:44 AM   [name] 1941-05-26 [id number]  Referring provider: Dion Body, MD [facility] [place],  [place] [address]  Chief Complaint  Patient presents with  . Urinary Retention    HPI: 80 y.o. male presents for recent hospital follow-up.   Admitted to hospital service at [facility] 03/05/21 for acute kidney injury and urinary retention  Admission creatinine 2.56 (baseline 1.09  Catheter placed with 515 mL of urine obtained  Refer to my consult note for details  Follow-up creatinine with PCP 03/13/2021 was 1.0  Remains on tamsulosin   PMH: Past Medical History:  Diagnosis Date  . Anxiety   . CHF (congestive heart failure) ([place])   . Claustrophobia   . Colitis, ischemic ([place]) 2010   developed DVT  . DVT (deep venous thrombosis) ([place]) 2010   stomach and leg  . Dysrhythmia    A-Fib  . Hypertension     Surgical History: Past Surgical History:  Procedure Laterality Date  . HERNIA REPAIR Right 2007  . IMPLANTABLE CARDIOVERTER DEFIBRILLATOR (ICD) GENERATOR CHANGE Left 02/21/2016   Procedure: ICD GENERATOR CHANGE;  Surgeon: Isaias Cowman, MD;  Location: [facility] ORS;  Service: Cardiovascular;  Laterality: Left;  . PACEMAKER INSERTION    . PITUITARY SURGERY  2002   [facility] at [facility] Medications:  Allergies as of 03/19/2021      Reactions   Escitalopram Oxalate Nausea Only   Oxycodone    vomiting      Medication List       Accurate as of Mar 19, 2021  8:44 AM. If you have any questions, ask your nurse or doctor.        albuterol 108 (90 Base) MCG/ACT inhaler Commonly known as: VENTOLIN HFA SMARTSIG:1-2 Inhalation Via Inhaler Every 4 Hours PRN   ALPRAZolam 0.5 MG tablet Commonly known as: XANAX Take 0.5 mg by mouth daily as needed for anxiety.   azelastine 0.1 % nasal spray Commonly known as: ASTELIN Place into the nose.   carvedilol 3.125 MG tablet Commonly known as: COREG Take 3.125 mg by mouth  2 (two) times daily.   cetirizine 10 MG tablet Commonly known as: ZYRTEC Take 1 tablet by mouth daily.   Cialis 5 MG tablet Generic drug: tadalafil Take 5 mg by mouth daily.   fluticasone 50 MCG/ACT nasal spray Commonly known as: FLONASE Place 2 sprays into both nostrils daily as needed for allergies or rhinitis.   potassium chloride SA 20 MEQ tablet Commonly known as: KLOR-CON Take 20 mEq by mouth 2 (two) times daily.   tamsulosin 0.4 MG Caps capsule Commonly known as: FLOMAX Take 1 capsule (0.4 mg total) by mouth daily.   torsemide 20 MG tablet Commonly known as: DEMADEX Take 20 mg by mouth 2 (two) times daily.   vitamin B-12 1000 MCG tablet Commonly known as: CYANOCOBALAMIN Take 1,000 mcg by mouth daily.   warfarin 1 MG tablet Commonly known as: COUMADIN Take 3-4 mg by mouth See admin instructions. Take 2 MG by mouth every Monday, Tuesday, 3 mg Wednesday, 2 mg Thursday and Friday evening and 3 MG by mouth every Saturday and Sunday evening       Allergies:  Allergies  Allergen Reactions  . Escitalopram Oxalate Nausea Only  . Oxycodone     vomiting    Family History: History reviewed. No pertinent family history.  Social History:  reports that he quit smoking about 47 years ago. He has never used  smokeless tobacco. He reports that he does not drink alcohol and does not use drugs.   Physical Exam: BP 122/89   Pulse 94   Ht 5\' 7"  (1.702 m)   Wt 177 lb (80.3 kg)   BMI 27.72 kg/m   Constitutional:  Alert and oriented, No acute distress. HEENT:  AT, moist mucus membranes.  Trachea midline, no masses. Cardiovascular: No clubbing, cyanosis, or edema. Respiratory: Normal respiratory effort, no increased work of breathing. Skin: No rashes, bruises or suspicious lesions. Neurologic: Grossly intact, no focal deficits, moving all 4 extremities. Psychiatric: Normal mood and affect.   Pertinent Imaging: Images were personally reviewed and interpreted  US  RENAL  Narrative CLINICAL DATA:  Elevated creatinine  EXAM: RENAL / URINARY TRACT ULTRASOUND COMPLETE  COMPARISON:  Renal CT 03/05/2021  FINDINGS: Right Kidney:  Renal measurements: 10.2 by 5.2 by 5.4 cm = volume: 152 mL. Echogenicity within normal limits. No mass or hydronephrosis visualized. Trace perirenal fluid stranding as shown on recent CT.  Left Kidney:  Renal measurements: 10.3 by 5.2 by 4.8 cm = volume: 135 mL. Echogenicity within normal limits. No mass or hydronephrosis visualized. Trace perirenal fluid stranding as seen on CT, nonspecific.  Bladder:  The thick-walled bladder is collapsed around the indwelling catheter.  Other:  None.  IMPRESSION: 1. No hydronephrosis or hydroureter to indicate obstruction contributing to the patient's current renal insufficiency. Renal echogenicity is within normal limits. Low-level perirenal stranding/fluid, as shown on recent CT. 2. Thick-walled urinary bladder collapsed around the indwelling catheter.   Electronically Signed By: Van Clines M.D. On: 03/06/2021 18:09   CT Renal Stone Study  Narrative CLINICAL DATA:  Unable to urinate since yesterday. Poor appetite. Flu-like virus.  EXAM: CT ABDOMEN AND PELVIS WITHOUT CONTRAST  TECHNIQUE: Multidetector CT imaging of the abdomen and pelvis was performed following the standard protocol without IV contrast.  COMPARISON:  None.  FINDINGS: Lower chest: Several scattered tiny calcified granulomas at both lung bases. No acute abnormality at the lung bases. Marked cardiomegaly. ICD lead seen terminating in the right ventricular apex and coronary sinus.  Hepatobiliary: Normal liver size. Two simple liver cysts, largest 1.5 cm in the inferior right liver. Additional scattered subcentimeter hypodense liver lesions are too small to characterize. Mild diffuse gallbladder wall thickening. No radiopaque cholelithiasis. No significant gallbladder distention.  No biliary ductal dilatation.  Pancreas: Normal, with no mass or duct dilation.  Spleen: Normal size. No mass.  Adrenals/Urinary Tract: Normal adrenals. No renal stones. No hydronephrosis. No contour deforming renal masses. Normal caliber ureters. No ureteral stones. Bladder completely decompressed by indwelling Foley catheter. Expected gas in the bladder due to instrumentation. Diffuse bladder wall thickening with scattered bladder diverticula, largest 3 cm in the posterior left bladder wall. No bladder stones.  Stomach/Bowel: Normal non-distended stomach. Normal caliber small bowel with no small bowel wall thickening. Appendix not discretely visualized. No pericecal inflammatory changes. Moderate sigmoid diverticulosis with no large bowel wall thickening or significant pericolonic fat stranding.  Vascular/Lymphatic: Atherosclerotic nonaneurysmal abdominal aorta. No pathologically enlarged lymph nodes in the abdomen or pelvis.  Reproductive: Moderate prostatomegaly.  Other: No pneumoperitoneum, ascites or focal fluid collection.  Musculoskeletal: No aggressive appearing focal osseous lesions. Marked thoracolumbar spondylosis.  IMPRESSION: 1. Bladder completely decompressed by indwelling Foley catheter. 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 2. No urolithiasis. No hydronephrosis. 3. Nonspecific mild diffuse gallbladder wall thickening with no radiopaque cholelithiasis, potentially due to  noninflammatory edema. 4. Marked cardiomegaly. 5. Moderate sigmoid diverticulosis. 6. Aortic Atherosclerosis (ICD10-I70.0).   Electronically Signed By: Ilona Sorrel M.D. On: 03/05/2021 18:46   Assessment & Plan:    1.  Acute urinary retention  Foley catheter removed for voiding trial  Follow-up appointment this afternoon bladder scan PVR  If no significant PVR 1  month follow-up for repeat bladder scan and DRE   2.  Elevated PSA  Prior biopsy ~ 15 years ago  Last PSA 2017 was 11.8; he states there was no further evaluation  DRE and repeat PSA on follow-up   Abbie Sons, MD  [facility] [address], [place], [place] [address] [phone]

## 2021-03-19 NOTE — Addendum Note (Signed)
Addended by: Kyra Manges on: 03/19/2021 03:19 PM   Modules accepted: Orders

## 2021-03-19 NOTE — Progress Notes (Signed)
Catheter Removal  Patient is present today for a catheter removal.  71ml of water was drained from the balloon. A 16FR foley cath was removed from the bladder no complications were noted . Patient tolerated well.  Performed by: Elberta Leatherwood  Follow up/ Additional notes: PM PVR    PVR was performed with a residual of >391

## 2021-03-19 NOTE — Progress Notes (Signed)
Simple Catheter Placement  Due to urinary retention patient is present today for a foley cath placement.  Patient was cleaned and prepped in a sterile fashion with betadine. A 16 FR foley catheter was inserted, urine return was noted  432ml, urine was dark red in color.  The balloon was filled with 10cc of sterile water.  A leg bag was attached for drainage. Patient was also given a night bag to take home and was given instruction on how to change from one bag to another.  Patient was given instruction on proper catheter care.  Patient tolerated well, no complications were noted   Performed by: Elberta Leatherwood, CMA  Additional notes/ Follow up: 1 week TOV

## 2021-03-19 NOTE — Addendum Note (Signed)
Addended by: Kyra Manges on: 03/19/2021 03:04 PM   Modules accepted: Orders

## 2021-03-23 ENCOUNTER — Telehealth: Payer: Self-pay | Admitting: *Deleted

## 2021-03-23 LAB — URINALYSIS, COMPLETE

## 2021-03-23 LAB — CULTURE, URINE COMPREHENSIVE

## 2021-03-23 LAB — MICROSCOPIC EXAMINATION
RBC, Urine: >30 /hpf — ABNORMAL HIGH (ref 0–2)
WBC, UA: >30 /hpf — ABNORMAL HIGH (ref 0–5)

## 2021-03-23 NOTE — Telephone Encounter (Signed)
-----   Message from Abbie Sons, MD sent at 03/23/2021  2:15 PM EDT ----- Patient was asymptomatic and would not treat unless he develops symptoms

## 2021-03-23 NOTE — Telephone Encounter (Signed)
Notified patient as instructed, He is not having any problems

## 2021-03-26 NOTE — Progress Notes (Signed)
03/27/2021 2:59 PM   Adrian Chavez 1941-09-01 [id number]  Referring provider: Dion Body, MD [facility] [place],  [place] [address]  Chief Complaint  Patient presents with  . Urinary Retention   Urological history: 1. Urinary retention -presented to ED on 03/06/2021 -Foley placed for 2 L return -failed TOV on 03/19/2021  2. BPH  -prostate volume 111 cc on recent CT  3. Elevated PSA -PSA 11.8 in 2017 -per patient prostate biopsy with inconclusive results ~ 15 years ago  4. High risk hematuria -former smoker -Dr. Jacqlyn Larsen in 2014 for gross hematuria and CT urogram/cystoscopy recommended however this apparently was never scheduled or performed   HPI: Adrian Chavez is a 80 y.o. male who presents today for a TOV.  No hydro on RUS.   Catheter Removal  Patient is present today for a catheter removal.  9 ml of water was drained from the balloon. A 16 FR foley cath was removed from the bladder no complications were noted . Patient tolerated well.   PMH: Past Medical History:  Diagnosis Date  . Anxiety   . CHF (congestive heart failure) ([place])   . Claustrophobia   . Colitis, ischemic ([place]) 2010   developed DVT  . DVT (deep venous thrombosis) ([place]) 2010   stomach and leg  . Dysrhythmia    A-Fib  . Hypertension     Surgical History: Past Surgical History:  Procedure Laterality Date  . HERNIA REPAIR Right 2007  . IMPLANTABLE CARDIOVERTER DEFIBRILLATOR (ICD) GENERATOR CHANGE Left 02/21/2016   Procedure: ICD GENERATOR CHANGE;  Surgeon: Isaias Cowman, MD;  Location: [facility] ORS;  Service: Cardiovascular;  Laterality: Left;  . PACEMAKER INSERTION    . PITUITARY SURGERY  2002   Done at [facility] Medications:  Allergies as of 03/27/2021      Reactions   Escitalopram Oxalate Nausea Only   Oxycodone    vomiting      Medication List       Accurate as of Mar 27, 2021  2:59 PM. If you have any questions, ask your nurse or  doctor.        albuterol 108 (90 Base) MCG/ACT inhaler Commonly known as: VENTOLIN HFA SMARTSIG:1-2 Inhalation Via Inhaler Every 4 Hours PRN   ALPRAZolam 0.5 MG tablet Commonly known as: XANAX Take 0.5 mg by mouth daily as needed for anxiety.   azelastine 0.1 % nasal spray Commonly known as: ASTELIN Place into the nose.   carvedilol 3.125 MG tablet Commonly known as: COREG Take 3.125 mg by mouth 2 (two) times daily.   cetirizine 10 MG tablet Commonly known as: ZYRTEC Take 1 tablet by mouth daily.   Cialis 5 MG tablet Generic drug: tadalafil Take 5 mg by mouth daily.   Entresto 49-51 MG Generic drug: sacubitril-valsartan Take 1 tablet by mouth 2 (two) times daily.   finasteride 5 MG tablet Commonly known as: Proscar Take 1 tablet (5 mg total) by mouth daily. Started by: Zara Council, PA-C   fluticasone 50 MCG/ACT nasal spray Commonly known as: FLONASE Place 2 sprays into both nostrils daily as needed for allergies or rhinitis.   potassium chloride SA 20 MEQ tablet Commonly known as: KLOR-CON Take 20 mEq by mouth 2 (two) times daily.   tamsulosin 0.4 MG Caps capsule Commonly known as: FLOMAX Take 1 capsule (0.4 mg total) by mouth daily.   torsemide 20 MG tablet Commonly known as: DEMADEX Take 20 mg by mouth  2 (two) times daily.   vitamin B-12 1000 MCG tablet Commonly known as: CYANOCOBALAMIN Take 1,000 mcg by mouth daily.   warfarin 1 MG tablet Commonly known as: COUMADIN Take 3-4 mg by mouth See admin instructions. Take 2 MG by mouth every Monday, Tuesday, 3 mg Wednesday, 2 mg Thursday and Friday evening and 3 MG by mouth every Saturday and Sunday evening       Allergies:  Allergies  Allergen Reactions  . Escitalopram Oxalate Nausea Only  . Oxycodone     vomiting    Family History: History reviewed. No pertinent family history.  Social History:  reports that he quit smoking about 47 years ago. He has never used smokeless tobacco. He reports  that he does not drink alcohol and does not use drugs.  ROS: Pertinent ROS in HPI  Physical Exam: There were no vitals taken for this visit.  Constitutional:  Well nourished. Alert and oriented, No acute distress. HEENT: [place] AT, mask in place.  Trachea midline Cardiovascular: No clubbing, cyanosis, or edema. Respiratory: Normal respiratory effort, no increased work of breathing. Neurologic: Grossly intact, no focal deficits, moving all 4 extremities. Psychiatric: Normal mood and affect.  Laboratory Data: Lab Results  Component Value Date   WBC 6.9 03/07/2021   HGB 15.2 03/07/2021   HCT 46.8 03/07/2021   MCV 88.1 03/07/2021   PLT 146 (L) 03/07/2021    Lab Results  Component Value Date   CREATININE 1.76 (H) 03/07/2021   Urinalysis    Component Value Date/Time   COLORURINE YELLOW (A) 03/05/2021 1349   APPEARANCEUR Cloudy (A) 03/19/2021 1529   LABSPEC 1.014 03/05/2021 1349   LABSPEC 1.014 04/07/2012 1629   PHURINE 5.0 03/05/2021 1349   GLUCOSEU CANCELED 03/19/2021 1529   GLUCOSEU Negative 04/07/2012 1629   HGBUR SMALL (A) 03/05/2021 1349   BILIRUBINUR NEGATIVE 03/05/2021 1349   BILIRUBINUR Negative 04/07/2012 [place] 03/05/2021 1349   PROTEINUR CANCELED 03/19/2021 1529   PROTEINUR 30 (A) 03/05/2021 1349   NITRITE NEGATIVE 03/05/2021 1349   LEUKOCYTESUR NEGATIVE 03/05/2021 1349   LEUKOCYTESUR Negative 04/07/2012 1629  I have reviewed the labs.   Pertinent Imaging: CLINICAL DATA:  Elevated creatinine  EXAM: RENAL / URINARY TRACT ULTRASOUND COMPLETE  COMPARISON:  Renal CT 03/05/2021  FINDINGS: Right Kidney:  Renal measurements: 10.2 by 5.2 by 5.4 cm = volume: 152 mL. Echogenicity within normal limits. No mass or hydronephrosis visualized. Trace perirenal fluid stranding as shown on recent CT.  Left Kidney:  Renal measurements: 10.3 by 5.2 by 4.8 cm = volume: 135 mL. Echogenicity within normal limits. No mass or  hydronephrosis visualized. Trace perirenal fluid stranding as seen on CT, nonspecific.  Bladder:  The thick-walled bladder is collapsed around the indwelling catheter.  Other:  None.  IMPRESSION: 1. No hydronephrosis or hydroureter to indicate obstruction contributing to the patient's current renal insufficiency. Renal echogenicity is within normal limits. Low-level perirenal stranding/fluid, as shown on recent CT. 2. Thick-walled urinary bladder collapsed around the indwelling catheter.   Electronically Signed   By: Van Clines M.D.   On: 03/06/2021 18:09  Results for RASHAN, ROUNSAVILLE (MRN [id number]) as of 03/27/2021 15:52  Ref. Range 03/27/2021 15:02  Scan Result Unknown >678   I have independently reviewed the films.    Simple Catheter Placement Patient attempted as self-catheterization, but unfortunately he was met with resistance and gross hematuria.  I then had to place a urinary catheter.  Patient was cleaned and prepped in a  sterile fashion with betadine. A 16 FR foley catheter was inserted, urine return was noted  800 ml, urine was dark red in color.  The balloon was filled with 10cc of sterile water.  A leg bag was attached for drainage. Patient was also given a night bag to take home and was given instruction on how to change from one bag to another.  Patient was given instruction on proper catheter care.  Patient tolerated well, no complications were noted   Assessment & Plan:    1. Acute urinary retention -Foley placed this afternoon -Patient is wife are wanting to have further evaluation as to why he cannot urinate -I will get him scheduled for cystoscopy -I have explained to the patient that they will  be scheduled for a cystoscopy in our office to evaluate their bladder.  The cystoscopy consists of passing a tube with a lens up through their urethra and into their urinary bladder.   We will inject the urethra with a lidocaine gel prior to  introducing the cystoscope to help with any discomfort during the procedure.   After the procedure, they might experience blood in the urine and discomfort with urination.  This will abate after the first few voids.  I have  encouraged the patient to increase water intake  during this time.  Patient denies any allergies to lidocaine.    2. BPH -continue tamsulosin 0.4 mg daily -start finasteride 5 mg daily  3. Elevated PSA -will need PSA and DRE at some point  4. Gross hematuria -Likely secondary to prostate being traumatized by the attempts at self-catheterization as after I was able to place the catheter the urine was yellow clear.  Explained this to the patient and his wife and that as long as the catheter is draining and he does not see significant blood clots or bright red urine we expect this.  Return for return for cysto to evaluate for BOO/Gross hematuria .  These notes generated with voice recognition software. I apologize for typographical errors.  Zara Council, PA-C  [facility] [address]  [place], [place] [address] [phone]

## 2021-03-27 ENCOUNTER — Ambulatory Visit: Payer: HMO | Admitting: Urology

## 2021-03-27 ENCOUNTER — Other Ambulatory Visit: Payer: Self-pay

## 2021-03-27 ENCOUNTER — Encounter: Payer: Self-pay | Admitting: Urology

## 2021-03-27 ENCOUNTER — Ambulatory Visit (INDEPENDENT_AMBULATORY_CARE_PROVIDER_SITE_OTHER): Payer: HMO | Admitting: Urology

## 2021-03-27 DIAGNOSIS — N138 Other obstructive and reflux uropathy: Secondary | ICD-10-CM

## 2021-03-27 DIAGNOSIS — R972 Elevated prostate specific antigen [PSA]: Secondary | ICD-10-CM | POA: Diagnosis not present

## 2021-03-27 DIAGNOSIS — N401 Enlarged prostate with lower urinary tract symptoms: Secondary | ICD-10-CM

## 2021-03-27 DIAGNOSIS — R338 Other retention of urine: Secondary | ICD-10-CM

## 2021-03-27 LAB — BLADDER SCAN AMB NON-IMAGING: Scan Result: >678

## 2021-03-27 MED ORDER — FINASTERIDE 5 MG PO TABS: 5.0000 mg | ORAL_TABLET | Freq: Every day | ORAL | 3 refills | Status: DC

## 2021-04-03 ENCOUNTER — Telehealth: Payer: Self-pay | Admitting: Urology

## 2021-04-03 NOTE — Telephone Encounter (Signed)
Spoke with patient's wife, she was notified patient does not need to stop blood thinners for in office cysto. She verbalized understanding

## 2021-04-03 NOTE — Telephone Encounter (Signed)
Wife called to let us know that they had problems w/patient's I & R and wonders if he should still do treatment on Friday 6/3

## 2021-04-04 ENCOUNTER — Telehealth: Payer: Self-pay | Admitting: Urology

## 2021-04-04 NOTE — Telephone Encounter (Signed)
Patient called the office today and left a voice mail message that he has a catheter and is experiencing some burning during urination.   Per Judson Roch, I advised him that he should keep his appointment on 6/3 with Dr. Bernardo Heater.

## 2021-04-06 ENCOUNTER — Encounter: Payer: Self-pay | Admitting: Urology

## 2021-04-06 ENCOUNTER — Ambulatory Visit (INDEPENDENT_AMBULATORY_CARE_PROVIDER_SITE_OTHER): Payer: HMO | Admitting: Urology

## 2021-04-06 ENCOUNTER — Other Ambulatory Visit: Payer: Self-pay

## 2021-04-06 VITALS — BP 98/62 | HR 71 | Ht 69.0 in | Wt 177.0 lb

## 2021-04-06 DIAGNOSIS — R338 Other retention of urine: Secondary | ICD-10-CM

## 2021-04-06 MED ORDER — LEVOFLOXACIN 500 MG PO TABS
500.0000 mg | ORAL_TABLET | Freq: Once | ORAL | Status: AC
  Administered 2021-04-06: 500 mg via ORAL

## 2021-04-06 NOTE — Progress Notes (Signed)
Cath Change/ Replacement  Patient is present today for a catheter change due to urinary retention.  57ml of water was removed from the balloon, a 14FR foley cath was removed with out difficulty.  Patient was cleaned and prepped in a sterile fashion with betadine. A 14 FR foley cath was replaced into the bladder no complications were noted Urine return was noted 182ml and urine was yellow in color. The balloon was filled with 47ml of sterile water. A leg bag was attached for drainage.  A night bag was also given to the patient and patient was given instruction on how to change from one bag to another. Patient was given proper instruction on catheter care.    Performed by: Gaspar Cola CMA

## 2021-04-06 NOTE — Progress Notes (Signed)
   04/06/21  CC:  Chief Complaint  Patient presents with  . Cysto    Urological history: 1. Urinary retention -presented to ED on 03/06/2021 -Foley placed for 2 L return -failed TOV on 03/19/2021 and 03/27/2021  2. BPH  -prostate volume 111 cc on recent CT  3. Elevated PSA -PSA 11.8 in 2017 -per patient prostate biopsy with inconclusive results ~ 15 years ago  4. High risk hematuria -former smoker -Dr. Jacqlyn Larsen in 2014 for gross hematuria and CT urogram/cystoscopy recommended however this apparently was never scheduled or performed   HPI:  Blood pressure 98/62, pulse 71, height 5\' 9"  (1.753 m), weight 177 lb (80.3 kg). NED. A&Ox3.   No respiratory distress   Abd soft, NT, ND Normal phallus with bilateral descended testicles  Cystoscopy Procedure Note  Patient identification was confirmed, informed consent was obtained, and patient was prepped using Betadine solution.  Lidocaine jelly was administered per urethral meatus.     Pre-Procedure: - Inspection reveals a normal caliber urethral meatus.  Procedure: The flexible cystoscope was introduced without difficulty - No urethral strictures/lesions are present. - Prominent lateral lobe enlargement prostate  - Moderate elevation bladder neck - Bilateral ureteral orifices not identified - Bladder mucosa  reveals inflammatory changes bladder base most likely secondary to indwelling Foley.  Cannot rule out possibility of a papillary tumor - No bladder stones -Moderate to severe trabeculation-scattered cellules diverticula  Retroflexion shows no intravesical median lobe   Post-Procedure: - Patient tolerated the procedure well  Assessment/ Plan:  Marked prostatic enlargement.  Discussed with patient and his wife potential etiologies of urinary retention of outlet obstruction due to prostate enlargement, poorly functioning bladder muscle or combination of both.  We discussed that outlet surgery would not be effective  if he had significant bladder muscle weakness and I recommended scheduling a urodynamic study for further evaluation   Inflammatory tissue bladder base which may be secondary to indwelling Foley however the possibility of a papillary tumor cannot be excluded.  Urine was sent for cytology.  He may need cystoscopy under anesthesia and biopsy/TURBT  PSA performed by his PCP during recent hospitalization was 15.5 however he had a catheter placed at that time.    Abbie Sons, MD

## 2021-04-09 LAB — CYTOLOGY - NON PAP

## 2021-04-10 ENCOUNTER — Ambulatory Visit (INDEPENDENT_AMBULATORY_CARE_PROVIDER_SITE_OTHER): Payer: HMO | Admitting: Physician Assistant

## 2021-04-10 ENCOUNTER — Other Ambulatory Visit: Payer: Self-pay

## 2021-04-10 ENCOUNTER — Ambulatory Visit: Payer: Self-pay | Admitting: Physician Assistant

## 2021-04-10 DIAGNOSIS — R339 Retention of urine, unspecified: Secondary | ICD-10-CM | POA: Diagnosis not present

## 2021-04-10 NOTE — Progress Notes (Signed)
Simple Catheter Placement  Due to urinary retention patient is present today for a foley cath placement.  Patient was cleaned and prepped in a sterile fashion with betadine. A 16 FR foley coude catheter was inserted, urine return was noted  438ml, urine was yellow in color.  The balloon was filled with 10cc of sterile water.  A leg bag was attached for drainage. Patient was also given a night bag to take home and was given instruction on how to change from one bag to another.  Patient was given instruction on proper catheter care.  Patient tolerated well, no complications were noted   Performed by: Bradly Bienenstock, CMA

## 2021-04-10 NOTE — Progress Notes (Signed)
Catheter Removal  Patient is present today for a catheter removal.  24ml of water was drained from the balloon. A 14FR foley cath was removed from the bladder no complications were noted . Patient tolerated well.  Performed by: Debroah Loop, PA-C   Follow up/ Additional notes: Push fluids and RTC this afternoon for PVR.

## 2021-04-10 NOTE — Progress Notes (Signed)
04/10/2021 10:26 AM   Adrian Chavez 20-Jun-1941 [id number]  CC: Chief Complaint  Patient presents with   Urinary Retention   HPI: Adrian Chavez is a 80 y.o. male with PMH BPH with urinary retention who has failed 2 outpatient voiding trials, elevated PSA with inconclusive biopsy about 15 years ago, and high risk hematuria who never underwent CT urogram who presents today for repeat voiding trial.  He underwent cystoscopy with Dr. Bernardo Heater 4 days ago, which revealed prominent lateral lobe enlargement, moderate elevation of the bladder neck, inflammatory changes of the bladder base, and moderate to severe trabeculation with scattered cellules and diverticula.  Urine cytology benign and he was referred for urodynamics prior to consideration of bladder outlet procedures.  Today he reports minimal bother from his catheter, however he is anxious about the possibility of long-term catheterization.  He has not yet been contacted to schedule urodynamics.  Patient returns to clinic for repeat PVR.  PVR 423mL.  He reports some mild dysuria today.  PMH: Past Medical History:  Diagnosis Date   Anxiety    CHF (congestive heart failure) ([place])    Claustrophobia    Colitis, ischemic ([place]) 2010   developed DVT   DVT (deep venous thrombosis) ([place]) 2010   stomach and leg   Dysrhythmia    A-Fib   Hypertension     Surgical History: Past Surgical History:  Procedure Laterality Date   HERNIA REPAIR Right 2007   IMPLANTABLE CARDIOVERTER DEFIBRILLATOR (ICD) GENERATOR CHANGE Left 02/21/2016   Procedure: ICD GENERATOR CHANGE;  Surgeon: Isaias Cowman, MD;  Location: [facility] ORS;  Service: Cardiovascular;  Laterality: Left;   PACEMAKER INSERTION     PITUITARY SURGERY  2002   Done at [facility] Medications:  Allergies as of 04/10/2021       Reactions   Escitalopram Oxalate Nausea Only   Oxycodone    vomiting        Medication List        Accurate as of April 10, 2021 10:26 AM. If you have  any questions, ask your nurse or doctor.          albuterol 108 (90 Base) MCG/ACT inhaler Commonly known as: VENTOLIN HFA SMARTSIG:1-2 Inhalation Via Inhaler Every 4 Hours PRN   ALPRAZolam 0.5 MG tablet Commonly known as: XANAX Take 0.5 mg by mouth daily as needed for anxiety.   azelastine 0.1 % nasal spray Commonly known as: ASTELIN Place into the nose.   carvedilol 3.125 MG tablet Commonly known as: COREG Take 3.125 mg by mouth 2 (two) times daily.   cetirizine 10 MG tablet Commonly known as: ZYRTEC Take 1 tablet by mouth daily.   Cialis 5 MG tablet Generic drug: tadalafil Take 5 mg by mouth daily.   Entresto 49-51 MG Generic drug: sacubitril-valsartan Take 1 tablet by mouth 2 (two) times daily.   finasteride 5 MG tablet Commonly known as: Proscar Take 1 tablet (5 mg total) by mouth daily.   fluticasone 50 MCG/ACT nasal spray Commonly known as: FLONASE Place 2 sprays into both nostrils daily as needed for allergies or rhinitis.   potassium chloride SA 20 MEQ tablet Commonly known as: KLOR-CON Take 20 mEq by mouth 2 (two) times daily.   tamsulosin 0.4 MG Caps capsule Commonly known as: FLOMAX Take 1 capsule (0.4 mg total) by mouth daily.   torsemide 20 MG tablet Commonly known as: DEMADEX Take 20 mg by mouth 2 (two) times daily.   vitamin B-12  1000 MCG tablet Commonly known as: CYANOCOBALAMIN Take 1,000 mcg by mouth daily.   warfarin 1 MG tablet Commonly known as: COUMADIN Take 3-4 mg by mouth See admin instructions. Take 2 MG by mouth every Monday, Tuesday, 3 mg Wednesday, 2 mg Thursday and Friday evening and 3 MG by mouth every Saturday and Sunday evening        Allergies:  Allergies  Allergen Reactions   Escitalopram Oxalate Nausea Only   Oxycodone     vomiting    Family History: No family history on file.  Social History:   reports that he quit smoking about 47 years ago. He has never used smokeless tobacco. He reports that he does  not drink alcohol and does not use drugs.  Physical Exam: There were no vitals taken for this visit.  Constitutional:  Alert and oriented, no acute distress, nontoxic appearing HEENT: [place], AT Cardiovascular: No clubbing, cyanosis, or edema Respiratory: Normal respiratory effort, no increased work of breathing Skin: No rashes, bruises or suspicious lesions Neurologic: Grossly intact, no focal deficits, moving all 4 extremities Psychiatric: Normal mood and affect  Laboratory Data: Results for orders placed or performed in visit on 04/10/21  CULTURE, URINE COMPREHENSIVE   Specimen: Urine   Urine  Result Value Ref Range   Urine Culture, Comprehensive Preliminary report    Organism ID, Bacteria Comment   Microscopic Examination   Urine  Result Value Ref Range   WBC, UA 11-30 (A) 0 - 5 /hpf   RBC 11-30 (A) 0 - 2 /hpf   Epithelial Cells (non renal) 0-10 0 - 10 /hpf   Renal Epithel, UA 0-10 (A) None seen /hpf   Casts Present (A) None seen /lpf   Cast Type Hyaline casts N/A   Bacteria, UA None seen None seen/Few  Urinalysis, Complete  Result Value Ref Range   Specific Gravity, UA 1.015 1.005 - 1.030   pH, UA 6.5 5.0 - 7.5   Color, UA Yellow Yellow   Appearance Ur Hazy (A) Clear   Leukocytes,UA 1+ (A) Negative   Protein,UA 1+ (A) Negative/Trace   Glucose, UA Negative Negative   Ketones, UA Negative Negative   RBC, UA 2+ (A) Negative   Bilirubin, UA Negative Negative   Urobilinogen, Ur 0.2 0.2 - 1.0 mg/dL   Nitrite, UA Negative Negative   Microscopic Examination See below:   BLADDER SCAN AMB NON-IMAGING  Result Value Ref Range   Scan Result 464mL    Assessment & Plan:   1. Urinary retention Said outpatient voiding trial failed today.  We discussed Foley catheter replacement and he agreed, see separate procedure note for details.  I obtained a urine sample for UA and culture of his new catheter for testing and will treat as indicated per culture.  We had a lengthy conversation  today regarding the purpose of UDS and his management options.  I explained that the purpose of urodynamics is to determine if the etiology of his retention is obstructive or do to underlying bladder dysfunction.  I explained that he would be a candidate for an outlet procedure if the etiology were determined to be obstructive.  I explained that if his bladder is no longer functioning, then there is no surgical option to treat this problem and we will have to consider chronic indwelling Foley catheter versus CIC.  Patient expressed understanding.  We will schedule repeat catheter change in 4 weeks as a backup plan in case he has not yet had urodynamics.  Okay  to cancel if he has had testing with a catheter change.  Additionally, I shared his negative surgical pathology results with him.  He expressed understanding. - Urinalysis, Complete - CULTURE, URINE COMPREHENSIVE - BLADDER SCAN AMB NON-IMAGING   Return in about 4 weeks (around 05/08/2021) for Catheter exchange (in case UDS has not already been performed).  Debroah Loop, PA-C   I spent 35 minutes on the day of the encounter to include pre-visit record review, face-to-face time with the patient, and post-visit ordering of tests.   [facility] [address], [place], [place] [address] [phone]

## 2021-04-11 ENCOUNTER — Telehealth: Payer: Self-pay | Admitting: *Deleted

## 2021-04-11 LAB — URINALYSIS, COMPLETE
Bilirubin, UA: NEGATIVE
Glucose, UA: NEGATIVE
Ketones, UA: NEGATIVE
Nitrite, UA: NEGATIVE
Specific Gravity, UA: 1.015 (ref 1.005–1.030)
Urobilinogen, Ur: 0.2 mg/dL (ref 0.2–1.0)
pH, UA: 6.5 (ref 5.0–7.5)

## 2021-04-11 LAB — MICROSCOPIC EXAMINATION: Bacteria, UA: NONE SEEN

## 2021-04-11 LAB — BLADDER SCAN AMB NON-IMAGING

## 2021-04-11 NOTE — Telephone Encounter (Signed)
Notified patient as instructed, patient pleased. Discussed follow-up appointments, patient agrees  

## 2021-04-11 NOTE — Telephone Encounter (Signed)
-----   Message from Abbie Sons, MD sent at 04/10/2021  9:16 PM EDT ----- Urine cytology showed no cells suspicious for malignancy

## 2021-04-12 LAB — CULTURE, URINE COMPREHENSIVE

## 2021-04-13 ENCOUNTER — Telehealth: Payer: Self-pay

## 2021-04-13 NOTE — Telephone Encounter (Signed)
-----   Message from Debroah Loop, [place] sent at 04/13/2021  8:16 AM EDT ----- Ucx negative, dysuria was likely related to poor emptying. No abx needed. ----- Message ----- From: Lavone Neri Lab Results In Sent: 04/11/2021   5:38 AM EDT To: Debroah Loop, PA-C

## 2021-04-13 NOTE — Telephone Encounter (Signed)
Patient was unavailable, ok per DPR, notified patients wife of info below, she verbalized understanding.

## 2021-04-20 ENCOUNTER — Ambulatory Visit: Payer: Self-pay | Admitting: Urology

## 2021-04-30 ENCOUNTER — Telehealth: Payer: Self-pay | Admitting: Physician Assistant

## 2021-04-30 NOTE — Telephone Encounter (Signed)
Spoke with patient in regards to catheter leaking. Patient does report catheter is leaking somewhat around the insertion site however urine is still draining into catheter bag as it should. He denies any pain associated with the leaking. Patient is scheduled for Urodynamics on Thursday at which time his catheter will be changed out. Advised patient to use a protective undergarment in the meantime. Patient expressed understanding.

## 2021-04-30 NOTE — Telephone Encounter (Signed)
Patient called the office today to report that his catheter is leaking at the insertion site.  He is looking for advice.   Please call patient to advise.

## 2021-05-08 ENCOUNTER — Ambulatory Visit: Payer: HMO | Admitting: Physician Assistant

## 2021-05-08 ENCOUNTER — Other Ambulatory Visit: Payer: Self-pay

## 2021-05-18 ENCOUNTER — Encounter: Payer: Self-pay | Admitting: Urology

## 2021-05-18 ENCOUNTER — Other Ambulatory Visit: Payer: Self-pay

## 2021-05-18 ENCOUNTER — Other Ambulatory Visit: Payer: Self-pay | Admitting: Urology

## 2021-05-18 ENCOUNTER — Ambulatory Visit (INDEPENDENT_AMBULATORY_CARE_PROVIDER_SITE_OTHER): Payer: HMO | Admitting: Urology

## 2021-05-18 VITALS — BP 128/74 | HR 68 | Ht 66.0 in | Wt 175.0 lb

## 2021-05-18 DIAGNOSIS — R339 Retention of urine, unspecified: Secondary | ICD-10-CM

## 2021-05-18 NOTE — Progress Notes (Signed)
05/18/2021 12:19 PM   Adrian Chavez 1940/12/04 [id number]  Referring provider: Dion Body, MD [facility] [place],  [place] [address]  Chief Complaint  Patient presents with   Other    HPI: 80 y.o. male with urinary retention who presents for review of urodynamic study performed 05/03/2021.  For sensation was 179 cc, normal desire at 227 and strong urge at 233 Was noted to have an unstable bladder with uninhibited contractions to 69 cmH2O however he was able to inhibit without leakage He was unable to void and on tracing did not generate a detrusor contraction Prostate volume by CT calculated ~ 110 g Cystoscopy 04/2021 with prominent lateral lobe enlargement and moderate bladder neck elevation Bladder with moderate to severe trabeculation   PMH: Past Medical History:  Diagnosis Date   Anxiety    CHF (congestive heart failure) ([place])    Claustrophobia    Colitis, ischemic ([place]) 2010   developed DVT   DVT (deep venous thrombosis) ([place]) 2010   stomach and leg   Dysrhythmia    A-Fib   Hypertension     Surgical History: Past Surgical History:  Procedure Laterality Date   HERNIA REPAIR Right 2007   IMPLANTABLE CARDIOVERTER DEFIBRILLATOR (ICD) GENERATOR CHANGE Left 02/21/2016   Procedure: ICD GENERATOR CHANGE;  Surgeon: Isaias Cowman, MD;  Location: [facility] ORS;  Service: Cardiovascular;  Laterality: Left;   PACEMAKER INSERTION     PITUITARY SURGERY  2002   Done at [facility] Medications:  Allergies as of 05/18/2021       Reactions   Escitalopram Oxalate Nausea Only   Oxycodone    vomiting        Medication List        Accurate as of May 18, 2021 12:19 PM. If you have any questions, ask your nurse or doctor.          albuterol 108 (90 Base) MCG/ACT inhaler Commonly known as: VENTOLIN HFA SMARTSIG:1-2 Inhalation Via Inhaler Every 4 Hours PRN   ALPRAZolam 0.5 MG tablet Commonly known as: XANAX Take 0.5 mg by  mouth daily as needed for anxiety.   azelastine 0.1 % nasal spray Commonly known as: ASTELIN Place into the nose.   carvedilol 3.125 MG tablet Commonly known as: COREG Take 3.125 mg by mouth 2 (two) times daily.   cetirizine 10 MG tablet Commonly known as: ZYRTEC Take 1 tablet by mouth daily.   Cialis 5 MG tablet Generic drug: tadalafil Take 5 mg by mouth daily.   Entresto 49-51 MG Generic drug: sacubitril-valsartan Take 1 tablet by mouth 2 (two) times daily.   finasteride 5 MG tablet Commonly known as: Proscar Take 1 tablet (5 mg total) by mouth daily.   fluticasone 50 MCG/ACT nasal spray Commonly known as: FLONASE Place 2 sprays into both nostrils daily as needed for allergies or rhinitis.   potassium chloride SA 20 MEQ tablet Commonly known as: KLOR-CON Take 20 mEq by mouth 2 (two) times daily.   tamsulosin 0.4 MG Caps capsule Commonly known as: FLOMAX Take 1 capsule (0.4 mg total) by mouth daily.   torsemide 20 MG tablet Commonly known as: DEMADEX Take 20 mg by mouth 2 (two) times daily.   vitamin B-12 1000 MCG tablet Commonly known as: CYANOCOBALAMIN Take 1,000 mcg by mouth daily.   warfarin 1 MG tablet Commonly known as: COUMADIN Take 3-4 mg by mouth See admin instructions. Take 2 MG by mouth every Monday, Tuesday, 3  mg Wednesday, 2 mg Thursday and Friday evening and 3 MG by mouth every Saturday and Sunday evening        Allergies:  Allergies  Allergen Reactions   Escitalopram Oxalate Nausea Only   Oxycodone     vomiting    Family History: No family history on file.  Social History:  reports that he quit smoking about 47 years ago. His smoking use included cigarettes. He has never used smokeless tobacco. He reports that he does not drink alcohol and does not use drugs.   Physical Exam: BP 128/74   Pulse 68   Ht 5\' 6"  (1.676 m)   Wt 175 lb (79.4 kg)   BMI 28.25 kg/m   Constitutional:  Alert and oriented, No acute distress. HEENT: [place] AT,  moist mucus membranes.  Trachea midline, no masses. Cardiovascular: No clubbing, cyanosis, or edema. Respiratory: Normal respiratory effort, no increased work of breathing.   Assessment & Plan:    1.  Urinary retention Unable to void on ED study however did have unstable bladder contractions to 69 cm of water I will ask Dr. Matilde Sprang to review his UDS They stated they were told by the urodynamics nurse that some of the tertiary care centers do have additional procedures for BPH and I informed them the only one I was aware of was PAE. Based on medical comorbidities they state he is not able to have general anesthesia.  Prostate volume precludes TURP and UroLift. HoLEP could be performed under spinal anesthesia Prostatic artery embolization and Rezum were also discussed  I spent 40 total minutes on the day of the encounter including pre-visit review of the medical record, face-to-face time with the patient, and post visit ordering of labs/imaging/tests.

## 2021-05-22 ENCOUNTER — Telehealth: Payer: Self-pay | Admitting: Family Medicine

## 2021-05-22 DIAGNOSIS — R339 Retention of urine, unspecified: Secondary | ICD-10-CM

## 2021-05-22 NOTE — Telephone Encounter (Signed)
Tamsulosin and Finasteride were given and his INR was real high. He stopped both of the medications yesterday. His states his cardiologist is looking into which medication could be causing the INR to be so high. They are requesting a referral to either [facility] or [facility] for a 2nd opinion.

## 2021-05-22 NOTE — Telephone Encounter (Signed)
Patient called and states you were going to talk to another doctor about options on his bladder issues. He is wanting to know what to do next? Please advise

## 2021-05-22 NOTE — Telephone Encounter (Signed)
Dr. Matilde Sprang reviewed his chart, as directed study I spoke with him yesterday.  Based on his history of high urine volumes (~ 2 L) and inability to void on urodynamic study his chance of urinating with an outlet procedure is estimated at 20%.  They were requesting minimally invasive treatment options due to his medical comorbidities.  His prostate volume is too large for UroLift.  The available options would be prostatic artery embolization and Rezum.  With both of those procedures he would need a Foley catheter post procedure until the prostate shrinks.

## 2021-05-23 ENCOUNTER — Ambulatory Visit: Payer: HMO

## 2021-05-23 NOTE — Telephone Encounter (Signed)
Referral order to [facility] placed

## 2021-05-23 NOTE — Telephone Encounter (Signed)
Shared referral information with the patient and his wife.

## 2021-05-25 ENCOUNTER — Telehealth: Payer: Self-pay | Admitting: *Deleted

## 2021-05-25 NOTE — Telephone Encounter (Signed)
Patient reports catheter bag is draining normally, no clots. Patient does note his urine being a little orange in color, otherwise normal.

## 2021-05-25 NOTE — Telephone Encounter (Signed)
Pt calling stating he had his INR checked today and "they" said he might have blood in his urine. (See phone note from Dr. Netty Starring office) pt's urine was not checked. Please advise

## 2021-05-25 NOTE — Telephone Encounter (Signed)
Per the phone note from Dr. Raylene Miyamoto office, it seems that he has had gross hematuria for 2 days. This is likely due to supratherapeutic INR and should resolve with holding coumadin. Please confirm that his catheter continues to drain well, no clots, and no passage of thick, ketchup-like urine.

## 2021-05-27 ENCOUNTER — Encounter: Payer: Self-pay | Admitting: Urology

## 2021-06-05 ENCOUNTER — Other Ambulatory Visit: Payer: Self-pay

## 2021-06-05 ENCOUNTER — Ambulatory Visit (INDEPENDENT_AMBULATORY_CARE_PROVIDER_SITE_OTHER): Payer: HMO | Admitting: Physician Assistant

## 2021-06-05 DIAGNOSIS — R339 Retention of urine, unspecified: Secondary | ICD-10-CM | POA: Diagnosis not present

## 2021-06-05 NOTE — Patient Instructions (Addendum)
STOP Flomax (tamsulosin). CONTINUE finasteride.  Based on your history of high urine volumes (~ 2 L) and inability to urinate on urodynamic study in [place], your chance of urinating on your own after a prostate procedure is estimated at 20%.   Unfortunately, your prostate volume is too large for UroLift. The available options at this point would be prostatic artery embolization and Rezum.  With both of those procedures, you would need a Foley catheter post procedure until the prostate shrinks.  Dr. Bernardo Heater has placed a referral for a second opinion at [facility]. They will call you to schedule this. We'll plan for monthly catheter changes in our clinic until this occurs and/or you undergo a prostate procedure.

## 2021-06-05 NOTE — Progress Notes (Signed)
Cath Change/ Replacement  Patient is present today for a catheter change due to urinary retention.  13m of water was removed from the balloon, a 16FR foley cath was removed without difficulty.  Patient was cleaned and prepped in a sterile fashion with betadine and 2% lidocaine jelly was instilled into the urethra. A 16 FR foley cath was replaced into the bladder no complications were noted Urine return was noted 381mand urine was yellow in color. The balloon was filled with 1046mf sterile water. A leg bag was attached for drainage.  A night bag was also given to the patient and patient was given instruction on how to change from one bag to another. Patient was given proper instruction on catheter care.    Performed by: SamDebroah LoopA-C   Additional notes: Patient reports intermittent dizziness and wonders if his medications may be contributory. Given indwelling Foley, will discontinue Flomax today to reduce risk of orthostasis. Will continue finasteride to reduce his risk for gross hematuria on anticoagulation.  We had a lengthy conversation today regarding the outcome of his recent UDS study and Dr. StoDene Gentrycommendations. We reviewed that he estimates a 20% chance of spontaneous urination after undergoing a prostate procedure. Patient states he does not want to "throw in the towel" and remains interested in bladder outlet intervention. We discussed that his noninvasive options would be PAE or Rezum, and that [facility]. We discussed that he will require catheterization at least until he undergoes a procedure and his prostate shrinks and his options during this time include chronic indwelling Foley, SPT, and CIC. He wishes to continue chronic indwelling Foley. All questions answered.  Follow up: Return in about 4 weeks (around 07/03/2021) for Catheter exchange.    I spent 25 minutes on the day of the encounter to include pre-visit record review,  face-to-face time with the patient, and post-visit ordering of tests.

## 2021-06-07 ENCOUNTER — Ambulatory Visit: Payer: HMO | Admitting: Physician Assistant

## 2021-07-05 ENCOUNTER — Ambulatory Visit: Payer: Self-pay | Admitting: Physician Assistant

## 2021-07-11 ENCOUNTER — Ambulatory Visit: Payer: HMO | Admitting: Physician Assistant

## 2021-07-11 ENCOUNTER — Other Ambulatory Visit: Payer: Self-pay

## 2021-07-11 DIAGNOSIS — R339 Retention of urine, unspecified: Secondary | ICD-10-CM

## 2021-07-11 NOTE — Progress Notes (Signed)
Cath Change/ Replacement  Patient is present today for a catheter change due to urinary retention.  23m of water was removed from the balloon, a 16FR coude foley cath was removed without difficulty.  Patient was cleaned and prepped in a sterile fashion with betadine and 2% lidocaine jelly was instilled into the urethra. A 16 FR coude foley cath was replaced into the bladder no complications were noted Urine return was noted 111mand urine was yellow in color. The balloon was filled with 1091mf sterile water and the catheter was plugged. A night bag was also given to the patient for drainage. Patient tolerated well.    Performed by: SamDebroah LoopA-C   Additional notes: Patient prefers to plug his catheter during the day and switch to a night bag overnight. He drains his catheter approximately every 1-2 hours during the day. [facility] to Dr. GriTerrace Arabiagarding possible Rezum. Appt scheduled for 12/20/2021.  In the meantime, patient wishes to reattempt voiding trial and consider CIC teaching. We discussed that he is unlikely to pass another voiding trial and that if he fails CIC teaching, we can replace the Foley. Patient is in agreement with this plan.  He reports urine leaking around his tubing today, nonpainful. We discussed bladder spasms and I offered him pharmacotherapy, which he declined as he is already taking many medications.  Follow up: Return in about 2 weeks (around 07/25/2021) for Voiding trial with possible CIC teaching.    I spent 10 minutes on the day of the encounter to include pre-visit record review, face-to-face time with the patient, and post-visit ordering of tests.

## 2021-07-25 ENCOUNTER — Ambulatory Visit (INDEPENDENT_AMBULATORY_CARE_PROVIDER_SITE_OTHER): Payer: HMO | Admitting: Physician Assistant

## 2021-07-25 ENCOUNTER — Other Ambulatory Visit: Payer: Self-pay

## 2021-07-25 ENCOUNTER — Ambulatory Visit: Payer: HMO | Admitting: Physician Assistant

## 2021-07-25 DIAGNOSIS — R339 Retention of urine, unspecified: Secondary | ICD-10-CM

## 2021-07-25 LAB — BLADDER SCAN AMB NON-IMAGING

## 2021-07-25 NOTE — Patient Instructions (Signed)
I'll see you back in clinic tomorrow morning to scan your bladder again and make sure you're emptying okay. Until then (except for when you're sleeping), please urinate every 2 hours even if you do not feel the urge to go.  If you stop being able to urinate before your appointment tomorrow morning and develop lower abdominal pain, please go to the Emergency Department immediately. If this happens tomorrow morning after we open at 8am, please come straight here instead.

## 2021-07-25 NOTE — Progress Notes (Signed)
07/25/2021 3:02 PM   Adrian Chavez [birth year] [id number]  CC: Chief Complaint  Patient presents with   Urinary Retention   HPI: Adrian Chavez is a 80 y.o. male with PMH urinary retention with BPH who was unable to void on UDS and is awaiting follow up with Dr. Terrace Arabia regarding Rezum in February 2023 who presents today for repeat voiding trial.   I had a very frank conversation with the patient today that I am not optimistic he will be able to spontaneously void today. He is interested in considering CIC if he is unable; alternatively will plan for Foley replacement.  Foley catheter removed in the morning, see procedure note below for further information. He returned to clinic in the afternoon for PVR. He reports drinking ~24oz of fluid. He has been able to void several times and describes urinary stream similar to before his recent history of retention. PVR 227mL.  PMH: Past Medical History:  Diagnosis Date   Anxiety    CHF (congestive heart failure) ([place])    Claustrophobia    Colitis, ischemic ([place]) 2010   developed DVT   DVT (deep venous thrombosis) ([place]) 2010   stomach and leg   Dysrhythmia    A-Fib   Hypertension     Surgical History: Past Surgical History:  Procedure Laterality Date   HERNIA REPAIR Right 2007   IMPLANTABLE CARDIOVERTER DEFIBRILLATOR (ICD) GENERATOR CHANGE Left 02/21/2016   Procedure: ICD GENERATOR CHANGE;  Surgeon: Isaias Cowman, MD;  Location: [facility] ORS;  Service: Cardiovascular;  Laterality: Left;   PACEMAKER INSERTION     PITUITARY SURGERY  2002   Done at [facility] Medications:  Allergies as of 07/25/2021       Reactions   Escitalopram Oxalate Nausea Only   Oxycodone    vomiting        Medication List        Accurate as of July 25, 2021  3:02 PM. If you have any questions, ask your nurse or doctor.          albuterol 108 (90 Base) MCG/ACT inhaler Commonly known as: VENTOLIN HFA SMARTSIG:1-2 Inhalation Via Inhaler  Every 4 Hours PRN   ALPRAZolam 0.5 MG tablet Commonly known as: XANAX Take 0.5 mg by mouth daily as needed for anxiety.   azelastine 0.1 % nasal spray Commonly known as: ASTELIN Place into the nose.   carvedilol 3.125 MG tablet Commonly known as: COREG Take 3.125 mg by mouth 2 (two) times daily.   cetirizine 10 MG tablet Commonly known as: ZYRTEC Take 1 tablet by mouth daily.   Cialis 5 MG tablet Generic drug: tadalafil Take 5 mg by mouth daily.   Entresto 49-51 MG Generic drug: sacubitril-valsartan Take 1 tablet by mouth 2 (two) times daily.   finasteride 5 MG tablet Commonly known as: Proscar Take 1 tablet (5 mg total) by mouth daily.   fluticasone 50 MCG/ACT nasal spray Commonly known as: FLONASE Place 2 sprays into both nostrils daily as needed for allergies or rhinitis.   potassium chloride SA 20 MEQ tablet Commonly known as: KLOR-CON Take 20 mEq by mouth 2 (two) times daily.   torsemide 20 MG tablet Commonly known as: DEMADEX Take 20 mg by mouth 2 (two) times daily.   vitamin B-12 1000 MCG tablet Commonly known as: CYANOCOBALAMIN Take 1,000 mcg by mouth daily.   warfarin 1 MG tablet Commonly known as: COUMADIN Take 3-4 mg by mouth See admin instructions. Take 2 MG  by mouth every Monday, Tuesday, 3 mg Wednesday, 2 mg Thursday and Friday evening and 3 MG by mouth every Saturday and Sunday evening        Allergies:  Allergies  Allergen Reactions   Escitalopram Oxalate Nausea Only   Oxycodone     vomiting    Family History: No family history on file.  Social History:   reports that he quit smoking about 47 years ago. His smoking use included cigarettes. He has never used smokeless tobacco. He reports that he does not drink alcohol and does not use drugs.  Physical Exam: There were no vitals taken for this visit.  Constitutional:  Alert and oriented, no acute distress, nontoxic appearing HEENT: [place], AT Cardiovascular: No clubbing, cyanosis, or  edema Respiratory: Normal respiratory effort, no increased work of breathing Skin: No rashes, bruises or suspicious lesions Neurologic: Grossly intact, no focal deficits, moving all 4 extremities Psychiatric: Normal mood and affect  Laboratory Data: Results for orders placed or performed in visit on 07/25/21  BLADDER SCAN AMB NON-IMAGING  Result Value Ref Range   Scan Result 219mL    Catheter Removal  Patient is present today for a catheter removal.  32ml of water was drained from the balloon. A 16FR coude foley cath was removed from the bladder no complications were noted . Patient tolerated well.  Performed by: Debroah Loop, PA-C   Follow up/ Additional notes: Push fluids and RTC this afternoon for PVR.   Assessment & Plan:   1. Urinary retention Voiding trial passed today. Given his history of multiple failed voiding trials, I recommended CIC teaching versus repeat bladder scan tomorrow morning. He elected for the latter. Counseled him to go to the ED overnight if he loses the ability to void or has lower abdominal pain. Counseled him on timed voiding every 2 hours until I see him tomorrow. He expressed understanding.  If his residual is elevated tomorrow, proceed with CIC teaching vs Foley replacement.  Return in about 1 day (around 07/26/2021) for Repeat PVR.  Debroah Loop, PA-C  [facility] [address] [place], [place] [address] [phone]

## 2021-07-26 ENCOUNTER — Ambulatory Visit: Payer: HMO | Admitting: Physician Assistant

## 2021-07-26 DIAGNOSIS — R339 Retention of urine, unspecified: Secondary | ICD-10-CM | POA: Diagnosis not present

## 2021-07-26 LAB — BLADDER SCAN AMB NON-IMAGING

## 2021-07-26 NOTE — Progress Notes (Signed)
Patient returned to clinic today for repeat PVR.  He reports timed voiding every 2 hours since his last clinic visit yesterday.  He has been able to void.  He feels he needs to void this morning but denies lower abdominal pain.  PVR >491mL.  Results for orders placed or performed in visit on 07/26/21  Bladder Scan (Post Void Residual) in office  Result Value Ref Range   Scan Result >480mL     Patient wishes to proceed with CIC teaching today, see procedure note below.  Continuous Intermittent Catheterization  Due to incomplete bladder emptying patient is present today for a teaching of self I & O Catheterization. Patient was given detailed verbal and printed instructions of self catheterization. Patient was cleaned and prepped in a clean fashion.  With instruction and assistance patient inserted a 14FR coude [employer] SpeediCath Standard and urine return was noted 475 ml, urine was yellow in color. Patient tolerated well, no complications were noted Patient was given a sample bag with supplies to take home.  Instructions were given for patient to cath 3 times daily to supplement spontaneous voiding.  An order was placed with [facility] for catheters to be sent to the patient's home.  Performed by: Debroah Loop, PA-C   Additional Notes: Return in about 7 months (around 02/23/2022) for Symptom recheck and follow-up with Dr. Bernardo Heater.

## 2021-07-26 NOTE — Patient Instructions (Signed)
I would like you to start self-cathing 3 times per day (first thing in the morning, midday, and right before bed). You should continue to urinate on your own as needed. If you feel like you need to urinate and can't empty your bladder on your own, you may self-cath as well.  If you notice that you start to get more than 338mL out of your bladder every time you self-cath, then increase how often you are self-cathing by one time daily.     Step 1 Get all of your supplies ready and place near you. Step 2 Wash your hands, or put on gloves. Step 3 Wash around the tip of your penis with warm antibacterial soapy water. Step 4 Take catheter out of package and drain the lubricant over toilet. Step 5 While holding the penis at a 45 degree angle from the stomach in one hand and the catheter in the other hand  Step 6 Insert the catheter slowly into your urethra. If there is resistance when the catheter reaches the sphincter muscle,              take a deep breath and gently apply steady pressure.              DO NOT FORCE THE CATHETER Step 7 When the urine begins to flow insert another inch and lower penis. Allow the urine to flow into the toilet. Step 8 When the flow of urine stops, slowly remove the catheter.

## 2021-07-27 ENCOUNTER — Other Ambulatory Visit: Payer: Self-pay | Admitting: Urology

## 2021-08-07 ENCOUNTER — Ambulatory Visit (INDEPENDENT_AMBULATORY_CARE_PROVIDER_SITE_OTHER): Payer: HMO | Admitting: Urology

## 2021-08-07 ENCOUNTER — Other Ambulatory Visit: Payer: Self-pay

## 2021-08-07 ENCOUNTER — Telehealth: Payer: Self-pay | Admitting: Physician Assistant

## 2021-08-07 ENCOUNTER — Encounter: Payer: Self-pay | Admitting: Urology

## 2021-08-07 DIAGNOSIS — R339 Retention of urine, unspecified: Secondary | ICD-10-CM

## 2021-08-07 DIAGNOSIS — R310 Gross hematuria: Secondary | ICD-10-CM | POA: Diagnosis not present

## 2021-08-07 LAB — BLADDER SCAN AMB NON-IMAGING: Scan Result: 361

## 2021-08-07 NOTE — Progress Notes (Signed)
08/07/2021 5:33 PM   Adrian Chavez [birth year] [id number]  Referring provider: Dion Body, MD [place] [facility], [facility] [place],  [place] [address]  Chief Complaint  Patient presents with   Urinary Retention   Urological history: 1. Urinary retention -presented to ED on 03/06/2021 -Foley placed for 2 L return -failed TOV on 03/19/2021 -cysto 04/2021-Prominent lateral lobe enlargement prostate  - Moderate elevation bladder neck - Moderate to severe trabeculation-scattered cellules diverticula   2. BPH  -prostate volume 111 cc on recent CT   3. Elevated PSA -PSA 11.8 in 2017 -per patient prostate biopsy with inconclusive results ~ 15 years ago   4. High risk hematuria -former smoker -Dr. Jacqlyn Larsen in 2014 for gross hematuria and CT urogram/cystoscopy recommended however this apparently was never scheduled or performed -reports of gross heme -cysto 04/2021 -  inflammatory changes bladder base most likely secondary to indwelling Foley- urine cytology negative -UA > 30 RBC's - likely due to catheter trauma/UTI  HPI: Adrian Chavez is a 80 y.o. male who is managing his BPH with urinary retention with CIC who attempted a self cath today, met resistance and continued to force the catheter into the urethra and experienced gross heme.  UA nitrite positive, > 30 RBC's and many bacteria.  PVR 361 mL  Patient denies any modifying or aggravating factors.  Patient denies any dysuria or suprapubic/flank pain.  Patient denies any fevers, chills, nausea or vomiting.      PMH: Past Medical History:  Diagnosis Date   Anxiety    CHF (congestive heart failure) ([place])    Claustrophobia    Colitis, ischemic ([place]) 2010   developed DVT   DVT (deep venous thrombosis) ([place]) 2010   stomach and leg   Dysrhythmia    A-Fib   Hypertension     Surgical History: Past Surgical History:  Procedure Laterality Date   HERNIA REPAIR Right 2007   IMPLANTABLE CARDIOVERTER  DEFIBRILLATOR (ICD) GENERATOR CHANGE Left 02/21/2016   Procedure: ICD GENERATOR CHANGE;  Surgeon: Isaias Cowman, MD;  Location: [facility] ORS;  Service: Cardiovascular;  Laterality: Left;   PACEMAKER INSERTION     PITUITARY SURGERY  2002   Done at [facility] Medications:  Allergies as of 08/07/2021       Reactions   Escitalopram Oxalate Nausea Only   Oxycodone    vomiting        Medication List        Accurate as of August 07, 2021 11:59 PM. If you have any questions, ask your nurse or doctor.          albuterol 108 (90 Base) MCG/ACT inhaler Commonly known as: VENTOLIN HFA SMARTSIG:1-2 Inhalation Via Inhaler Every 4 Hours PRN   ALPRAZolam 0.5 MG tablet Commonly known as: XANAX Take 0.5 mg by mouth daily as needed for anxiety.   azelastine 0.1 % nasal spray Commonly known as: ASTELIN Place into the nose.   carvedilol 3.125 MG tablet Commonly known as: COREG Take 3.125 mg by mouth 2 (two) times daily.   cetirizine 10 MG tablet Commonly known as: ZYRTEC Take 1 tablet by mouth daily.   Cialis 5 MG tablet Generic drug: tadalafil Take 5 mg by mouth daily.   Entresto 49-51 MG Generic drug: sacubitril-valsartan Take 1 tablet by mouth 2 (two) times daily.   finasteride 5 MG tablet Commonly known as: Proscar Take 1 tablet (5 mg total) by mouth daily.   fluticasone 50 MCG/ACT nasal spray  Commonly known as: FLONASE Place 2 sprays into both nostrils daily as needed for allergies or rhinitis.   potassium chloride SA 20 MEQ tablet Commonly known as: KLOR-CON Take 20 mEq by mouth 2 (two) times daily.   torsemide 20 MG tablet Commonly known as: DEMADEX Take 20 mg by mouth 2 (two) times daily.   vitamin B-12 1000 MCG tablet Commonly known as: CYANOCOBALAMIN Take 1,000 mcg by mouth daily.   warfarin 1 MG tablet Commonly known as: COUMADIN Take 3-4 mg by mouth See admin instructions. Take 2 MG by mouth every Monday, Tuesday, 3 mg Wednesday, 2 mg Thursday and  Friday evening and 3 MG by mouth every Saturday and Sunday evening        Allergies:  Allergies  Allergen Reactions   Escitalopram Oxalate Nausea Only   Oxycodone     vomiting    Family History: History reviewed. No pertinent family history.  Social History:  reports that he quit smoking about 47 years ago. His smoking use included cigarettes. He has never used smokeless tobacco. He reports that he does not drink alcohol and does not use drugs.  ROS: Pertinent ROS in HPI  Physical Exam: Constitutional:  Well nourished. Alert and oriented, No acute distress. HEENT: [place] AT, moist mucus membranes.  Trachea midline Cardiovascular: No clubbing, cyanosis, or edema. Respiratory: Normal respiratory effort, no increased work of breathing. GU: No CVA tenderness.  No bladder fullness or masses.  Patient with uncircumcised phallus. Foreskin easily retracted  Urethral meatus is patent.  No penile discharge. No penile lesions or rashes. Scrotum without lesions, cysts, rashes and/or edema.   Neurologic: Grossly intact, no focal deficits, moving all 4 extremities. Psychiatric: Normal mood and affect.  Laboratory Data: Lab Results  Component Value Date   WBC 6.9 03/07/2021   HGB 15.2 03/07/2021   HCT 46.8 03/07/2021   MCV 88.1 03/07/2021   PLT 146 (L) 03/07/2021    Lab Results  Component Value Date   CREATININE 1.76 (H) 03/07/2021    Lab Results  Component Value Date   AST 18 08/10/2018   Lab Results  Component Value Date   ALT 10 08/10/2018    Urinalysis Component     Latest Ref Rng & Units 08/07/2021  Specific Gravity, UA     1.005 - 1.030 1.015  pH, UA     5.0 - 7.5 7.5  Color, UA     Yellow Red (A)  Appearance Ur     Clear Cloudy (A)  Leukocytes,UA     Negative Trace (A)  Protein,UA     Negative/Trace 3+ (A)  Glucose, UA     Negative Negative  Ketones, UA     Negative Trace (A)  RBC, UA     Negative 3+ (A)  Bilirubin, UA     Negative Negative   Urobilinogen, Ur     0.2 - 1.0 mg/dL 2.0 (H)  Nitrite, UA     Negative Positive (A)  Microscopic Examination      See below:   Component     Latest Ref Rng & Units 08/07/2021  WBC, UA     0 - 5 /hpf 0-5  RBC     0 - 2 /hpf >30 (A)  Epithelial Cells (non renal)     0 - 10 /hpf 0-10  Bacteria, UA     None seen/Few Many (A)  I have reviewed the labs.   Pertinent Imaging: Results for Adrian Chavez, Adrian Chavez (MRN [id number]) as of  08/07/2021 10:51  Ref. Range 08/07/2021 10:14  Scan Result Unknown [id number]   Simple Catheter Placement Due to urinary retention patient is present today for a foley cath placement.  Patient was cleaned and prepped in a sterile fashion with betadine. A 18 FR Coude foley catheter was inserted, urine return was noted  350 ml, urine was merlot in color.  The balloon was filled with 10cc of sterile water.  A leg bag was attached for drainage. Patient was also given a night bag to take home and was given instruction on how to change from one bag to another.  Patient was given instruction on proper catheter care.  Patient tolerated well, no complications were noted   Performed by: Zara Council, PA-C and Kyra Manges, CMA  Assessment & Plan:    1. Gross hematuria -likely secondary to traumatic self cath -indwelling Foley x one week  -continue finasteride 5 mg daily   2. Urinary retention -Bladder Scan (Post Void Residual) in office -managing with CIC  -has an appointment on 12/20/2021 with Dr. Mali Michael Gridley for evaluation for a possible Rezum procedure    Return in about 1 week (around 08/14/2021) for Foley removal .  These notes generated with voice recognition software. I apologize for typographical errors.  Zara Council, PA-C  [facility] [address]  [place], [place] [address] [phone]

## 2021-08-08 LAB — MICROSCOPIC EXAMINATION: RBC, Urine: >30 /hpf — AB (ref 0–2)

## 2021-08-08 LAB — URINALYSIS, COMPLETE
Bilirubin, UA: NEGATIVE
Glucose, UA: NEGATIVE
Nitrite, UA: POSITIVE — AB
Specific Gravity, UA: 1.015 (ref 1.005–1.030)
Urobilinogen, Ur: 2 mg/dL — ABNORMAL HIGH (ref 0.2–1.0)
pH, UA: 7.5 (ref 5.0–7.5)

## 2021-08-13 NOTE — Progress Notes (Signed)
08/14/2021 9:13 AM   Adrian Chavez 11-Jul-1941 [id number]  Referring provider: Dion Body, MD [facility] [place],   [address]  Chief Complaint  Patient presents with   Urinary Retention    Urological history: 1. Urinary retention -presented to ED on 03/06/2021 -Foley placed for 2 L return -failed TOV on 03/19/2021 -cysto 04/2021-Prominent lateral lobe enlargement prostate  - Moderate elevation bladder neck - Moderate to severe trabeculation-scattered cellules diverticula   2. BPH  -prostate volume 111 cc on recent CT   3. Elevated PSA -PSA 11.8 in 2017 -per patient prostate biopsy with inconclusive results ~ 15 years ago   4. High risk hematuria -former smoker -Dr. Jacqlyn Larsen in 2014 for gross hematuria and CT urogram/cystoscopy recommended however this apparently was never scheduled or performed -reports of gross heme -cysto 04/2021 -  inflammatory changes bladder base most likely secondary to indwelling Foley- urine cytology negative -UA > 30 RBC's - likely due to catheter trauma/UTI  HPI: Adrian Chavez is a 80 y.o. male who experienced gross heme while attempting to self cath last week.   A Foley was placed to allow for healing of the traumatized urethra.    He states the catheter has been very uncomfortable and wants it out.  He has dark colored urine in his leg bag.    Patient denies any modifying or aggravating factors.  Patient denies any gross hematuria, dysuria or suprapubic/flank pain.  Patient denies any fevers, chills, nausea or vomiting.    He is also concerned going forward with a self cathing and having more episodes of urethral trauma.  He states he is currently using a normal straight cath which is on the firmer side.  He is also cathing upwards to 5 times daily with PVRs below 500 cc.  PMH: Past Medical History:  Diagnosis Date   Anxiety    CHF (congestive heart failure) ([place])    Claustrophobia    Colitis,  ischemic ([place]) 2010   developed DVT   DVT (deep venous thrombosis) ([place]) 2010   stomach and leg   Dysrhythmia    A-Fib   Hypertension     Surgical History: Past Surgical History:  Procedure Laterality Date   HERNIA REPAIR Right 2007   IMPLANTABLE CARDIOVERTER DEFIBRILLATOR (ICD) GENERATOR CHANGE Left 02/21/2016   Procedure: ICD GENERATOR CHANGE;  Surgeon: Isaias Cowman, MD;  Location: [facility] ORS;  Service: Cardiovascular;  Laterality: Left;   PACEMAKER INSERTION     PITUITARY SURGERY  2002   Done at [facility] Medications:  Allergies as of 08/14/2021       Reactions   Escitalopram Oxalate Nausea Only   Oxycodone    vomiting        Medication List        Accurate as of August 14, 2021  9:13 AM. If you have any questions, ask your nurse or doctor.          albuterol 108 (90 Base) MCG/ACT inhaler Commonly known as: VENTOLIN HFA SMARTSIG:1-2 Inhalation Via Inhaler Every 4 Hours PRN   ALPRAZolam 0.5 MG tablet Commonly known as: XANAX Take 0.5 mg by mouth daily as needed for anxiety.   azelastine 0.1 % nasal spray Commonly known as: ASTELIN Place into the nose.   carvedilol 3.125 MG tablet Commonly known as: COREG Take 3.125 mg by mouth 2 (two) times daily.   cetirizine 10 MG tablet Commonly known as: ZYRTEC Take 1 tablet by mouth  daily.   Cialis 5 MG tablet Generic drug: tadalafil Take 5 mg by mouth daily.   Entresto 49-51 MG Generic drug: sacubitril-valsartan Take 1 tablet by mouth 2 (two) times daily.   finasteride 5 MG tablet Commonly known as: Proscar Take 1 tablet (5 mg total) by mouth daily.   fluticasone 50 MCG/ACT nasal spray Commonly known as: FLONASE Place 2 sprays into both nostrils daily as needed for allergies or rhinitis.   potassium chloride SA 20 MEQ tablet Commonly known as: KLOR-CON Take 20 mEq by mouth 2 (two) times daily.   torsemide 20 MG tablet Commonly known as: DEMADEX Take 20 mg by mouth 2 (two) times  daily.   vitamin B-12 1000 MCG tablet Commonly known as: CYANOCOBALAMIN Take 1,000 mcg by mouth daily.   warfarin 1 MG tablet Commonly known as: COUMADIN Take 3-4 mg by mouth See admin instructions. Take 2 MG by mouth every Monday, Tuesday, 3 mg Wednesday, 2 mg Thursday and Friday evening and 3 MG by mouth every Saturday and Sunday evening        Allergies:  Allergies  Allergen Reactions   Escitalopram Oxalate Nausea Only   Oxycodone     vomiting    Family History: No family history on file.  Social History:  reports that he quit smoking about 47 years ago. His smoking use included cigarettes. He has never used smokeless tobacco. He reports that he does not drink alcohol and does not use drugs.  ROS: Pertinent ROS in HPI  Physical Exam: Constitutional:  Well nourished. Alert and oriented, No acute distress. HEENT: [place] AT, mask in place.  Trachea midline Cardiovascular: No clubbing, cyanosis, or edema. Respiratory: Normal respiratory effort, no increased work of breathing. GU: No CVA tenderness.  No bladder fullness or masses.  Patient with uncircumcised phallus. Foreskin easily retracted  Urethral meatus is patent.  No penile discharge. No penile lesions or rashes. Scrotum without lesions, cysts, rashes and/or edema.   Neurologic: Grossly intact, no focal deficits, moving all 4 extremities. Psychiatric: Normal mood and affect.   Laboratory Data: N/A    Pertinent Imaging: N/A   Assessment & Plan:    1. Gross hematuria -resolved-likely due to catheterization trauma -continue finasteride 5 mg daily  -We will recheck UA in 1 month to ensure micro heme has resolved  2. Urinary retention -Bladder Scan (Post Void Residual) in office -managing with CIC -advised to reduce cathing to 3 times daily unless he develops suprapubic pain or inability to urinate -gave samples of [employer]'s Speedicath/Flex coude 14 Fr to try  -has an appointment on 12/20/2021 with Dr. Mali  Michael Gridley for evaluation for a possible Rezum procedure    Return in about 1 month (around 09/14/2021) for recheck UA .  These notes generated with voice recognition software. I apologize for typographical errors.  Zara Council, PA-C  [facility] [address]  [place], [place] [address] [phone]   I spent 15 minutes on the day of the encounter to include pre-visit record review, face-to-face time with the patient, and post-visit ordering of tests.

## 2021-08-14 ENCOUNTER — Other Ambulatory Visit: Payer: Self-pay

## 2021-08-14 ENCOUNTER — Ambulatory Visit (INDEPENDENT_AMBULATORY_CARE_PROVIDER_SITE_OTHER): Payer: HMO | Admitting: Urology

## 2021-08-14 DIAGNOSIS — R310 Gross hematuria: Secondary | ICD-10-CM | POA: Diagnosis not present

## 2021-08-14 DIAGNOSIS — R339 Retention of urine, unspecified: Secondary | ICD-10-CM

## 2021-08-15 ENCOUNTER — Ambulatory Visit: Payer: HMO | Admitting: Urology

## 2021-08-15 NOTE — Progress Notes (Incomplete)
08/15/21 1:21 PM   Adrian Chavez 1941-02-26 [id number]  Referring provider:  Dion Body, MD [facility] [place],  [place] [address] No chief complaint on file.   Urological history  1. Urinary retention -presented to ED on 03/06/2021 -Foley placed for 2 L return -failed TOV on 03/19/2021 -cysto 04/2021-Prominent lateral lobe enlargement prostate  - Moderate elevation bladder neck - Moderate to severe trabeculation-scattered cellules diverticula   2. BPH  -prostate volume 111 cc on recent CT   3. Elevated PSA -PSA 11.8 in 2017 -per patient prostate biopsy with inconclusive results ~ 15 years ago   4. High risk hematuria -former smoker -Dr. Jacqlyn Larsen in 2014 for gross hematuria and CT urogram/cystoscopy recommended however this apparently was never scheduled or performed -reports of gross heme -cysto 04/2021 -  inflammatory changes bladder base most likely secondary to indwelling Foley- urine cytology negative      HPI: Adrian Chavez is a 80 y.o.male who presents today for cath teaching.       PMH: Past Medical History:  Diagnosis Date   Anxiety    CHF (congestive heart failure) ([place])    Claustrophobia    Colitis, ischemic ([place]) 2010   developed DVT   DVT (deep venous thrombosis) ([place]) 2010   stomach and leg   Dysrhythmia    A-Fib   Hypertension     Surgical History: Past Surgical History:  Procedure Laterality Date   HERNIA REPAIR Right 2007   IMPLANTABLE CARDIOVERTER DEFIBRILLATOR (ICD) GENERATOR CHANGE Left 02/21/2016   Procedure: ICD GENERATOR CHANGE;  Surgeon: Isaias Cowman, MD;  Location: [facility] ORS;  Service: Cardiovascular;  Laterality: Left;   PACEMAKER INSERTION     PITUITARY SURGERY  2002   Done at [facility] Medications:  Allergies as of 08/16/2021       Reactions   Escitalopram Oxalate Nausea Only   Oxycodone    vomiting        Medication List        Accurate as of August 15, 2021  1:21 PM.  If you have any questions, ask your nurse or doctor.          albuterol 108 (90 Base) MCG/ACT inhaler Commonly known as: VENTOLIN HFA SMARTSIG:1-2 Inhalation Via Inhaler Every 4 Hours PRN   ALPRAZolam 0.5 MG tablet Commonly known as: XANAX Take 0.5 mg by mouth daily as needed for anxiety.   azelastine 0.1 % nasal spray Commonly known as: ASTELIN Place into the nose.   carvedilol 3.125 MG tablet Commonly known as: COREG Take 3.125 mg by mouth 2 (two) times daily.   cetirizine 10 MG tablet Commonly known as: ZYRTEC Take 1 tablet by mouth daily.   Cialis 5 MG tablet Generic drug: tadalafil Take 5 mg by mouth daily.   Entresto 49-51 MG Generic drug: sacubitril-valsartan Take 1 tablet by mouth 2 (two) times daily.   finasteride 5 MG tablet Commonly known as: Proscar Take 1 tablet (5 mg total) by mouth daily.   fluticasone 50 MCG/ACT nasal spray Commonly known as: FLONASE Place 2 sprays into both nostrils daily as needed for allergies or rhinitis.   potassium chloride SA 20 MEQ tablet Commonly known as: KLOR-CON Take 20 mEq by mouth 2 (two) times daily.   torsemide 20 MG tablet Commonly known as: DEMADEX Take 20 mg by mouth 2 (two) times daily.   vitamin B-12 1000 MCG tablet Commonly known as: CYANOCOBALAMIN Take 1,000 mcg by mouth daily.  warfarin 1 MG tablet Commonly known as: COUMADIN Take 3-4 mg by mouth See admin instructions. Take 2 MG by mouth every Monday, Tuesday, 3 mg Wednesday, 2 mg Thursday and Friday evening and 3 MG by mouth every Saturday and Sunday evening        Allergies:  Allergies  Allergen Reactions   Escitalopram Oxalate Nausea Only   Oxycodone     vomiting    Family History: No family history on file.  Social History:  reports that he quit smoking about 47 years ago. His smoking use included cigarettes. He has never used smokeless tobacco. He reports that he does not drink alcohol and does not use drugs.   Physical  Exam: There were no vitals taken for this visit.  Constitutional:  Alert and oriented, No acute distress. HEENT: [place] AT, moist mucus membranes.  Trachea midline, no masses. Cardiovascular: No clubbing, cyanosis, or edema. Respiratory: Normal respiratory effort, no increased work of breathing. GI: Abdomen is soft, nontender, nondistended, no abdominal masses GU: No CVA tenderness Lymph: No cervical or inguinal lymphadenopathy. Skin: No rashes, bruises or suspicious lesions. Neurologic: Grossly intact, no focal deficits, moving all 4 extremities. Psychiatric: Normal mood and affect.  Laboratory Data:  Lab Results  Component Value Date   CREATININE 1.76 (H) 03/07/2021      Urinalysis   Pertinent Imaging:   Assessment & Plan:     No follow-ups on file.  [facility] [address] [place], [place] [address] [phone]  I,Kailey Littlejohn,acting as a scribe for [facility], PA-C.,have documented all relevant documentation on the behalf of SHANNON MCGOWAN, PA-C,as directed by  [facility], PA-C while in the presence of [place], PA-C.

## 2021-08-16 ENCOUNTER — Ambulatory Visit: Payer: HMO | Admitting: Urology

## 2021-08-17 ENCOUNTER — Other Ambulatory Visit: Payer: Self-pay | Admitting: *Deleted

## 2021-08-17 LAB — CULTURE, URINE COMPREHENSIVE

## 2021-08-17 MED ORDER — CEFUROXIME AXETIL 500 MG PO TABS: 500.0000 mg | ORAL_TABLET | Freq: Two times a day (BID) | ORAL | 0 refills | Status: AC

## 2021-09-17 NOTE — Progress Notes (Signed)
09/18/2021 9:51 AM   Adrian Chavez 09/26/1941 [id number]  Referring provider: Dion Body, MD [facility] [place],  [place] [address]  Chief Complaint  Patient presents with   Hematuria    Urological history: 1. Urinary retention -presented to ED on 03/06/2021 -Foley placed for 2 L return -failed TOV on 03/19/2021 -cysto 04/2021-Prominent lateral lobe enlargement prostate  - Moderate elevation bladder neck - Moderate to severe trabeculation-scattered cellules diverticula   2. BPH  -prostate volume 111 cc on recent CT   3. Elevated PSA -PSA 11.8 in 2017 -per patient prostate biopsy with inconclusive results ~ 15 years ago   4. High risk hematuria -former smoker -Dr. Jacqlyn Larsen in 2014 for gross hematuria and CT urogram/cystoscopy recommended however this apparently was never scheduled or performed -reports of gross heme -CT renal stone 2022 - No urolithiasis. No hydronephrosis -RUS 2022 - No hydronephrosis or hydroureter to indicate obstruction contributing to the patient's current renal insufficiency. Renal echogenicity is within normal limits. Low-level perirenal stranding/fluid, as shown on recent CT. -cysto 04/2021 -  inflammatory changes bladder base most likely secondary to indwelling Foley- urine cytology negative -UA 3-10 RBC's - likely due to catheterization  HPI: Adrian Chavez is a 80 y.o. male who presents today for a recheck on his UA micro heme.  He presented on 08/07/2021 with an episode of gross heme after self cath.  He had a Foley placed for one week and then returned to self cathing.     UA 3-10 RBC's   He has not had any further gross heme with cathing.  He is comfortable with cathing at this time.  Patient denies any modifying or aggravating factors.  Patient denies any gross hematuria, dysuria or suprapubic/flank pain.  Patient denies any fevers, chills, nausea or vomiting.    PMH: Past Medical History:  Diagnosis  Date   Anxiety    CHF (congestive heart failure) ([place])    Claustrophobia    Colitis, ischemic ([place]) 2010   developed DVT   DVT (deep venous thrombosis) ([place]) 2010   stomach and leg   Dysrhythmia    A-Fib   Hypertension     Surgical History: Past Surgical History:  Procedure Laterality Date   HERNIA REPAIR Right 2007   IMPLANTABLE CARDIOVERTER DEFIBRILLATOR (ICD) GENERATOR CHANGE Left 02/21/2016   Procedure: ICD GENERATOR CHANGE;  Surgeon: Isaias Cowman, MD;  Location: [facility] ORS;  Service: Cardiovascular;  Laterality: Left;   PACEMAKER INSERTION     PITUITARY SURGERY  2002   Done at [facility] Medications:  Allergies as of 09/18/2021       Reactions   Escitalopram Oxalate Nausea Only   Oxycodone    vomiting        Medication List        Accurate as of September 18, 2021  9:51 AM. If you have any questions, ask your nurse or doctor.          albuterol 108 (90 Base) MCG/ACT inhaler Commonly known as: VENTOLIN HFA SMARTSIG:1-2 Inhalation Via Inhaler Every 4 Hours PRN   ALPRAZolam 0.5 MG tablet Commonly known as: XANAX Take 0.5 mg by mouth daily as needed for anxiety.   azelastine 0.1 % nasal spray Commonly known as: ASTELIN Place into the nose.   carvedilol 3.125 MG tablet Commonly known as: COREG Take 3.125 mg by mouth 2 (two) times daily.   cetirizine 10 MG tablet Commonly known as: ZYRTEC Take 1  tablet by mouth daily.   Cialis 5 MG tablet Generic drug: tadalafil Take 5 mg by mouth daily.   Entresto 49-51 MG Generic drug: sacubitril-valsartan Take 1 tablet by mouth 2 (two) times daily.   finasteride 5 MG tablet Commonly known as: Proscar Take 1 tablet (5 mg total) by mouth daily.   fluticasone 50 MCG/ACT nasal spray Commonly known as: FLONASE Place 2 sprays into both nostrils daily as needed for allergies or rhinitis.   potassium chloride SA 20 MEQ tablet Commonly known as: KLOR-CON Take 20 mEq by mouth 2 (two) times daily.    torsemide 20 MG tablet Commonly known as: DEMADEX Take 20 mg by mouth 2 (two) times daily.   vitamin B-12 1000 MCG tablet Commonly known as: CYANOCOBALAMIN Take 1,000 mcg by mouth daily.   warfarin 1 MG tablet Commonly known as: COUMADIN Take 3-4 mg by mouth See admin instructions. Take 2 MG by mouth every Monday, Tuesday, 3 mg Wednesday, 2 mg Thursday and Friday evening and 3 MG by mouth every Saturday and Sunday evening        Allergies:  Allergies  Allergen Reactions   Escitalopram Oxalate Nausea Only   Oxycodone     vomiting    Family History: History reviewed. No pertinent family history.  Social History:  reports that he quit smoking about 47 years ago. His smoking use included cigarettes. He has never used smokeless tobacco. He reports that he does not drink alcohol and does not use drugs.  ROS: Pertinent ROS in HPI  Physical Exam: Blood pressure 93/60, pulse 81, height 5\' 6"  (1.676 m), weight 180 lb (81.6 kg).  Constitutional:  Well nourished. Alert and oriented, No acute distress. HEENT: [place] AT, mask in place.  Trachea midline Cardiovascular: No clubbing, cyanosis, or edema. Respiratory: Normal respiratory effort, no increased work of breathing. Neurologic: Grossly intact, no focal deficits, moving all 4 extremities. Psychiatric: Normal mood and affect.   Laboratory Data: Urinalysis Component     Latest Ref Rng & Units 09/18/2021  Specific Gravity, UA     1.005 - 1.030 1.015  pH, UA     5.0 - 7.5 8.5 (H)  Color, UA     Yellow Yellow  Appearance Ur     Clear Turbid (A)  Leukocytes,UA     Negative 3+ (A)  Protein,UA     Negative/Trace 2+ (A)  Glucose, UA     Negative Negative  Ketones, UA     Negative Negative  RBC, UA     Negative 1+ (A)  Bilirubin, UA     Negative Negative  Urobilinogen, Ur     0.2 - 1.0 mg/dL 1.0  Nitrite, UA     Negative Negative  Microscopic Examination      See below:   Component     Latest Ref Rng & Units  09/18/2021  WBC, UA     0 - 5 /hpf >30 (A)  RBC     0 - 2 /hpf 3-10 (A)  Epithelial Cells (non renal)     0 - 10 /hpf 0-10  Crystals     N/A Present (A)  Crystal Type     N/A Amorphous Sediment  Bacteria, UA     None seen/Few Many (A)  I have reviewed the labs.   Pertinent Imaging: N/A   Assessment & Plan:    1. Gross hematuria -resolved -UA 3-10 RBC's which is likely due to self cath and improved from the > 30 RBC's at  last visit -upper tract imaging with non-contrast CT and RUS this year - NED -cysto this year -NED  2. Urinary retention -secondary to BPH -continue finasteride 5 mg daily  -has an appointment on 12/20/2021 with Dr. Mali Michael Gridley for evaluation for a possible Rezum procedure    Return for Keep appointments with Dr. Oswaldo Milian and Dr. Bernardo Heater .  These notes generated with voice recognition software. I apologize for typographical errors.  Zara Council, PA-C  [facility] [address]  [place], [place] [address] [phone]

## 2021-09-18 ENCOUNTER — Encounter: Payer: Self-pay | Admitting: Urology

## 2021-09-18 ENCOUNTER — Ambulatory Visit (INDEPENDENT_AMBULATORY_CARE_PROVIDER_SITE_OTHER): Payer: HMO | Admitting: Urology

## 2021-09-18 ENCOUNTER — Other Ambulatory Visit: Payer: Self-pay

## 2021-09-18 VITALS — BP 93/60 | HR 81 | Ht 66.0 in | Wt 180.0 lb

## 2021-09-18 DIAGNOSIS — N138 Other obstructive and reflux uropathy: Secondary | ICD-10-CM | POA: Diagnosis not present

## 2021-09-18 DIAGNOSIS — R339 Retention of urine, unspecified: Secondary | ICD-10-CM

## 2021-09-18 DIAGNOSIS — N401 Enlarged prostate with lower urinary tract symptoms: Secondary | ICD-10-CM | POA: Diagnosis not present

## 2021-09-18 DIAGNOSIS — R310 Gross hematuria: Secondary | ICD-10-CM | POA: Diagnosis not present

## 2021-09-18 LAB — URINALYSIS, COMPLETE
Bilirubin, UA: NEGATIVE
Glucose, UA: NEGATIVE
Ketones, UA: NEGATIVE
Nitrite, UA: NEGATIVE
Specific Gravity, UA: 1.015 (ref 1.005–1.030)
Urobilinogen, Ur: 1 mg/dL (ref 0.2–1.0)
pH, UA: 8.5 — ABNORMAL HIGH (ref 5.0–7.5)

## 2021-09-18 LAB — MICROSCOPIC EXAMINATION: WBC, UA: >30 /hpf — AB (ref 0–5)

## 2021-10-01 ENCOUNTER — Telehealth: Payer: Self-pay

## 2021-10-01 ENCOUNTER — Other Ambulatory Visit: Payer: Self-pay

## 2021-10-01 ENCOUNTER — Encounter: Payer: Self-pay | Admitting: Physician Assistant

## 2021-10-01 ENCOUNTER — Ambulatory Visit: Payer: HMO | Admitting: Physician Assistant

## 2021-10-01 VITALS — BP 106/75 | HR 86 | Ht 68.0 in | Wt 173.0 lb

## 2021-10-01 DIAGNOSIS — N401 Enlarged prostate with lower urinary tract symptoms: Secondary | ICD-10-CM

## 2021-10-01 DIAGNOSIS — R310 Gross hematuria: Secondary | ICD-10-CM | POA: Diagnosis not present

## 2021-10-01 DIAGNOSIS — R300 Dysuria: Secondary | ICD-10-CM

## 2021-10-01 LAB — URINALYSIS, COMPLETE

## 2021-10-01 LAB — MICROSCOPIC EXAMINATION: RBC, Urine: >30 /hpf — AB (ref 0–2)

## 2021-10-01 LAB — BLADDER SCAN AMB NON-IMAGING

## 2021-10-01 MED ORDER — CEFUROXIME AXETIL 250 MG PO TABS: 250.0000 mg | ORAL_TABLET | Freq: Two times a day (BID) | ORAL | 0 refills | Status: AC

## 2021-10-01 NOTE — Patient Instructions (Addendum)
Start cefuroxime today to clear your UTI. Please call our office on Wednesday to let us know if your urine has cleared up any. If you develop fever, chills, nausea, vomiting, start passing thick ketchup-like urine, or start passing large blood clots, please call us immediately.

## 2021-10-01 NOTE — Telephone Encounter (Signed)
Pt was transferred to triage line, he states that he is having dysuria and sees significant blood in his urine. Pt added to scheduled for same day work in. Pt gave verbal understanding.

## 2021-10-02 NOTE — Progress Notes (Signed)
10/01/2021 2:34 PM   Adrian Chavez Nov 03, 1941 [id number]  CC: Chief Complaint  Patient presents with   Dysuria   Hematuria   HPI: Adrian Chavez is a 80 y.o. male with PMH BPH with urinary retention managed by [facility] awaiting appointment with Dr. Terrace Arabia in February 2023 to discuss Rezum who presents today for evaluation of gross hematuria and possible UTI.   Today he reports an approximate 5-day history of gross hematuria and dysuria.  He notes his urine has been thin and runny and he has not noticed any clot passage.  He has not had any dripping blood from the urethral meatus.  He denies fever, chills, nausea, or vomiting.  He continues to void spontaneously to some degree, last cath was yesterday evening.  In-office UA today positive for red color, unable to read due to pigment interference; urine microscopy with >30 RBCs/HPF, triple phosphate crystals, and many bacteria. PVR 312mL.  PMH: Past Medical History:  Diagnosis Date   Anxiety    CHF (congestive heart failure) ([place])    Claustrophobia    Colitis, ischemic ([place]) 2010   developed DVT   DVT (deep venous thrombosis) ([place]) 2010   stomach and leg   Dysrhythmia    A-Fib   Hypertension     Surgical History: Past Surgical History:  Procedure Laterality Date   HERNIA REPAIR Right 2007   IMPLANTABLE CARDIOVERTER DEFIBRILLATOR (ICD) GENERATOR CHANGE Left 02/21/2016   Procedure: ICD GENERATOR CHANGE;  Surgeon: Isaias Cowman, MD;  Location: [facility] ORS;  Service: Cardiovascular;  Laterality: Left;   PACEMAKER INSERTION     PITUITARY SURGERY  2002   Done at [facility] Medications:  Allergies as of 10/01/2021       Reactions   Escitalopram Oxalate Nausea Only   Oxycodone    vomiting        Medication List        Accurate as of October 01, 2021 11:59 PM. If you have any questions, ask your nurse or doctor.          albuterol 108 (90 Base) MCG/ACT inhaler Commonly known as: VENTOLIN HFA SMARTSIG:1-2  Inhalation Via Inhaler Every 4 Hours PRN   ALPRAZolam 0.5 MG tablet Commonly known as: XANAX Take 0.5 mg by mouth daily as needed for anxiety.   amiodarone 200 MG tablet Commonly known as: PACERONE Take 200 mg by mouth 2 (two) times daily.   azelastine 0.1 % nasal spray Commonly known as: ASTELIN Place into the nose.   carvedilol 3.125 MG tablet Commonly known as: COREG Take 3.125 mg by mouth 2 (two) times daily.   cefUROXime 250 MG tablet Commonly known as: CEFTIN Take 1 tablet (250 mg total) by mouth 2 (two) times daily with a meal for 7 days. Started by: Debroah Loop, PA-C   cetirizine 10 MG tablet Commonly known as: ZYRTEC Take 1 tablet by mouth daily.   Cialis 5 MG tablet Generic drug: tadalafil Take 5 mg by mouth daily.   Entresto 49-51 MG Generic drug: sacubitril-valsartan Take 1 tablet by mouth 2 (two) times daily.   finasteride 5 MG tablet Commonly known as: Proscar Take 1 tablet (5 mg total) by mouth daily.   fluticasone 50 MCG/ACT nasal spray Commonly known as: FLONASE Place 2 sprays into both nostrils daily as needed for allergies or rhinitis.   metolazone 5 MG tablet Commonly known as: ZAROXOLYN Take 5 mg by mouth daily.   potassium chloride SA 20 MEQ tablet Commonly  known as: KLOR-CON M Take 20 mEq by mouth 2 (two) times daily.   torsemide 20 MG tablet Commonly known as: DEMADEX Take 20 mg by mouth 2 (two) times daily.   vitamin B-12 1000 MCG tablet Commonly known as: CYANOCOBALAMIN Take 1,000 mcg by mouth daily.   warfarin 1 MG tablet Commonly known as: COUMADIN Take 3-4 mg by mouth See admin instructions. Take 2 MG by mouth every Monday, Tuesday, 3 mg Wednesday, 2 mg Thursday and Friday evening and 3 MG by mouth every Saturday and Sunday evening        Allergies:  Allergies  Allergen Reactions   Escitalopram Oxalate Nausea Only   Oxycodone     vomiting    Family History: No family history on file.  Social History:    reports that he quit smoking about 47 years ago. His smoking use included cigarettes. He has never used smokeless tobacco. He reports that he does not drink alcohol and does not use drugs.  Physical Exam: BP 106/75   Pulse 86   Ht 5\' 8"  (1.727 m)   Wt 173 lb (78.5 kg)   BMI 26.30 kg/m   Constitutional:  Alert and oriented, no acute distress, nontoxic appearing HEENT: [place], AT Cardiovascular: No clubbing, cyanosis, or edema Respiratory: Normal respiratory effort, no increased work of breathing Skin: No rashes, bruises or suspicious lesions Neurologic: Grossly intact, no focal deficits, moving all 4 extremities Psychiatric: Normal mood and affect  Laboratory Data: Results for orders placed or performed in visit on 10/01/21  Microscopic Examination   Urine  Result Value Ref Range   WBC, UA 0-5 0 - 5 /hpf   RBC >30 (A) 0 - 2 /hpf   Epithelial Cells (non renal) 0-10 0 - 10 /hpf   Crystals Present (A) N/A   Crystal Type Triple Phosphate N/A   Bacteria, UA Many (A) None seen/Few  Urinalysis, Complete  Result Value Ref Range   Specific Gravity, UA CANCELED    pH, UA CANCELED    Color, UA Red (A) Yellow   Appearance Ur Turbid (A) Clear   Protein,UA CANCELED    Glucose, UA CANCELED    Ketones, UA CANCELED    Microscopic Examination See below:   Bladder Scan (Post Void Residual) in office  Result Value Ref Range   Scan Result 323mL    Assessment & Plan:   1. Dysuria UA today notable for bacteriuria.  Will start empiric cefuroxime and send for culture for further evaluation.  PVR is elevated consistent with last [facility]IC yesterday evening. - Urinalysis, Complete - Bladder Scan (Post Void Residual) in office - CULTURE, URINE COMPREHENSIVE - cefUROXime (CEFTIN) 250 MG tablet; Take 1 tablet (250 mg total) by mouth 2 (two) times daily with a meal for 7 days.  Dispense: 14 tablet; Refill: 0  2. Gross hematuria I offered the patient Foley catheter placement with bladder irrigation today,  though my suspicion is low for retained clot given no clot passage and thin, runny quality of the urine.  He declined this.  I asked him to contact clinic via telephone in 2 days to let us know if his urine has cleared on antibiotics.  If not, may consider bringing him back in for Foley placement and irrigation at that time.  He expressed understanding.  Return if symptoms worsen or fail to improve.  Debroah Loop, PA-C  [facility] [address] [place], [place] [address] [phone]

## 2021-10-03 ENCOUNTER — Telehealth: Payer: Self-pay | Admitting: Physician Assistant

## 2021-10-03 NOTE — Telephone Encounter (Signed)
Per Debroah Loop, patient may follow up as needed while waiting for his appt at [facility] u[facility]. Patient voiced understanding.

## 2021-10-03 NOTE — Telephone Encounter (Signed)
Pt called to let Sam know he's feeling much better, urine is clear.  He wants to know if or when he needs a follow up.

## 2021-10-04 ENCOUNTER — Other Ambulatory Visit: Payer: Self-pay | Admitting: Urology

## 2021-10-07 LAB — CULTURE, URINE COMPREHENSIVE

## 2021-10-31 ENCOUNTER — Emergency Department: Payer: HMO

## 2021-10-31 ENCOUNTER — Other Ambulatory Visit: Payer: Self-pay

## 2021-10-31 ENCOUNTER — Inpatient Hospital Stay
Admission: EM | Admit: 2021-10-31 | Discharge: 2021-11-08 | DRG: 291 | Disposition: A | Discharge status: Home Health | Payer: HMO | Attending: Pulmonary Disease | Admitting: Pulmonary Disease

## 2021-10-31 ENCOUNTER — Inpatient Hospital Stay: Payer: HMO

## 2021-10-31 DIAGNOSIS — R000 Tachycardia, unspecified: Secondary | ICD-10-CM | POA: Diagnosis not present

## 2021-10-31 DIAGNOSIS — R570 Cardiogenic shock: Secondary | ICD-10-CM | POA: Diagnosis present

## 2021-10-31 DIAGNOSIS — I340 Nonrheumatic mitral (valve) insufficiency: Secondary | ICD-10-CM | POA: Diagnosis present

## 2021-10-31 DIAGNOSIS — N138 Other obstructive and reflux uropathy: Secondary | ICD-10-CM | POA: Diagnosis present

## 2021-10-31 DIAGNOSIS — I501 Left ventricular failure: Secondary | ICD-10-CM | POA: Diagnosis not present

## 2021-10-31 DIAGNOSIS — I480 Paroxysmal atrial fibrillation: Secondary | ICD-10-CM | POA: Diagnosis present

## 2021-10-31 DIAGNOSIS — I4821 Permanent atrial fibrillation: Secondary | ICD-10-CM | POA: Diagnosis present

## 2021-10-31 DIAGNOSIS — I272 Pulmonary hypertension, unspecified: Secondary | ICD-10-CM | POA: Diagnosis present

## 2021-10-31 DIAGNOSIS — Z8616 Personal history of COVID-19: Secondary | ICD-10-CM

## 2021-10-31 DIAGNOSIS — Z87891 Personal history of nicotine dependence: Secondary | ICD-10-CM

## 2021-10-31 DIAGNOSIS — G4733 Obstructive sleep apnea (adult) (pediatric): Secondary | ICD-10-CM | POA: Diagnosis present

## 2021-10-31 DIAGNOSIS — Z86718 Personal history of other venous thrombosis and embolism: Secondary | ICD-10-CM

## 2021-10-31 DIAGNOSIS — Z20822 Contact with and (suspected) exposure to covid-19: Secondary | ICD-10-CM | POA: Diagnosis present

## 2021-10-31 DIAGNOSIS — I5043 Acute on chronic combined systolic (congestive) and diastolic (congestive) heart failure: Secondary | ICD-10-CM | POA: Diagnosis present

## 2021-10-31 DIAGNOSIS — I10 Essential (primary) hypertension: Secondary | ICD-10-CM | POA: Diagnosis present

## 2021-10-31 DIAGNOSIS — J9622 Acute and chronic respiratory failure with hypercapnia: Secondary | ICD-10-CM | POA: Diagnosis present

## 2021-10-31 DIAGNOSIS — R4781 Slurred speech: Secondary | ICD-10-CM | POA: Diagnosis present

## 2021-10-31 DIAGNOSIS — Z66 Do not resuscitate: Secondary | ICD-10-CM | POA: Diagnosis present

## 2021-10-31 DIAGNOSIS — N1832 Chronic kidney disease, stage 3b: Secondary | ICD-10-CM | POA: Diagnosis present

## 2021-10-31 DIAGNOSIS — Z885 Allergy status to narcotic agent status: Secondary | ICD-10-CM

## 2021-10-31 DIAGNOSIS — Z95810 Presence of automatic (implantable) cardiac defibrillator: Secondary | ICD-10-CM

## 2021-10-31 DIAGNOSIS — N179 Acute kidney failure, unspecified: Secondary | ICD-10-CM | POA: Diagnosis present

## 2021-10-31 DIAGNOSIS — I080 Rheumatic disorders of both mitral and aortic valves: Secondary | ICD-10-CM | POA: Diagnosis present

## 2021-10-31 DIAGNOSIS — I130 Hypertensive heart and chronic kidney disease with heart failure and stage 1 through stage 4 chronic kidney disease, or unspecified chronic kidney disease: Secondary | ICD-10-CM | POA: Diagnosis present

## 2021-10-31 DIAGNOSIS — F419 Anxiety disorder, unspecified: Secondary | ICD-10-CM | POA: Diagnosis present

## 2021-10-31 DIAGNOSIS — Z888 Allergy status to other drugs, medicaments and biological substances status: Secondary | ICD-10-CM

## 2021-10-31 DIAGNOSIS — Z7901 Long term (current) use of anticoagulants: Secondary | ICD-10-CM

## 2021-10-31 DIAGNOSIS — R52 Pain, unspecified: Secondary | ICD-10-CM

## 2021-10-31 DIAGNOSIS — J189 Pneumonia, unspecified organism: Secondary | ICD-10-CM

## 2021-10-31 DIAGNOSIS — F40240 Claustrophobia: Secondary | ICD-10-CM | POA: Diagnosis present

## 2021-10-31 DIAGNOSIS — E876 Hypokalemia: Secondary | ICD-10-CM | POA: Diagnosis present

## 2021-10-31 DIAGNOSIS — Z79899 Other long term (current) drug therapy: Secondary | ICD-10-CM

## 2021-10-31 DIAGNOSIS — R609 Edema, unspecified: Secondary | ICD-10-CM

## 2021-10-31 DIAGNOSIS — E785 Hyperlipidemia, unspecified: Secondary | ICD-10-CM | POA: Diagnosis present

## 2021-10-31 DIAGNOSIS — I5031 Acute diastolic (congestive) heart failure: Secondary | ICD-10-CM | POA: Diagnosis not present

## 2021-10-31 DIAGNOSIS — I808 Phlebitis and thrombophlebitis of other sites: Secondary | ICD-10-CM | POA: Diagnosis present

## 2021-10-31 DIAGNOSIS — R109 Unspecified abdominal pain: Secondary | ICD-10-CM | POA: Diagnosis present

## 2021-10-31 DIAGNOSIS — N401 Enlarged prostate with lower urinary tract symptoms: Secondary | ICD-10-CM | POA: Diagnosis present

## 2021-10-31 DIAGNOSIS — I420 Dilated cardiomyopathy: Secondary | ICD-10-CM | POA: Diagnosis present

## 2021-10-31 DIAGNOSIS — J9621 Acute and chronic respiratory failure with hypoxia: Secondary | ICD-10-CM | POA: Diagnosis present

## 2021-10-31 DIAGNOSIS — I5042 Chronic combined systolic (congestive) and diastolic (congestive) heart failure: Secondary | ICD-10-CM | POA: Diagnosis present

## 2021-10-31 DIAGNOSIS — I5023 Acute on chronic systolic (congestive) heart failure: Secondary | ICD-10-CM

## 2021-10-31 DIAGNOSIS — I428 Other cardiomyopathies: Secondary | ICD-10-CM

## 2021-10-31 DIAGNOSIS — N189 Chronic kidney disease, unspecified: Secondary | ICD-10-CM | POA: Diagnosis present

## 2021-10-31 DIAGNOSIS — Z23 Encounter for immunization: Secondary | ICD-10-CM

## 2021-10-31 DIAGNOSIS — J9602 Acute respiratory failure with hypercapnia: Secondary | ICD-10-CM

## 2021-10-31 DIAGNOSIS — J9601 Acute respiratory failure with hypoxia: Secondary | ICD-10-CM

## 2021-10-31 DIAGNOSIS — R0602 Shortness of breath: Secondary | ICD-10-CM

## 2021-10-31 LAB — CBC WITH DIFFERENTIAL/PLATELET
Abs Immature Granulocytes: 0.05 10*3/uL (ref 0.00–0.07)
Basophils Absolute: 0.1 10*3/uL (ref 0.0–0.1)
Basophils Relative: 1 %
Eosinophils Absolute: 0.2 10*3/uL (ref 0.0–0.5)
Eosinophils Relative: 4 %
HCT: 48 % (ref 39.0–52.0)
Hemoglobin: 14.6 g/dL (ref 13.0–17.0)
Immature Granulocytes: 1 %
Lymphocytes Relative: 22 %
Lymphs Abs: 0.9 10*3/uL (ref 0.7–4.0)
MCH: 29.1 pg (ref 26.0–34.0)
MCHC: 30.4 g/dL (ref 30.0–36.0)
MCV: 95.8 fL (ref 80.0–100.0)
Monocytes Absolute: 0.7 10*3/uL (ref 0.1–1.0)
Monocytes Relative: 17 %
Neutro Abs: 2.1 10*3/uL (ref 1.7–7.7)
Neutrophils Relative %: 55 %
Platelets: 174 10*3/uL (ref 150–400)
RBC: 5.01 MIL/uL (ref 4.22–5.81)
RDW: 18.5 % — ABNORMAL HIGH (ref 11.5–15.5)
WBC: 3.9 10*3/uL — ABNORMAL LOW (ref 4.0–10.5)
nRBC: 0.5 % — ABNORMAL HIGH (ref 0.0–0.2)

## 2021-10-31 LAB — RESP PANEL BY RT-PCR (FLU A&B, COVID) ARPGX2
Influenza A by PCR: NEGATIVE
Influenza B by PCR: NEGATIVE
SARS Coronavirus 2 by RT PCR: NEGATIVE

## 2021-10-31 LAB — COMPREHENSIVE METABOLIC PANEL
ALT: 52 U/L — ABNORMAL HIGH (ref 0–44)
AST: 57 U/L — ABNORMAL HIGH (ref 15–41)
Albumin: 3.8 g/dL (ref 3.5–5.0)
Alkaline Phosphatase: 92 U/L (ref 38–126)
Anion gap: 10 (ref 5–15)
BUN: 56 mg/dL — ABNORMAL HIGH (ref 8–23)
CO2: 31 mmol/L (ref 22–32)
Calcium: 9.1 mg/dL (ref 8.9–10.3)
Chloride: 98 mmol/L (ref 98–111)
Creatinine, Ser: 2.93 mg/dL — ABNORMAL HIGH (ref 0.61–1.24)
GFR, Estimated: 21 mL/min — ABNORMAL LOW (ref >60)
Glucose, Bld: 121 mg/dL — ABNORMAL HIGH (ref 70–99)
Potassium: 3.2 mmol/L — ABNORMAL LOW (ref 3.5–5.1)
Sodium: 139 mmol/L (ref 135–145)
Total Bilirubin: 2.9 mg/dL — ABNORMAL HIGH (ref 0.3–1.2)
Total Protein: 6.6 g/dL (ref 6.5–8.1)

## 2021-10-31 LAB — PROCALCITONIN: Procalcitonin: <0.1 ng/mL

## 2021-10-31 LAB — BLOOD GAS, ARTERIAL
Acid-Base Excess: 3.5 mmol/L — ABNORMAL HIGH (ref 0.0–2.0)
Bicarbonate: 32.2 mmol/L — ABNORMAL HIGH (ref 20.0–28.0)
Expiratory PAP: 10
FIO2: 1
Inspiratory PAP: 16
O2 Saturation: 99.9 %
Patient temperature: 37
RATE: 10 resp/min
pCO2 arterial: 67 mmHg (ref 32.0–48.0)
pH, Arterial: 7.29 — ABNORMAL LOW (ref 7.350–7.450)
pO2, Arterial: 300 mmHg — ABNORMAL HIGH (ref 83.0–108.0)

## 2021-10-31 LAB — BRAIN NATRIURETIC PEPTIDE: B Natriuretic Peptide: >4500 pg/mL — ABNORMAL HIGH (ref 0.0–100.0)

## 2021-10-31 LAB — BLOOD GAS, VENOUS
Acid-Base Excess: 0.2 mmol/L (ref 0.0–2.0)
Bicarbonate: 28.7 mmol/L — ABNORMAL HIGH (ref 20.0–28.0)
FIO2: 1
O2 Saturation: 64.2 %
Patient temperature: 37
pCO2, Ven: 64 mmHg — ABNORMAL HIGH (ref 44.0–60.0)
pH, Ven: 7.26 (ref 7.250–7.430)
pO2, Ven: 39 mmHg (ref 32.0–45.0)

## 2021-10-31 LAB — TROPONIN I (HIGH SENSITIVITY): Troponin I (High Sensitivity): 48 ng/L — ABNORMAL HIGH (ref <18)

## 2021-10-31 LAB — APTT: aPTT: 45 seconds — ABNORMAL HIGH (ref 24–36)

## 2021-10-31 LAB — PROTIME-INR
INR: 3.9 — ABNORMAL HIGH (ref 0.8–1.2)
Prothrombin Time: 38.2 seconds — ABNORMAL HIGH (ref 11.4–15.2)

## 2021-10-31 LAB — LACTIC ACID, PLASMA: Lactic Acid, Venous: 1.7 mmol/L (ref 0.5–1.9)

## 2021-10-31 MED ORDER — LACTATED RINGERS IV SOLN: INTRAVENOUS | Status: DC

## 2021-10-31 MED ORDER — NOREPINEPHRINE 4 MG/250ML-% IV SOLN: 0.0000 ug/min | INTRAVENOUS | Status: DC

## 2021-10-31 MED ORDER — LACTATED RINGERS IV BOLUS: 500.0000 mL | Freq: Once | INTRAVENOUS | Status: DC

## 2021-10-31 MED ORDER — DOCUSATE SODIUM 100 MG PO CAPS
100.0000 mg | ORAL_CAPSULE | Freq: Two times a day (BID) | ORAL | Status: DC | PRN
  Administered 2021-11-04: 100 mg via ORAL
  Filled 2021-10-31: qty 1

## 2021-10-31 MED ORDER — IPRATROPIUM-ALBUTEROL 0.5-2.5 (3) MG/3ML IN SOLN
3.0000 mL | Freq: Once | RESPIRATORY_TRACT | Status: AC
  Administered 2021-10-31: 22:00:00 3 mL via RESPIRATORY_TRACT

## 2021-10-31 MED ORDER — POLYETHYLENE GLYCOL 3350 17 G PO PACK: 17.0000 g | PACK | Freq: Every day | ORAL | Status: DC | PRN

## 2021-10-31 MED ORDER — SODIUM CHLORIDE 0.9 % IV SOLN
2.0000 g | Freq: Once | INTRAVENOUS | Status: AC
  Administered 2021-10-31: 21:00:00 2 g via INTRAVENOUS
  Filled 2021-10-31: qty 2

## 2021-10-31 MED ORDER — SODIUM CHLORIDE 0.9 % IV BOLUS: 1000.0000 mL | Freq: Once | INTRAVENOUS | Status: DC

## 2021-10-31 MED ORDER — NOREPINEPHRINE 4 MG/250ML-% IV SOLN
INTRAVENOUS | Status: AC
  Filled 2021-10-31: qty 250

## 2021-10-31 MED ORDER — METRONIDAZOLE 500 MG/100ML IV SOLN
500.0000 mg | Freq: Once | INTRAVENOUS | Status: AC
  Administered 2021-10-31: 21:00:00 500 mg via INTRAVENOUS
  Filled 2021-10-31: qty 100

## 2021-10-31 MED ORDER — VANCOMYCIN HCL IN DEXTROSE 1-5 GM/200ML-% IV SOLN
1000.0000 mg | Freq: Once | INTRAVENOUS | Status: AC
  Administered 2021-10-31: 21:00:00 1000 mg via INTRAVENOUS
  Filled 2021-10-31: qty 200

## 2021-10-31 NOTE — ED Provider Notes (Signed)
[facility] Emergency Department Provider Note  ____________________________________________   Event Date/Time   First MD Initiated Contact with Patient 10/31/21 2005     (approximate)  I have reviewed the triage vital signs and the nursing notes.   HISTORY  Chief Complaint Fatigue (EMS called for lethargy, N/V/D x 1 week. Initial room air sats 70%, up to 90s on 15L NRB. Pt self caths and has hx of UTI. Pt was hypotensive at 90/70, given 1L en route. Pt alert and oriented with garbled speech on arrival. )    HPI Adrian Chavez is a 80 y.o. male  with h/o CHF (EF 20%), HTN, DVT, Afib here with SOB, weakness, n/v. History initially limited 2/2 WOB, slurred speech on arrival. Pt arrives with NRB in place. Per report, pt has been generally weak for several days, with n/v and now SOB. He has had intermittent slurred speech and had difficulty getting around the house today. He has a h/o recurrent UTIs 2/2 self cathing per EMS. No known sick contacts.  On my interview, pt has slurred speech somewhat limiting history. He denies any pain. He admits he ahs had n/v and poor appetite, and now SOB. No        Past Medical History:  Diagnosis Date   Anxiety    CHF (congestive heart failure) ([place])    Claustrophobia    Colitis, ischemic ([place]) 2010   developed DVT   DVT (deep venous thrombosis) ([place]) 2010   stomach and leg   Dysrhythmia    A-Fib   Hypertension     Patient Active Problem List   Diagnosis Date Noted   Acute on chronic respiratory failure with hypoxia and hypercapnia ([place]) 10/31/2021   Acute renal failure (ARF) ([place]) 03/06/2021   Acute urinary retention 03/05/2021   NICM (nonischemic cardiomyopathy) ([place]) 03/05/2021   AKI (acute kidney injury) ([place]) 03/05/2021   Chronic anticoagulation 03/05/2021   History of 2019 novel coronavirus disease (COVID-19) 19/62/2297   Chronic systolic CHF (congestive heart failure), NYHA class 2 ([place]) 02/26/2018    Essential hypertension 05/17/2014   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 Authorizing Provider  albuterol (VENTOLIN HFA) 108 (90 Base) MCG/ACT inhaler SMARTSIG:1-2 Inhalation Via Inhaler Every 4 Hours PRN 12/25/20   [provider]  ALPRAZolam Duanne Moron) 0.5 MG tablet Take 0.5 mg by mouth daily as needed for anxiety.     [provider]  amiodarone (PACERONE) 200 MG tablet Take 200 mg by mouth 2 (two) times daily. 09/12/21   [provider]  azelastine (ASTELIN) 0.1 % nasal spray Place into the nose. 03/04/20   [provider]  carvedilol (COREG) 3.125 MG tablet Take 3.125 mg by mouth 2 (two) times daily.    [provider]  cetirizine (ZYRTEC) 10 MG tablet Take 1 tablet by mouth daily. 06/21/20   [provider]  CIALIS 5 MG tablet Take 5 mg by mouth daily.  [provider]  ENTRESTO 49-51 MG Take 1 tablet by mouth 2 (two) times daily. 03/26/21   [provider]  finasteride (PROSCAR) 5 MG tablet Take 1 tablet (5 mg total) by mouth daily. 03/27/21   Zara Council A, PA-C  fluticasone (FLONASE) 50 MCG/ACT nasal spray Place 2 sprays into both nostrils daily as needed for allergies or rhinitis.    [provider]  metolazone (ZAROXOLYN) 5 MG tablet Take 5 mg by mouth daily. 09/03/21   [provider]  potassium chloride SA  (K-DUR,KLOR-CON) 20 MEQ tablet Take 20 mEq by mouth 2 (two) times daily.    [provider]  torsemide (DEMADEX) 20 MG tablet Take 20 mg by mouth 2 (two) times daily.    [provider]  vitamin B-12 (CYANOCOBALAMIN) 1000 MCG tablet Take 1,000 mcg by mouth daily.    [provider]  warfarin (COUMADIN) 1 MG tablet Take 3-4 mg by mouth See admin instructions. Take 2 MG by mouth every Monday, Tuesday, 3 mg Wednesday, 2 mg Thursday and Friday evening and 3 MG by mouth every Saturday and Sunday evening    [provider]    Allergies Escitalopram oxalate and Oxycodone  History reviewed. No pertinent family history.  Social History Social History   Tobacco Use   Smoking status: Former    Types: Cigarettes    Quit date: 02/13/1974    Years since quitting: 47.7   Smokeless tobacco: Never  Substance Use Topics   Alcohol use: No   Drug use: No    Review of Systems  Review of Systems  Constitutional:  Positive for fatigue. Negative for chills and fever.  HENT:  Negative for sore throat.   Respiratory:  Positive for cough and shortness of breath.   Cardiovascular:  Negative for chest pain.  Gastrointestinal:  Positive for nausea and vomiting. Negative for abdominal pain.  Genitourinary:  Negative for flank pain.  Musculoskeletal:  Negative for neck pain.  Skin:  Negative for rash and wound.  Allergic/Immunologic: Negative for immunocompromised state.  Neurological:  Positive for weakness. Negative for numbness.  Hematological:  Does not bruise/bleed easily.  All other systems reviewed and are negative.   ____________________________________________  PHYSICAL EXAM:      VITAL SIGNS: ED Triage Vitals  Enc Vitals Group     BP 10/31/21 2005 98/85     Pulse Rate 10/31/21 2005 80     Resp 10/31/21 2005 19     Temp 10/31/21 2005 (!) 96.9 F (36.1 C)     Temp Source 10/31/21 2005 Axillary     SpO2 10/31/21 2004 90 %     Weight 10/31/21 2007 177 lb  8 oz (80.5 kg)     Height 10/31/21 2007 5\' 8"  (1.727 m)     Head Circumference --      Peak Flow --      Pain Score 10/31/21 2007 0     Pain Loc --      Pain Edu? --      Excl. in [place]? --      Physical Exam Vitals and nursing note reviewed.  Constitutional:      General: He is not in acute distress.    Appearance: He is well-developed. He is ill-appearing.  HENT:     Head: Normocephalic and atraumatic.  Eyes:     Conjunctiva/sclera: Conjunctivae normal.  Cardiovascular:     Rate and Rhythm: Rhythm irregular.     Heart sounds: Normal heart sounds.  Pulmonary:     Effort: Tachypnea, accessory muscle usage and respiratory distress present.     Breath sounds: Examination of the right-lower field reveals rales. Examination of the left-lower field reveals rales. Decreased breath sounds and rales present. No wheezing.  Abdominal:     General: There is no distension.  Musculoskeletal:     Cervical back: Neck supple.  Skin:    General: Skin is warm.     Capillary Refill: Capillary refill takes less than 2 seconds.     Findings: No rash.  Neurological:     Mental Status: He is alert and oriented to person, place, and time.     Motor: No abnormal muscle tone.      ____________________________________________   LABS (all labs ordered are listed, but only abnormal results are displayed)  Labs Reviewed  COMPREHENSIVE METABOLIC PANEL - Abnormal; Notable for the following components:      Result Value   Potassium 3.2 (*)    Glucose, Bld 121 (*)    BUN 56 (*)    Creatinine, Ser 2.93 (*)    AST 57 (*)    ALT 52 (*)    Total Bilirubin 2.9 (*)    GFR, Estimated 21 (*)    All other components within normal limits  CBC WITH DIFFERENTIAL/PLATELET - Abnormal; Notable for the following components:   WBC 3.9 (*)    RDW 18.5 (*)    nRBC 0.5 (*)    All other components within normal limits  BLOOD GAS, VENOUS - Abnormal; Notable for the following components:   pCO2, Ven 64 (*)     Bicarbonate 28.7 (*)    All other components within normal limits  PROTIME-INR - Abnormal; Notable for the following components:   Prothrombin Time 38.2 (*)    INR 3.9 (*)    All other components within normal limits  APTT - Abnormal; Notable for the following components:   aPTT 45 (*)    All other components within normal limits  BRAIN NATRIURETIC PEPTIDE - Abnormal; Notable for the following components:   B Natriuretic Peptide >4,500.0 (*)    All other components within normal limits  BLOOD GAS, ARTERIAL - Abnormal; Notable for the following components:   pH, Arterial 7.29 (*)    pCO2 arterial 67 (*)    pO2, Arterial 300 (*)    Bicarbonate 32.2 (*)    Acid-Base Excess 3.5 (*)    All other components within normal limits  D-DIMER, QUANTITATIVE - Abnormal; Notable for the following components:   D-Dimer, Quant 1.83 (*)    All other components within normal limits  TROPONIN I (HIGH SENSITIVITY) - Abnormal; Notable for the following components:   Troponin I (High Sensitivity) 48 (*)    All other components within normal limits  RESP PANEL BY RT-PCR (FLU A&B, COVID) ARPGX2  CULTURE, BLOOD (ROUTINE X 2)  CULTURE, BLOOD (ROUTINE X 2)  URINE CULTURE  LACTIC ACID, PLASMA  LACTIC ACID, PLASMA  PROCALCITONIN  URINALYSIS, COMPLETE (UACMP) WITH MICROSCOPIC  CORTISOL  STREP PNEUMONIAE URINARY ANTIGEN  PHOSPHORUS  MAGNESIUM  BLOOD GAS, ARTERIAL  BASIC METABOLIC PANEL  CBC  PROTIME-INR    ____________________________________________  EKG: V-paced rhythm, VR 60. No sinus beats noted. No apparent ST elevaitons. ________________________________________  RADIOLOGY All imaging, including plain films, CT scans, and ultrasounds, independently reviewed by me, and interpretations confirmed via formal radiology reads.  ED MD interpretation:   CXR: CHF, R Basilar atelectasis vs PNA  Official radiology report(s): DG Abd  1 View  Result Date: 10/31/2021 CLINICAL DATA:  Abdominal pain.  EXAM: ABDOMEN - 1 VIEW COMPARISON:  October 27, 2010 FINDINGS: Mild atelectatic changes are seen within the bilateral lung bases. The cardiac silhouette is markedly enlarged. The bowel gas pattern is normal. Multiple small radiopaque surgical coils are seen overlying the lower pelvis on the right. No radio-opaque calculi or other significant radiographic abnormality are seen. IMPRESSION: 1. Normal bowel gas pattern without evidence of renal calculi. 2. Mild bibasilar atelectasis. Electronically Signed   By: Virgina Norfolk M.D.   On: 10/31/2021 23:39   DG Chest Port 1 View  Result Date: 10/31/2021 CLINICAL DATA:  Questionable sepsis.  Evaluate for abnormality. EXAM: PORTABLE CHEST 1 VIEW COMPARISON:  03/05/2021 FINDINGS: Multi lead ICD is identified with battery pack in the left chest wall. Marked cardiac enlargement is unchanged. There is blunting of the left costophrenic angle, new from previous exam. This may represent a small effusion. Diffuse pulmonary vascular congestion. No frank edema or airspace consolidation. Platelike atelectasis is noted in the right lung base. IMPRESSION: 1. Cardiac enlargement, small left pleural effusion and pulmonary vascular congestion. Correlate for any signs or symptoms of CHF. 2. Right base atelectasis. Electronically Signed   By: Kerby Moors M.D.   On: 10/31/2021 20:26    ____________________________________________  PROCEDURES   Procedure(s) performed (including Critical Care):  .Critical Care Performed by: Duffy Bruce, MD Authorized by: Duffy Bruce, MD   Critical care provider statement:    Critical care time (minutes):  30   Critical care was necessary to treat or prevent imminent or life-threatening deterioration of the following conditions:  Cardiac failure, circulatory failure, respiratory failure and sepsis   Critical care was time spent personally by me on the following activities:  Development of treatment plan with patient or  surrogate, discussions with consultants, evaluation of patient's response to treatment, examination of patient, ordering and review of laboratory studies, ordering and review of radiographic studies, ordering and performing treatments and interventions, pulse oximetry, re-evaluation of patient's condition and review of old charts  ____________________________________________  INITIAL IMPRESSION / MDM / [place] / ED COURSE  As part of my medical decision making, I reviewed the following data within the [place] notes reviewed and incorporated, Old chart reviewed, Notes from prior ED visits, and [place] Controlled Substance Database       *Adrian Chavez was evaluated in Emergency Department on 11/01/2021 for the symptoms described in the history of present illness. He was evaluated in the context of the global COVID-19 pandemic, which necessitated consideration that the patient might be at risk for infection with the SARS-CoV-2 virus that causes COVID-19. Institutional protocols and algorithms that pertain to the evaluation of patients at risk for COVID-19 are in a state of rapid change based on information released by regulatory bodies including the CDC and federal and state organizations. These policies and algorithms were followed during the patient's care in the ED.  Some ED evaluations and interventions may be delayed as a result of limited staffing during the pandemic.*     Medical Decision Making:  80 yo M with PMHx as above here with generalized weakness, SOB, and hypoxia. On arrival, pt in obvious resp distress, tachypneic, with shallow respirations. He was placed on BIPAP with moderate improvement in WOB. Lab work shows AKI, likely mild congestive hepatopathy, but overall fairly unremarkable labs. LA normal. EKG nonischemic (paced rhythm which is baseline). COVID, flu negative. [place] pending. Pt was  given 1-1.5L with EMS - further fluids held in ED 2/2 his  markedly poor EF per review of records.  Initial concern for PNA vs CHF exacerbation. Broad-spectrum ABX started. Fluids held but will hold on diuresis 2/2 possible PNA as well as borderline hypotension. Will start levo prn. Had a long discussion with pt's wife and son at bedside. Pt has DNR and would ultimately not desire intubation, which I think is reasonable given his comorbidities. Will continue medical treatment, admit to ICU.   ____________________________________________  FINAL CLINICAL IMPRESSION(S) / ED DIAGNOSES  Final diagnoses:  Acute on chronic systolic congestive heart failure ([place])  Community acquired pneumonia of right lower lobe of lung  AKI (acute kidney injury) ([place])  Acute respiratory failure with hypoxia and hypercapnia ([place])     MEDICATIONS GIVEN DURING THIS VISIT:  Medications  sodium chloride 0.9 % bolus 1,000 mL (0 mLs Intravenous Hold 10/31/21 2042)  lactated ringers bolus 500 mL (0 mLs Intravenous Hold 10/31/21 2042)  norepinephrine (LEVOPHED) 4mg  in 250mL (0.016 mg/mL) premix infusion (0 mcg/min Intravenous Hold 10/31/21 2043)  norepinephrine (LEVOPHED) 4-5 MG/250ML-% infusion SOLN (0 mcg/kg/min  Hold 10/31/21 2053)  docusate sodium (COLACE) capsule 100 mg (has no administration in time range)  polyethylene glycol (MIRALAX / GLYCOLAX) packet 17 g (has no administration in time range)  budesonide (PULMICORT) nebulizer solution 0.25 mg (has no administration in time range)  ipratropium-albuterol (DUONEB) 0.5-2.5 (3) MG/3ML nebulizer solution 3 mL (has no administration in time range)  Warfarin - Pharmacist Dosing Inpatient (has no administration in time range)  ceFEPIme (MAXIPIME) 2 g in sodium chloride 0.9 % 100 mL IVPB (0 g Intravenous Stopped 10/31/21 2111)  metroNIDAZOLE (FLAGYL) IVPB 500 mg (0 mg Intravenous Stopped 10/31/21 2141)  vancomycin (VANCOCIN) IVPB 1000 mg/200 mL premix (0 mg Intravenous Stopped 10/31/21 2141)  ipratropium-albuterol (DUONEB)  0.5-2.5 (3) MG/3ML nebulizer solution 3 mL (3 mLs Nebulization Given 10/31/21 2141)     ED Discharge Orders     None        Note:  This document was prepared using Dragon voice recognition software and may include unintentional dictation errors.   Duffy Bruce, MD 11/01/21 [phone]

## 2021-10-31 NOTE — Consult Note (Signed)
CODE SEPSIS - PHARMACY COMMUNICATION  **Broad Spectrum Antibiotics should be administered within 1 hour of Sepsis diagnosis**  Time Code Sepsis Called/Page Received: 2008  Antibiotics Ordered: 2015  Time of 1st antibiotic administration: 2041  Additional action taken by pharmacy: N/A  If necessary, Name of Provider/Nurse Contacted: N/A  Lorna Dibble ,PharmD Clinical Pharmacist  10/31/2021  8:10 PM

## 2021-10-31 NOTE — ED Notes (Signed)
Report given to ICU Cecilio Asper, RN)

## 2021-10-31 NOTE — Consult Note (Signed)
PHARMACY -  BRIEF ANTIBIOTIC NOTE   Pharmacy has received consult(s) for Vancomycin & Cefepime from an ED provider.  The patient's profile has been reviewed for ht/wt/allergies/indication/available labs.    One time order(s) placed for: Vancomycin 1g IV x1 in ED. Cefepime 2g IV x1 in ED Also, receiving flagyl 500mg  IV x1 in ED.  Further antibiotics/pharmacy consults should be ordered by admitting physician if indicated.                       Thank you, Lorna Dibble 10/31/2021  8:09 PM

## 2021-10-31 NOTE — H&P (Addendum)
NAME:  Adrian Chavez, MRN:  [id number], DOB:  10/20/1941, LOS: 0 ADMISSION DATE:  10/31/2021, CONSULTATION DATE:  10/31/2021 REFERRING MD:  Duffy Bruce MD  CHIEF COMPLAINT: Shortness of Breath   HPI  80 y.o male  with significant PMH of DVT, Ischemic Colitis, NICM (nonischemic cardiomyopathy), AKI (acute kidney injury), Chronic anticoagulation, History of 2019 novel coronavirus disease ([id number]), Chronic systolic CHF (congestive heart failure), NYHA class 2, Essential hypertension, ICD (implantable cardioverter-defibrillator) in place, Atrial fibrillation, Benign prostatic hyperplasia with lower urinary tract symptoms who presented to the ED with chief complaints of progressive shortness of breath.  Per family members at the bedside, EMS was called for complaints of progressive SOB. The family members reported that the patient has been more weak for a week due to nausea, vomiting, and diarrhea. No reports of fever, chills, chest pain, abdominal pain, or cough. On EMS arrival, patient was already on NRB, he was hypotensive, but alert and oriented. After IV access was obtained, responders began administering fluid and placing the patient on the stair chair. Enroute the patient  received IV fluids. The patient remained alert and oriented, but became lethargic upon arrival to the hospital.   ED Course: In the emergency department, the temperature was 36.1C, the heart rate 80 beats/minute, the blood pressure 98/85  mm Hg, the respiratory rate 19 breaths/minute, and the oxygen saturation 96% on 15% NRB. He was lethargic, and responded with one-word answers; symmetric movement in the arms and legs was observed. An electrocardiogram showed ventricular paced rhythm with rate 60 and prolonged Qtc.   Pertinent Labs in Red/Diagnostics Findings: Na+/ K+: 139/3.2 Glucose: 121 BUN/Cr.: 56/2.93 AST/ALT: 57/52  WBC: 3.9 PCT: <0.10 Lactic acid: 1.2 COVID PCR: Negative   Troponin: 48 BNP:>4500 Arterial  Blood Gas result:  pO2 300; pCO2 67; pH 7.29;  HCO3 32.2, %O2 Sat 99.9.  Patient was placed on BiPAP due to VBG finding concerning for worsening hypoxia and hypercapnea. Patient received additional 500 cc IV fluids bolus and started on broad spectrum antibiotics for suspected infectious process. Due to high risk for decompensation and repeat abnormal ABG findings, PCCM ask to admit to ICU for further management.  Past Medical History   DVT   Ischemic Colitis 03/05/2021  NICM (nonischemic cardiomyopathy) ([place]) 03/05/2021  AKI (acute kidney injury) ([place]) 03/05/2021  Chronic anticoagulation 03/05/2021  History of 2019 novel coronavirus disease (COVID-19) 98/33/8250  Chronic systolic CHF (congestive heart failure), NYHA class 2 ([place]) 02/26/2018  Essential hypertension 05/17/2014  ICD (implantable cardioverter-defibrillator) in place 05/17/2014  Atrial fibrillation ([place]) 03/03/2014  Benign prostatic hyperplasia with lower urinary tract symptoms 07/10/2012   Significant Hospital Events   10/31/21> Admitted to ICU with acute hypoxic hypercapnic respiratory failure  Consults:  Cardiology  Procedures:  None  Significant Diagnostic Tests:  12/28: Chest Xray>Cardiac enlargement, small left pleural effusion and pulmonary vascular congestion. Correlate for any signs or symptoms of CHF. 2. Right base atelectasis. 12/28: Abdominal xray>  Micro Data:  12/28: SARS-CoV-2 PCR> negative 12/28: Influenza PCR> negative 12/28: Blood culture x2> 12/28: Urine Culture> 12/28: MRSA PCR>>  12/28: Strep pneumo urinary antigen> 12/28: Legionella urinary antigen>  Antimicrobials:  Vancomycin 12/28 x 1 Cefepime 12/28 x 1 Metronidazole 12/28 x 1   OBJECTIVE  Blood pressure 95/79, pulse 60, temperature (!) 96.9 F (36.1 C), temperature source Axillary, resp. rate 18, height 5\' 8"  (1.727 m), weight 80.5 kg, SpO2 94 %.        Intake/Output Summary (Last 24 hours) at 10/31/2021 2245  Last data filed at  10/31/2021 2141 Gross per 24 hour  Intake 1390.64 ml  Output --  Net 1390.64 ml   Filed Weights   10/31/21 2007  Weight: 80.5 kg   Physical Examination  GENERAL: 80 year-old critically ill patient lying in the bed on BIPAP EYES: Pupils equal, round, reactive to light and accommodation. No scleral icterus. Extraocular muscles intact.  HEENT: Head atraumatic, normocephalic. Oropharynx and nasopharynx clear.  NECK:  Supple, no jugular venous distention. No thyroid enlargement, no tenderness.  LUNGS: Decreased breath sounds bilaterally, no wheezing, mild rales and rhonchi. No use of accessory muscles of respiration.  CARDIOVASCULAR: S1, S2 normal. No murmurs, rubs, or gallops.  ABDOMEN: Soft, nontender, nondistended. Bowel sounds present. No organomegaly or mass.  EXTREMITIES: Mild +1 pitting edema with cyanosis, No clubbing.  NEUROLOGIC: Cranial nerves II through XII are intact.  Moves all extremities. Sensation intact. Gait not checked.  PSYCHIATRIC: The patient is on BIPAP SKIN: No obvious rash, lesion, or ulcer.   Labs/imaging that I havepersonally reviewed  (right click and "Reselect all SmartList Selections" daily)  I, personally viewed and interpreted this ECG. EKG Interpretation Date: 10/31/2021 V.paced. rate 60 BPM PR interval * ms QRS duration 176 ms QT/QTcB 554/554 ms P-R-T axes * -87 90   Labs   CBC: Recent Labs  Lab 10/31/21 2008  WBC 3.9*  NEUTROABS 2.1  HGB 14.6  HCT 48.0  MCV 95.8  PLT 527    Basic Metabolic Panel: Recent Labs  Lab 10/31/21 2008  NA 139  K 3.2*  CL 98  CO2 31  GLUCOSE 121*  BUN 56*  CREATININE 2.93*  CALCIUM 9.1   GFR: Estimated Creatinine Clearance: 20.1 mL/min (A) (by C-G formula based on SCr of 2.93 mg/dL (H)). Recent Labs  Lab 10/31/21 2008  WBC 3.9*  LATICACIDVEN 1.7    Liver Function Tests: Recent Labs  Lab 10/31/21 2008  AST 57*  ALT 52*  ALKPHOS 92  BILITOT 2.9*  PROT 6.6  ALBUMIN 3.8   No results for  input(s): LIPASE, AMYLASE in the last 168 hours. No results for input(s): AMMONIA in the last 168 hours.  ABG    Component Value Date/Time   PHART 7.29 (L) 10/31/2021 2211   PCO2ART 67 (HH) 10/31/2021 2211   PO2ART 300 (H) 10/31/2021 2211   HCO3 32.2 (H) 10/31/2021 2211   O2SAT 99.9 10/31/2021 2211     Coagulation Profile: Recent Labs  Lab 10/31/21 2146  INR 3.9*    Cardiac Enzymes: No results for input(s): CKTOTAL, CKMB, CKMBINDEX, TROPONINI in the last 168 hours.  HbA1C: No results found for: HGBA1C  CBG: No results for input(s): GLUCAP in the last 168 hours.  Review of Systems:   Patient on BIPAP unable to obtain  Past Medical History  He,  has a past medical history of Anxiety, CHF (congestive heart failure) ([place]), Claustrophobia, Colitis, ischemic ([place]) (2010), DVT (deep venous thrombosis) ([place]) (2010), Dysrhythmia, and Hypertension.   Surgical History    Past Surgical History:  Procedure Laterality Date   HERNIA REPAIR Right 2007   IMPLANTABLE CARDIOVERTER DEFIBRILLATOR (ICD) GENERATOR CHANGE Left 02/21/2016   Procedure: ICD GENERATOR CHANGE;  Surgeon: Isaias Cowman, MD;  Location: [facility] ORS;  Service: Cardiovascular;  Laterality: Left;   PACEMAKER INSERTION     PITUITARY SURGERY  2002   Done at [place] History   reports that he quit smoking about 47 years ago. His smoking use included cigarettes. He  has never used smokeless tobacco. He reports that he does not drink alcohol and does not use drugs.   Family History   His family history is not on file.   Allergies Allergies  Allergen Reactions   Escitalopram Oxalate Nausea Only   Oxycodone     vomiting     Home Medications  Prior to Admission medications   Medication Sig Start Date End Date Taking? Authorizing Provider  albuterol (VENTOLIN HFA) 108 (90 Base) MCG/ACT inhaler SMARTSIG:1-2 Inhalation Via Inhaler Every 4 Hours PRN 12/25/20   [provider]  ALPRAZolam Duanne Moron) 0.5  MG tablet Take 0.5 mg by mouth daily as needed for anxiety.     [provider]  amiodarone (PACERONE) 200 MG tablet Take 200 mg by mouth 2 (two) times daily. 09/12/21   [provider]  azelastine (ASTELIN) 0.1 % nasal spray Place into the nose. 03/04/20   [provider]  carvedilol (COREG) 3.125 MG tablet Take 3.125 mg by mouth 2 (two) times daily.    [provider]  cetirizine (ZYRTEC) 10 MG tablet Take 1 tablet by mouth daily. 06/21/20   [provider]  CIALIS 5 MG tablet Take 5 mg by mouth daily.     [provider]  ENTRESTO 49-51 MG Take 1 tablet by mouth 2 (two) times daily. 03/26/21   [provider]  finasteride (PROSCAR) 5 MG tablet Take 1 tablet (5 mg total) by mouth daily. 03/27/21   Zara Council A, PA-C  fluticasone (FLONASE) 50 MCG/ACT nasal spray Place 2 sprays into both nostrils daily as needed for allergies or rhinitis.    [provider]  metolazone (ZAROXOLYN) 5 MG tablet Take 5 mg by mouth daily. 09/03/21   [provider]  potassium chloride SA (K-DUR,KLOR-CON) 20 MEQ tablet Take 20 mEq by mouth 2 (two) times daily.    [provider]  torsemide (DEMADEX) 20 MG tablet Take 20 mg by mouth 2 (two) times daily.    [provider]  vitamin B-12 (CYANOCOBALAMIN) 1000 MCG tablet Take 1,000 mcg by mouth daily.    [provider]  warfarin (COUMADIN) 1 MG tablet Take 3-4 mg by mouth See admin instructions. Take 2 MG by mouth every Monday, Tuesday, 3 mg Wednesday, 2 mg Thursday and Friday evening and 3 MG by mouth every Saturday and Sunday evening    [provider]  Scheduled Meds: Continuous Infusions:  lactated ringers Stopped (10/31/21 2042)   norepinephrine Stopped (10/31/21 2053)   norepinephrine (LEVOPHED) Adult infusion Stopped (10/31/21 2043)   sodium chloride Stopped (10/31/21 2042)   PRN Meds:.docusate sodium, polyethylene glycol  Assessment & Plan:    Acute Hypoxic Hypercapnic Respiratory Failure secondary to Pulmonary Edema and probabale underlying Pneumonia -Supplemental O2 as needed to maintain O2 saturations 88 to 92% -BiPAP, wean as tolerated -High risk for intubation however patient would not wish to be placed on the vent -Follow intermittent ABG and chest x-ray as needed -IV Lasix as blood pressure and renal function permits; currently on Lasix 20 mg IV BID -F/u cultures, trend PCT/WBC -Start on Ceftriaxone and Doxy due to prolonged QTc -Budesonide inhaler/nebs BID, bronchodilators PRN   Acute on Chronic Systolic CHF (Last known LVEF 15-20%) Cardiogenic Shock Known JQ:ZESP, HTN, HLD, OSA, PE -Significantly volume overloaded with BNP >4500 -Continuous cardiac monitoring -Vasopressors as needed to  keep MAP goal -Echo pending -Daily weights -Strict I's and O's -Trend High-sensitivity troponin  -Diuresis as BP and renal function permits  Permanent Atrial Fibrillation -He remains rate controlled and vpaced -Hold Amiodarone for now, Qtc prolonged  and unable to tolerate po -CHADS2VASc  -Patient on Coumadin at home, PT/INR supra-therapeutic, hold anticoagulation. pharmacy consult for management.  Mildly Elevated LFTs~ suspect due to Hepatic congestion with CHF -Follow LFT's and bleeding times -Advance diet as tolerated -PPI  AKI on CKD Hypokalemia -Monitor I&O's / urinary output -Follow BMP -Ensure adequate renal perfusion -Avoid nephrotoxic agents as able -Replace electrolytes as indicated -Consider Nephrology consult  Best practice:  Diet:  NPO Pain/Anxiety/Delirium protocol (if indicated): No VAP protocol (if indicated): Not indicated DVT prophylaxis: Contraindicated GI prophylaxis: PPI Glucose control:  SSI No Central venous access:  N/A Arterial line:  N/A Foley:  Yes, and it is still needed Mobility:  bed rest  PT consulted: N/A Last date of multidisciplinary goals of care discussion [12/28] Code  Status:  DNR Disposition: ICU   = Goals of Care = Code Status Order: DNR  Primary Emergency Contact: Hascall,Linda S, Home Phone: [phone] Discussed with patient's family (wife and son) at the bedside/ They would wishes to pursue ongoing treatment, but concurred that if deteriorated to pulselessness, patient would prefer a natural death as opposed to invasive measures such as CPR and intubation.  Family should be immediately contacted in such situations.  Critical care time: 45 minutes     Rufina Falco, DNP, CCRN, FNP-C, AGACNP-BC Acute Care Nurse Practitioner  [facility] Pager: [phone] [place] [facility]  .

## 2021-11-01 ENCOUNTER — Encounter: Payer: Self-pay | Admitting: Pulmonary Disease

## 2021-11-01 ENCOUNTER — Inpatient Hospital Stay: Payer: HMO

## 2021-11-01 ENCOUNTER — Inpatient Hospital Stay
Admit: 2021-11-01 | Discharge: 2021-11-01 | Disposition: A | Discharge status: Home / Self Care | Payer: HMO | Attending: Nurse Practitioner | Admitting: Nurse Practitioner

## 2021-11-01 DIAGNOSIS — J9622 Acute and chronic respiratory failure with hypercapnia: Secondary | ICD-10-CM

## 2021-11-01 DIAGNOSIS — N179 Acute kidney failure, unspecified: Secondary | ICD-10-CM

## 2021-11-01 DIAGNOSIS — R000 Tachycardia, unspecified: Secondary | ICD-10-CM

## 2021-11-01 DIAGNOSIS — I501 Left ventricular failure: Secondary | ICD-10-CM

## 2021-11-01 DIAGNOSIS — I5031 Acute diastolic (congestive) heart failure: Secondary | ICD-10-CM

## 2021-11-01 DIAGNOSIS — J9621 Acute and chronic respiratory failure with hypoxia: Secondary | ICD-10-CM

## 2021-11-01 LAB — URINALYSIS, COMPLETE (UACMP) WITH MICROSCOPIC
Bilirubin Urine: NEGATIVE
Glucose, UA: NEGATIVE mg/dL
Ketones, ur: NEGATIVE mg/dL
Nitrite: NEGATIVE
Protein, ur: 100 mg/dL — AB
Specific Gravity, Urine: 1.012 (ref 1.005–1.030)
Squamous Epithelial / HPF: NONE SEEN (ref 0–5)
WBC, UA: >50 WBC/hpf — ABNORMAL HIGH (ref 0–5)
pH: 6 (ref 5.0–8.0)

## 2021-11-01 LAB — MAGNESIUM: Magnesium: 2.3 mg/dL (ref 1.7–2.4)

## 2021-11-01 LAB — ELECTROLYTE PANEL
Anion gap: 12 (ref 5–15)
CO2: 28 mmol/L (ref 22–32)
Chloride: 101 mmol/L (ref 98–111)
Potassium: 3 mmol/L — ABNORMAL LOW (ref 3.5–5.1)
Sodium: 141 mmol/L (ref 135–145)

## 2021-11-01 LAB — BASIC METABOLIC PANEL
Anion gap: 8 (ref 5–15)
BUN: 54 mg/dL — ABNORMAL HIGH (ref 8–23)
CO2: 31 mmol/L (ref 22–32)
Calcium: 8.7 mg/dL — ABNORMAL LOW (ref 8.9–10.3)
Chloride: 100 mmol/L (ref 98–111)
Creatinine, Ser: 2.68 mg/dL — ABNORMAL HIGH (ref 0.61–1.24)
GFR, Estimated: 24 mL/min — ABNORMAL LOW (ref >60)
Glucose, Bld: 84 mg/dL (ref 70–99)
Potassium: 3 mmol/L — ABNORMAL LOW (ref 3.5–5.1)
Sodium: 139 mmol/L (ref 135–145)

## 2021-11-01 LAB — CBC
HCT: 43.2 % (ref 39.0–52.0)
Hemoglobin: 13.2 g/dL (ref 13.0–17.0)
MCH: 28.9 pg (ref 26.0–34.0)
MCHC: 30.6 g/dL (ref 30.0–36.0)
MCV: 94.5 fL (ref 80.0–100.0)
Platelets: 146 10*3/uL — ABNORMAL LOW (ref 150–400)
RBC: 4.57 MIL/uL (ref 4.22–5.81)
RDW: 17.7 % — ABNORMAL HIGH (ref 11.5–15.5)
WBC: 4.1 10*3/uL (ref 4.0–10.5)
nRBC: 0 % (ref 0.0–0.2)

## 2021-11-01 LAB — ECHOCARDIOGRAM COMPLETE
AR max vel: 2.39 cm2
AV Area VTI: 2.47 cm2
AV Area mean vel: 2.21 cm2
AV Mean grad: 2 mmHg
AV Peak grad: 3.4 mmHg
Ao pk vel: 0.92 m/s
Area-P 1/2: 4.93 cm2
Height: 68 in
S' Lateral: 5.7 cm
Single Plane A4C EF: 33.2 %
Weight: 2955.93 oz

## 2021-11-01 LAB — GLUCOSE, CAPILLARY
Glucose-Capillary: 88 mg/dL (ref 70–99)
Glucose-Capillary: 98 mg/dL (ref 70–99)

## 2021-11-01 LAB — CORTISOL: Cortisol, Plasma: 11.6 ug/dL

## 2021-11-01 LAB — PROTIME-INR
INR: 3.9 — ABNORMAL HIGH (ref 0.8–1.2)
Prothrombin Time: 38.2 seconds — ABNORMAL HIGH (ref 11.4–15.2)

## 2021-11-01 LAB — MRSA NEXT GEN BY PCR, NASAL: MRSA by PCR Next Gen: NOT DETECTED

## 2021-11-01 LAB — LACTIC ACID, PLASMA: Lactic Acid, Venous: 1.6 mmol/L (ref 0.5–1.9)

## 2021-11-01 LAB — D-DIMER, QUANTITATIVE: D-Dimer, Quant: 1.83 ug/mL-FEU — ABNORMAL HIGH (ref 0.00–0.50)

## 2021-11-01 LAB — PHOSPHORUS: Phosphorus: 4.9 mg/dL — ABNORMAL HIGH (ref 2.5–4.6)

## 2021-11-01 MED ORDER — BUDESONIDE 0.25 MG/2ML IN SUSP
0.2500 mg | Freq: Two times a day (BID) | RESPIRATORY_TRACT | Status: DC
  Administered 2021-11-01 – 2021-11-08 (×15): 0.25 mg via RESPIRATORY_TRACT
  Filled 2021-11-01 (×14): qty 2

## 2021-11-01 MED ORDER — INFLUENZA VAC A&B SA ADJ QUAD 0.5 ML IM PRSY
0.5000 mL | PREFILLED_SYRINGE | INTRAMUSCULAR | Status: AC
  Administered 2021-11-02: 17:00:00 0.5 mL via INTRAMUSCULAR
  Filled 2021-11-01: qty 0.5

## 2021-11-01 MED ORDER — SODIUM CHLORIDE 0.9 % IV SOLN
1.0000 g | INTRAVENOUS | Status: DC
  Administered 2021-11-01 – 2021-11-02 (×2): 1 g via INTRAVENOUS
  Filled 2021-11-01 (×2): qty 1

## 2021-11-01 MED ORDER — POTASSIUM CHLORIDE 10 MEQ/100ML IV SOLN
10.0000 meq | INTRAVENOUS | Status: AC
  Administered 2021-11-01 (×2): 10 meq via INTRAVENOUS
  Filled 2021-11-01 (×2): qty 100

## 2021-11-01 MED ORDER — HYDROMORPHONE HCL 1 MG/ML IJ SOLN
0.2500 mg | INTRAMUSCULAR | Status: DC | PRN
  Administered 2021-11-01 – 2021-11-07 (×7): 0.25 mg via INTRAVENOUS
  Filled 2021-11-01 (×7): qty 1

## 2021-11-01 MED ORDER — CHLORHEXIDINE GLUCONATE CLOTH 2 % EX PADS
6.0000 | MEDICATED_PAD | Freq: Every day | CUTANEOUS | Status: DC
  Administered 2021-11-01 – 2021-11-08 (×7): 6 via TOPICAL

## 2021-11-01 MED ORDER — IPRATROPIUM-ALBUTEROL 0.5-2.5 (3) MG/3ML IN SOLN
3.0000 mL | Freq: Four times a day (QID) | RESPIRATORY_TRACT | Status: DC | PRN
  Administered 2021-11-02: 06:00:00 3 mL via RESPIRATORY_TRACT
  Filled 2021-11-01: qty 3

## 2021-11-01 MED ORDER — POTASSIUM CHLORIDE 10 MEQ/100ML IV SOLN
10.0000 meq | INTRAVENOUS | Status: AC
  Administered 2021-11-01 (×3): 10 meq via INTRAVENOUS
  Filled 2021-11-01 (×3): qty 100

## 2021-11-01 MED ORDER — SODIUM CHLORIDE 0.9 % IV SOLN
100.0000 mg | Freq: Two times a day (BID) | INTRAVENOUS | Status: DC
  Administered 2021-11-01 – 2021-11-02 (×3): 100 mg via INTRAVENOUS
  Filled 2021-11-01 (×4): qty 100

## 2021-11-01 MED ORDER — WARFARIN - PHARMACIST DOSING INPATIENT: Freq: Every day | Status: DC

## 2021-11-01 NOTE — Progress Notes (Signed)
Patient now more awake and talkative. Speech is markedly garbled and patient has a slight right facial droop. Per ED report, patient speech garbled on arrival. Bilateral grips equal but weak, PERRLA, wiggles toes on both feet. Findings reported to NP and STAT head CT ordered and completed. No acute events during the trip. Patient now on 4L [place] and tolerating well. O2 sats 94%. Will continue to monitor.

## 2021-11-01 NOTE — Plan of Care (Signed)

## 2021-11-01 NOTE — Progress Notes (Signed)
eLink Physician-Brief Progress Note Patient Name: Adrian Chavez DOB: 12-Jul-1941 MRN: [id number]   Date of Service  11/01/2021  HPI/Events of Note  Patient admitted with acute respiratory failure secondary to pulmonary edema , and suspected pneumonia, patient also has persistent atrial fibrillation and acute kidney injury.  eICU Interventions  New Patient Evaluation completed.        [name] 11/01/2021, 12:46 AM

## 2021-11-01 NOTE — Progress Notes (Signed)
Chaplain Maggie made initial visit to introduce spiritual care. °

## 2021-11-01 NOTE — Progress Notes (Signed)
*  PRELIMINARY RESULTS* Echocardiogram 2D Echocardiogram has been performed.  Sherrie Sport 11/01/2021, 11:02 AM

## 2021-11-01 NOTE — Progress Notes (Signed)
Initial Nutrition Assessment  DOCUMENTATION CODES:   Not applicable  INTERVENTION:   RD will add supplements once pt's diet is advanced.   Recommend MVI po daily   Pt at high refeed risk; recommend monitor potassium, magnesium and phosphorus labs daily until stable  NUTRITION DIAGNOSIS:   Inadequate oral intake related to acute illness as evidenced by NPO status.  GOAL:   Patient will meet greater than or equal to 90% of their needs  MONITOR:   Labs, Weight trends, Skin, I & O's, Diet advancement  REASON FOR ASSESSMENT:   Malnutrition Screening Tool    ASSESSMENT:   80 y.o male  with significant PMH of DVT, ischemic colitis, NICM, CKD, chronic anticoagulation, COVID-19, CHF, NYHA class 2, essential hypertension, ICD, atrial fibrillation, benign prostatic hyperplasia with lower urinary tract symptoms who presented to the ED with chief complaints of progressive shortness of breath.  Met with pt in room today. Pt reports that he is doing well today and is requesting to eat or drink. Pt seen by SLP this morning and remains NPO. Pt reports that at baseline, he has good appetite and oral intake. Pt does not drink supplements at home. RD discussed with pt the importance of adequate nutrition needed to preserve lean muscle. Pt is willing to drink supplements once his diet is advanced. Per chart, pt appears weight stable at baseline.   Medications reviewed and include: warfarin, ceftriaxone, doxycycline   Labs reviewed: K 3.0(L), BUN 54(H), creat 2.68(H), P 4.9(H), Mg 2.3 wnl   NUTRITION - FOCUSED PHYSICAL EXAM:  Flowsheet Row Most Recent Value  Orbital Region No depletion  Upper Arm Region Moderate depletion  Thoracic and Lumbar Region No depletion  Buccal Region No depletion  Temple Region Mild depletion  Clavicle Bone Region Mild depletion  Clavicle and Acromion Bone Region Mild depletion  Scapular Bone Region No depletion  Dorsal Hand No depletion  Patellar Region No  depletion  Anterior Thigh Region No depletion  Posterior Calf Region No depletion  Edema (RD Assessment) Moderate  Hair Reviewed  Eyes Reviewed  Mouth Reviewed  Skin Reviewed  Nails Reviewed   Diet Order:   Diet Order             Diet NPO time specified Except for: Ice Chips, Other (See Comments)  Diet effective now                  EDUCATION NEEDS:   Education needs have been addressed  Skin:  Skin Assessment: Reviewed RN Assessment (ecchymosis)  Last BM:  12/28  Height:   Ht Readings from Last 1 Encounters:  11/01/21 5' 8"  (1.727 m)    Weight:   Wt Readings from Last 1 Encounters:  11/01/21 83.8 kg    Ideal Body Weight:  70 kg  BMI:  Body mass index is 28.09 kg/m.  Estimated Nutritional Needs:   Kcal:  1900-2200kcal/day  Protein:  95-110g/day  Fluid:  1.8-2.1L/day  Koleen Distance MS, RD, LDN Please refer to [facility] for RD and/or RD on-call/weekend/after hours pager

## 2021-11-01 NOTE — Consult Note (Signed)
PHARMACY CONSULT NOTE - FOLLOW UP  Pharmacy Consult for Electrolyte Monitoring and Replacement   Recent Labs: Potassium (mmol/L)  Date Value  11/01/2021 3.0 (L)  04/07/2012 3.7   Magnesium (mg/dL)  Date Value  11/01/2021 2.3   Calcium (mg/dL)  Date Value  11/01/2021 8.7 (L)   Calcium, Total (mg/dL)  Date Value  04/07/2012 8.6   Albumin (g/dL)  Date Value  10/31/2021 3.8  04/07/2012 3.6   Phosphorus (mg/dL)  Date Value  11/01/2021 4.9 (H)   Sodium (mmol/L)  Date Value  11/01/2021 139  04/07/2012 141     Assessment: 80yo male with PMH of DVT, Ischemic Colitis, NICM (nonischemic cardiomyopathy), AKI (acute kidney injury), Chronic anticoagulation, Chronic systolic CHF, NYHA class 2, Essential hypertension, ICD in place, Atrial fibrillation, Benign prostatic hyperplasia who was admitted to the hospital for SOB. Pharmacy was consulted for electrolyte replacement and monitoring.  Labs Na: 139 K: 3.0 Phos: 4.9 Mg: 2.3  Goal of Therapy:  Electrolytes WNL  Plan:  --MD placed order for 31mEq Kcl IV q1h x2 doses --No additional replacement is currently indicated  --Will continue to monitor and replace electrolytes as clinically indicated   Narda Rutherford, PharmD Pharmacy Resident  11/01/2021 12:32 PM

## 2021-11-01 NOTE — Evaluation (Signed)
Clinical/Bedside Swallow Evaluation Patient Details  Name: Adrian Chavez MRN: [id number] Date of Birth: May 29, 1941  Today's Date: 11/01/2021 Time: SLP Start Time (ACUTE ONLY): 0905 SLP Stop Time (ACUTE ONLY): 2229 SLP Time Calculation (min) (ACUTE ONLY): 30 min  Past Medical History:  Past Medical History:  Diagnosis Date   Anxiety    CHF (congestive heart failure) ([place])    Claustrophobia    Colitis, ischemic ([place]) 2010   developed DVT   DVT (deep venous thrombosis) ([place]) 2010   stomach and leg   Dysrhythmia    A-Fib   Hypertension    Past Surgical History:  Past Surgical History:  Procedure Laterality Date   HERNIA REPAIR Right 2007   IMPLANTABLE CARDIOVERTER DEFIBRILLATOR (ICD) GENERATOR CHANGE Left 02/21/2016   Procedure: ICD GENERATOR CHANGE;  Surgeon: Isaias Cowman, MD;  Location: [facility] ORS;  Service: Cardiovascular;  Laterality: Left;   PACEMAKER INSERTION     PITUITARY SURGERY  2002   Done at [facility]   HPI:  Per 92 H&P "80 y.o male  with significant PMH of DVT, Ischemic Colitis, NICM (nonischemic cardiomyopathy), AKI (acute kidney injury), Chronic anticoagulation, History of 2019 novel coronavirus disease ([id number]-11), Chronic systolic CHF (congestive heart failure), NYHA class 2, Essential hypertension, ICD (implantable cardioverter-defibrillator) in place, Atrial fibrillation, Benign prostatic hyperplasia with lower urinary tract symptoms who presented to the ED with chief complaints of progressive shortness of breath.     Per family members at the bedside, EMS was called for complaints of progressive SOB. The family members reported that the patient has been more weak for a week due to nausea, vomiting, and diarrhea. No reports of fever, chills, chest pain, abdominal pain, or cough. On EMS arrival, patient was already on NRB, he was hypotensive, but alert and oriented. After IV access was obtained, responders began administering fluid and placing the patient on the  stair chair. Enroute the patient  received IV fluids. The patient remained alert and oriented, but became lethargic upon arrival to the hospital." CXR on admission "1. Cardiac enlargement, small left pleural effusion and pulmonary  vascular congestion. Correlate for any signs or symptoms of CHF.  2. Right base atelectasis." Head CT on admission "No acute finding.  No hemorrhage or visible infarct."    Assessment / Plan / Recommendation  Clinical Impression  Pt seen for clinical swallowing evaluation. *Pt alert. Hoarse/harsh vocal quality noted. Imprecise articulation with mildly reduced speech intelligibility. Son at bedside. Son noted pt's speech is "much better" than yesterday, but not at pt's baseline. Pt and son deny pt with s/sx oropharyngeal dysphagia PTA. Cleared with RN.  Oral motor exam completed. Pt with missing upper dentition (son reports pt wears upper partial; not present for evaluation). Pt with xerostomia with dried/cracked appearance to lingual body. No obvious orofacial weakness/decreased ROM appreciated. Pt currently on 2L/min O2 via [place].   Per chart review, temp and WBC WNL. CXR 10/31/21 "1. Cardiac enlargement, small left pleural effusion and pulmonary  vascular congestion. Correlate for any signs or symptoms of CHF.  2. Right base atelectasis."  Pt demonstrated a functional oral swallow for consistencies assessed. Pt with s/sx concerning for highly suspected pharyngeal dysphagia including delayed throat clearing (across all consistencies)  as well as subtle wet vocal quality and immediate cough with thin liquids via cup sip. Pt with "stridorous"-sounding respirations following trials of thin liquids via cup sip and pt notably SOB. Further PO trials deferred at this time given overall pt's clinical presentation.   A safe  oral diet cannot be recommended at this time. Recommend NPO x ice chips in moderation for comfort. Prefer non-oral means of medication administration; however, pt may  have critical medication crushed in applesauce if unable to be given via non-oral route. Recommend direct 1:1 supervision with any POs.   SLP to f/u next date for clinical swallowing re-evaluation.   Pt, pt's son, and RN made aware of results, recommendations, and SLP POC. Pt and son verbalized understanding/agreement.  SLP Visit Diagnosis: Dysphagia, pharyngeal phase (R13.13)    Aspiration Risk  Mild aspiration risk    Diet Recommendation Ice chips PRN after oral care;NPO except meds   Medication Administration: Via alternative means (prefer via alternative means; OK for critical meds crushed in applesauce if unable to be given via alternative means) Supervision: Full supervision/cueing for compensatory strategies (supervision with all POs) Compensations: Slow rate;Minimize environmental distractions (for ice chips and critical meds crushed with applesauce) Postural Changes: Seated upright at 90 degrees    Other  Recommendations Oral Care Recommendations: Oral care QID    Recommendations for follow up therapy are one component of a multi-disciplinary discharge planning process, led by the attending physician.  Recommendations may be updated based on patient status, additional functional criteria and insurance authorization.  Follow up Recommendations  (TBD)      Assistance Recommended at Discharge  (TBD)  Functional Status Assessment Patient has had a recent decline in their functional status and demonstrates the ability to make significant improvements in function in a reasonable and predictable amount of time.  Frequency and Duration min 2x/week  2 weeks       Prognosis Prognosis for Safe Diet Advancement: Fair Barriers to Reach Goals: Severity of deficits      Swallow Study   General Date of Onset: 10/31/21 HPI: Per 22 H&P "80 y.o male  with significant PMH of DVT, Ischemic Colitis, NICM (nonischemic cardiomyopathy), AKI (acute kidney injury), Chronic  anticoagulation, History of 2019 novel coronavirus disease ([id number]), Chronic systolic CHF (congestive heart failure), NYHA class 2, Essential hypertension, ICD (implantable cardioverter-defibrillator) in place, Atrial fibrillation, Benign prostatic hyperplasia with lower urinary tract symptoms who presented to the ED with chief complaints of progressive shortness of breath.     Per family members at the bedside, EMS was called for complaints of progressive SOB. The family members reported that the patient has been more weak for a week due to nausea, vomiting, and diarrhea. No reports of fever, chills, chest pain, abdominal pain, or cough. On EMS arrival, patient was already on NRB, he was hypotensive, but alert and oriented. After IV access was obtained, responders began administering fluid and placing the patient on the stair chair. Enroute the patient  received IV fluids. The patient remained alert and oriented, but became lethargic upon arrival to the hospital." CXR on admission "1. Cardiac enlargement, small left pleural effusion and pulmonary  vascular congestion. Correlate for any signs or symptoms of CHF.  2. Right base atelectasis." Head CT on admission "No acute finding.  No hemorrhage or visible infarct." Type of Study: Bedside Swallow Evaluation Previous Swallow Assessment: unknown Diet Prior to this Study: NPO Respiratory Status: Nasal cannula (2L/min) History of Recent Intubation: No Behavior/Cognition: Alert;Cooperative Oral Cavity Assessment: Dry Oral Care Completed by SLP: Yes Oral Cavity - Dentition:  (upper partial) Vision: Functional for self-feeding Self-Feeding Abilities: Able to feed self Patient Positioning: Upright in bed Baseline Vocal Quality:  (hoarse/harsh) Volitional Cough: Wet    Oral/Motor/Sensory Function Overall Oral Motor/Sensory Function: Within functional  limits   Ice Chips Ice chips: Impaired Presentation: Self Fed Pharyngeal Phase Impairments: Throat  Clearing - Delayed Other Comments: x10   Thin Liquid Thin Liquid: Impaired Presentation: Spoon;Cup Pharyngeal  Phase Impairments: Throat Clearing - Delayed;Cough - Delayed;Wet Vocal Quality Other Comments: "stridorous"-sounding breathing; via single teaspoons and single cup sips; overt s/sx with cup sips    Puree Puree: Impaired Presentation: Self Fed Pharyngeal Phase Impairments: Throat Clearing - Delayed Other Comments: x1 very delayed throat clear; total ~2 oz pplesauce consumed via single teaspoon   Solid           Cherrie Gauze, M.S., [facility] [phone] (ASCOM)  Adrian Chavez 11/01/2021,10:59 AM

## 2021-11-01 NOTE — Progress Notes (Signed)
NAME:  Adrian Chavez, MRN:  [id number], DOB:  May 16, 1941, LOS: 1 ADMISSION DATE:  10/31/2021, CONSULTATION DATE:  10/31/2021 REFERRING MD:  Duffy Bruce MD  CHIEF COMPLAINT: Shortness of Breath   HPI  80 y.o male  with significant PMH of DVT, Ischemic Colitis, NICM (nonischemic cardiomyopathy), AKI (acute kidney injury), Chronic anticoagulation, History of 2019 novel coronavirus disease ([id number]), Chronic systolic CHF (congestive heart failure), NYHA class 2, Essential hypertension, ICD (implantable cardioverter-defibrillator) in place, Atrial fibrillation, Benign prostatic hyperplasia with lower urinary tract symptoms who presented to the ED with chief complaints of progressive shortness of breath.  Per family members at the bedside, EMS was called for complaints of progressive SOB. The family members reported that the patient has been more weak for a week due to nausea, vomiting, and diarrhea. No reports of fever, chills, chest pain, abdominal pain, or cough. On EMS arrival, patient was already on NRB, he was hypotensive, but alert and oriented. After IV access was obtained, responders began administering fluid and placing the patient on the stair chair. Enroute the patient  received IV fluids. The patient remained alert and oriented, but became lethargic upon arrival to the hospital.   ED Course: In the emergency department, the temperature was 36.1C, the heart rate 80 beats/minute, the blood pressure 98/85  mm Hg, the respiratory rate 19 breaths/minute, and the oxygen saturation 96% on 15% NRB. He was lethargic, and responded with one-word answers; symmetric movement in the arms and legs was observed. An electrocardiogram showed ventricular paced rhythm with rate 60 and prolonged Qtc.   Pertinent Labs in Red/Diagnostics Findings: Na+/ K+: 139/3.2 Glucose: 121 BUN/Cr.: 56/2.93 AST/ALT: 57/52  WBC: 3.9 PCT: <0.10 Lactic acid: 1.2 COVID PCR: Negative   Troponin: 48 BNP:>4500 Arterial  Blood Gas result:  pO2 300; pCO2 67; pH 7.29;  HCO3 32.2, %O2 Sat 99.9.  Patient was placed on BiPAP due to VBG finding concerning for worsening hypoxia and hypercapnea. Patient received additional 500 cc IV fluids bolus and started on broad spectrum antibiotics for suspected infectious process. Due to high risk for decompensation and repeat abnormal ABG findings, PCCM ask to admit to ICU for further management.  Past Medical History   DVT   Ischemic Colitis 03/05/2021  NICM (nonischemic cardiomyopathy) ([place]) 03/05/2021  AKI (acute kidney injury) ([place]) 03/05/2021  Chronic anticoagulation 03/05/2021  History of 2019 novel coronavirus disease (COVID-19) 47/65/4650  Chronic systolic CHF (congestive heart failure), NYHA class 2 ([place]) 02/26/2018  Essential hypertension 05/17/2014  ICD (implantable cardioverter-defibrillator) in place 05/17/2014  Atrial fibrillation ([place]) 03/03/2014  Benign prostatic hyperplasia with lower urinary tract symptoms 07/10/2012   Significant Hospital Events   10/31/21> Admitted to ICU with acute hypoxic hypercapnic respiratory failure  Consults:  Cardiology  Procedures:  None  Significant Diagnostic Tests:  12/28: Chest Xray>Cardiac enlargement, small left pleural effusion and pulmonary vascular congestion. Correlate for any signs or symptoms of CHF. 2. Right base atelectasis. 12/28: Abdominal xray>  Micro Data:  12/28: SARS-CoV-2 PCR> negative 12/28: Influenza PCR> negative 12/28: Blood culture x2> 12/28: Urine Culture> 12/28: MRSA PCR>>  12/28: Strep pneumo urinary antigen> 12/28: Legionella urinary antigen>  Antimicrobials:  Vancomycin 12/28 x 1 Cefepime 12/28 x 1 Metronidazole 12/28 x 1   OBJECTIVE  Blood pressure 101/73, pulse 78, temperature 98 F (36.7 C), temperature source Oral, resp. rate 19, height 5\' 8"  (1.727 m), weight 83.8 kg, SpO2 99 %.        Intake/Output Summary (Last 24 hours) at 11/01/2021 0952 Last  data filed at  11/01/2021 0900 Gross per 24 hour  Intake 1743.33 ml  Output 1250 ml  Net 493.33 ml    Filed Weights   10/31/21 2007 11/01/21 0000 11/01/21 0410  Weight: 80.5 kg 84.7 kg 83.8 kg   Physical Examination  GENERAL: 80 year-old critically ill patient lying in the bed on BIPAP EYES: Pupils equal, round, reactive to light and accommodation. No scleral icterus. Extraocular muscles intact.  HEENT: Head atraumatic, normocephalic. Oropharynx and nasopharynx clear.  NECK:  Supple, no jugular venous distention. No thyroid enlargement, no tenderness.  LUNGS: Decreased breath sounds bilaterally, no wheezing, mild rales and rhonchi. No use of accessory muscles of respiration.  CARDIOVASCULAR: S1, S2 normal. No murmurs, rubs, or gallops.  ABDOMEN: Soft, nontender, nondistended. Bowel sounds present. No organomegaly or mass.  EXTREMITIES: Mild +1 pitting edema with cyanosis, No clubbing.  NEUROLOGIC: Cranial nerves II through XII are intact.  Moves all extremities. Sensation intact. Gait not checked.  PSYCHIATRIC: The patient is on BIPAP SKIN: No obvious rash, lesion, or ulcer.   Labs/imaging that I havepersonally reviewed  (right click and "Reselect all SmartList Selections" daily)  I, personally viewed and interpreted this ECG. EKG Interpretation Date: 10/31/2021 V.paced. rate 60 BPM PR interval * ms QRS duration 176 ms QT/QTcB 554/554 ms P-R-T axes * -87 90   Labs   CBC: Recent Labs  Lab 10/31/21 2008 11/01/21 0523  WBC 3.9* 4.1  NEUTROABS 2.1  --   HGB 14.6 13.2  HCT 48.0 43.2  MCV 95.8 94.5  PLT 174 146*     Basic Metabolic Panel: Recent Labs  Lab 10/31/21 2008 11/01/21 0523  NA 139 139  K 3.2* 3.0*  CL 98 100  CO2 31 31  GLUCOSE 121* 84  BUN 56* 54*  CREATININE 2.93* 2.68*  CALCIUM 9.1 8.7*  MG  --  2.3  PHOS  --  4.9*    GFR: Estimated Creatinine Clearance: 24 mL/min (A) (by C-G formula based on SCr of 2.68 mg/dL (H)). Recent Labs  Lab 10/31/21 2008  10/31/21 2028 10/31/21 2354 11/01/21 0523  PROCALCITON  --  <0.10  --   --   WBC 3.9*  --   --  4.1  LATICACIDVEN 1.7  --  1.6  --      Liver Function Tests: Recent Labs  Lab 10/31/21 2008  AST 57*  ALT 52*  ALKPHOS 92  BILITOT 2.9*  PROT 6.6  ALBUMIN 3.8    No results for input(s): LIPASE, AMYLASE in the last 168 hours. No results for input(s): AMMONIA in the last 168 hours.  ABG    Component Value Date/Time   PHART 7.29 (L) 10/31/2021 2211   PCO2ART 67 (HH) 10/31/2021 2211   PO2ART 300 (H) 10/31/2021 2211   HCO3 32.2 (H) 10/31/2021 2211   O2SAT 99.9 10/31/2021 2211      Coagulation Profile: Recent Labs  Lab 10/31/21 2146 11/01/21 0523  INR 3.9* 3.9*     Cardiac Enzymes: No results for input(s): CKTOTAL, CKMB, CKMBINDEX, TROPONINI in the last 168 hours.  HbA1C: No results found for: HGBA1C  CBG: Recent Labs  Lab 10/31/21 2342  GLUCAP 88    Review of Systems:   Patient on BIPAP unable to obtain  Past Medical History  He,  has a past medical history of Anxiety, CHF (congestive heart failure) ([place]), Claustrophobia, Colitis, ischemic ([place]) (2010), DVT (deep venous thrombosis) ([place]) (2010), Dysrhythmia, and Hypertension.   Surgical History    Past  Surgical History:  Procedure Laterality Date   HERNIA REPAIR Right 2007   IMPLANTABLE CARDIOVERTER DEFIBRILLATOR (ICD) GENERATOR CHANGE Left 02/21/2016   Procedure: ICD GENERATOR CHANGE;  Surgeon: Isaias Cowman, MD;  Location: [facility] ORS;  Service: Cardiovascular;  Laterality: Left;   PACEMAKER INSERTION     PITUITARY SURGERY  2002   Done at [place] History   reports that he quit smoking about 47 years ago. His smoking use included cigarettes. He has never used smokeless tobacco. He reports that he does not drink alcohol and does not use drugs.   Family History   His family history is not on file.   Allergies Allergies  Allergen Reactions   Escitalopram Oxalate Nausea Only    Oxycodone     vomiting     Home Medications  Prior to Admission medications   Medication Sig Start Date End Date Taking? Authorizing Provider  albuterol (VENTOLIN HFA) 108 (90 Base) MCG/ACT inhaler SMARTSIG:1-2 Inhalation Via Inhaler Every 4 Hours PRN 12/25/20   [provider]  ALPRAZolam Duanne Moron) 0.5 MG tablet Take 0.5 mg by mouth daily as needed for anxiety.     [provider]  amiodarone (PACERONE) 200 MG tablet Take 200 mg by mouth 2 (two) times daily. 09/12/21   [provider]  azelastine (ASTELIN) 0.1 % nasal spray Place into the nose. 03/04/20   [provider]  carvedilol (COREG) 3.125 MG tablet Take 3.125 mg by mouth 2 (two) times daily.    [provider]  cetirizine (ZYRTEC) 10 MG tablet Take 1 tablet by mouth daily. 06/21/20   [provider]  CIALIS 5 MG tablet Take 5 mg by mouth daily.     [provider]  ENTRESTO 49-51 MG Take 1 tablet by mouth 2 (two) times daily. 03/26/21   [provider]  finasteride (PROSCAR) 5 MG tablet Take 1 tablet (5 mg total) by mouth daily. 03/27/21   Zara Council A, PA-C  fluticasone (FLONASE) 50 MCG/ACT nasal spray Place 2 sprays into both nostrils daily as needed for allergies or rhinitis.    [provider]  metolazone (ZAROXOLYN) 5 MG tablet Take 5 mg by mouth daily. 09/03/21   [provider]  potassium chloride SA (K-DUR,KLOR-CON) 20 MEQ tablet Take 20 mEq by mouth 2 (two) times daily.    [provider]  torsemide (DEMADEX) 20 MG tablet Take 20 mg by mouth 2 (two) times daily.    [provider]  vitamin B-12 (CYANOCOBALAMIN) 1000 MCG tablet Take 1,000 mcg by mouth daily.    [provider]  warfarin (COUMADIN) 1 MG tablet Take 3-4 mg by mouth See admin instructions. Take 2 MG by mouth every Monday, Tuesday, 3 mg Wednesday, 2 mg Thursday and Friday evening and 3 MG by mouth every Saturday and Sunday evening    [provider]  Scheduled Meds:  budesonide (PULMICORT) nebulizer solution  0.25 mg Nebulization BID   Chlorhexidine Gluconate Cloth  6 each Topical Daily   [START ON 11/02/2021] influenza vaccine adjuvanted  0.5 mL Intramuscular Tomorrow-1000   Warfarin - Pharmacist Dosing Inpatient   Does not apply q1600   Continuous Infusions:  cefTRIAXone (ROCEPHIN)  IV     doxycycline (VIBRAMYCIN) IV Stopped (11/01/21 2751)   lactated ringers Stopped (10/31/21 2042)   norepinephrine (LEVOPHED) Adult infusion Stopped (10/31/21 2043)   potassium chloride 10 mEq (11/01/21 0910)   sodium chloride Stopped (10/31/21 2042)   PRN Meds:.docusate sodium, ipratropium-albuterol,  polyethylene glycol  Assessment & Plan:   Acute Hypoxic Hypercapnic Respiratory Failure secondary to Pulmonary Edema and probabale underlying Pneumonia -Supplemental O2 as needed to maintain O2 saturations 88 to 92% -BiPAP, may move to prn and qHS if able -Patient would not wish to be placed on the vent -Follow intermittent ABG and chest x-ray as needed -IV Lasix as blood pressure and renal function permits, trend renal indices in AM and address volume -F/u cultures, trend PCT/WBC -Start on Ceftriaxone and Doxy  -Budesonide inhaler/nebs BID, bronchodilators PRN   Acute on Chronic Systolic CHF (Last known LVEF 15-20%) Cardiogenic Shock Known ZG:YFVC, HTN, HLD, OSA, PE -Significantly volume overloaded with BNP >4500 -Will need volume reduction as kidneys allow -Continuous cardiac monitoring -Vasopressors as needed to  keep MAP goal -Echo pending -Daily weights -Strict I's and O's -Trend High-sensitivity troponin  -Diuresis as BP and renal function permits   Permanent Atrial Fibrillation -He remains rate controlled and vpaced -Hold Amiodarone for now, Qtc prolonged  and unable to tolerate po -CHADS2VASc  -Patient on Coumadin at home, PT/INR supra-therapeutic, hold anticoagulation. pharmacy consult for management.  Mildly  Elevated LFTs~ suspect due to Hepatic congestion with CHF -Follow LFT's and bleeding times -Advance diet as tolerated -PPI  AKI on CKD Hypokalemia -Monitor I&O's / urinary output -Follow BMP -Ensure adequate renal perfusion -Avoid nephrotoxic agents as able -Replace electrolytes as indicated -Consider Nephrology consult pending next group of renal indices  Best practice:  Diet:  NPO Pain/Anxiety/Delirium protocol (if indicated): No VAP protocol (if indicated): Not indicated DVT prophylaxis: Contraindicated GI prophylaxis: PPI Glucose control:  SSI No Central venous access:  N/A Arterial line:  N/A Foley:  Yes, and it is still needed Mobility:  bed rest  PT consulted: N/A Last date of multidisciplinary goals of care discussion [12/28] Code Status:  DNR Disposition: ICU   = Goals of Care = Code Status Order: DNR  Primary Emergency Contact: Goettl,Linda S, Home Phone: [phone] Discussed with patient's family (wife and son) at the bedside/ They would wishes to pursue ongoing treatment, but concurred that if deteriorated to pulselessness, patient would prefer a natural death as opposed to invasive measures such as CPR and intubation.  Family should be immediately contacted in such situations.  Critical care time: 35 minutes    //Jamyron Redd  .

## 2021-11-01 NOTE — Progress Notes (Signed)
Patient K+ currently 3.0. Reported to NP.

## 2021-11-01 NOTE — Consult Note (Addendum)
ANTICOAGULATION CONSULT NOTE - Initial Consult  Pharmacy Consult for warfarin  Indication: atrial fibrillation and LV thrombus  Allergies  Allergen Reactions   Escitalopram Oxalate Nausea Only   Oxycodone     vomiting    Patient Measurements: Height: 5\' 8"  (172.7 cm) Weight: 83.8 kg (184 lb 11.9 oz) IBW/kg (Calculated) : 68.4  Vital Signs: Temp: 97.2 F (36.2 C) (12/29 0400) Temp Source: Axillary (12/29 0400) BP: 116/82 (12/29 0600) Pulse Rate: 80 (12/29 0600)  Labs: Recent Labs    10/31/21 2008 10/31/21 2028 10/31/21 2146 11/01/21 0523  HGB 14.6  --   --  13.2  HCT 48.0  --   --  43.2  PLT 174  --   --  146*  APTT  --   --  45*  --   LABPROT  --   --  38.2* 38.2*  INR  --   --  3.9* 3.9*  CREATININE 2.93*  --   --  2.68*  TROPONINIHS  --  48*  --   --     Estimated Creatinine Clearance: 24 mL/min (A) (by C-G formula based on SCr of 2.68 mg/dL (H)).   Medical History: Past Medical History:  Diagnosis Date   Anxiety    CHF (congestive heart failure) ([place])    Claustrophobia    Colitis, ischemic ([place]) 2010   developed DVT   DVT (deep venous thrombosis) ([place]) 2010   stomach and leg   Dysrhythmia    A-Fib   Hypertension     Medications:  Scheduled:   budesonide (PULMICORT) nebulizer solution  0.25 mg Nebulization BID   Chlorhexidine Gluconate Cloth  6 each Topical Daily   [START ON 11/02/2021] influenza vaccine adjuvanted  0.5 mL Intramuscular Tomorrow-1000   Warfarin - Pharmacist Dosing Inpatient   Does not apply q1600    Assessment: 80yo male with PMH of DVT, Ischemic Colitis, NICM (nonischemic cardiomyopathy), AKI (acute kidney injury), Chronic anticoagulation, Chronic systolic CHF, NYHA class 2, Essential hypertension, ICD in place, Atrial fibrillation, Benign prostatic hyperplasia who was admitted to the hospital for SOB. Pharmacy was consulted for warfarin dosing and monitoring.  PTA regimen: 2mg  daily(TWD=14) (confirmed with PCP)  Last dose:  12/27  Date/time INR Interpretation  12/28 2146 3.9 Suprathera 12/29 0523 3.9 Suprathera, dose held  Goal of Therapy:  INR 2-3 Monitor platelets by anticoagulation protocol: Yes   Plan:  --Warfarin supratherapeutic, will hold dose on 12/29 --Will check INR with AM labs --Will continue to monitor CBC every 3 days  Narda Rutherford, PharmD Pharmacy Resident  11/01/2021 12:26 PM

## 2021-11-01 NOTE — Progress Notes (Signed)
1400 wife reports that patient is having left shoulder pain from a fall in the garage last week. Informed CC team orders received

## 2021-11-01 NOTE — Progress Notes (Signed)
Contact Information: Dayle Mcnerney [phone] (Primary Cell)

## 2021-11-02 ENCOUNTER — Inpatient Hospital Stay: Payer: HMO

## 2021-11-02 LAB — BASIC METABOLIC PANEL
Anion gap: 9 (ref 5–15)
BUN: 46 mg/dL — ABNORMAL HIGH (ref 8–23)
CO2: 30 mmol/L (ref 22–32)
Calcium: 8.8 mg/dL — ABNORMAL LOW (ref 8.9–10.3)
Chloride: 103 mmol/L (ref 98–111)
Creatinine, Ser: 2.29 mg/dL — ABNORMAL HIGH (ref 0.61–1.24)
GFR, Estimated: 29 mL/min — ABNORMAL LOW (ref >60)
Glucose, Bld: 78 mg/dL (ref 70–99)
Potassium: 3.4 mmol/L — ABNORMAL LOW (ref 3.5–5.1)
Sodium: 142 mmol/L (ref 135–145)

## 2021-11-02 LAB — PROTIME-INR
INR: 3.5 — ABNORMAL HIGH (ref 0.8–1.2)
Prothrombin Time: 35 seconds — ABNORMAL HIGH (ref 11.4–15.2)

## 2021-11-02 LAB — MAGNESIUM: Magnesium: 2 mg/dL (ref 1.7–2.4)

## 2021-11-02 LAB — PHOSPHORUS: Phosphorus: 4.5 mg/dL (ref 2.5–4.6)

## 2021-11-02 MED ORDER — METHYLPREDNISOLONE SODIUM SUCC 125 MG IJ SOLR
125.0000 mg | Freq: Once | INTRAMUSCULAR | Status: AC
  Administered 2021-11-02: 09:00:00 125 mg via INTRAVENOUS
  Filled 2021-11-02: qty 2

## 2021-11-02 MED ORDER — POTASSIUM CHLORIDE 10 MEQ/100ML IV SOLN
10.0000 meq | INTRAVENOUS | Status: AC
  Administered 2021-11-02 (×4): 10 meq via INTRAVENOUS
  Filled 2021-11-02 (×4): qty 100

## 2021-11-02 MED ORDER — FUROSEMIDE 10 MG/ML IJ SOLN
60.0000 mg | Freq: Two times a day (BID) | INTRAMUSCULAR | Status: DC
Start: 2021-11-02 — End: 2021-11-04
  Administered 2021-11-02 – 2021-11-04 (×5): 60 mg via INTRAVENOUS
  Filled 2021-11-02 (×5): qty 6

## 2021-11-02 NOTE — Consult Note (Addendum)
PHARMACY CONSULT NOTE - FOLLOW UP  Pharmacy Consult for Electrolyte Monitoring and Replacement   Recent Labs: Potassium (mmol/L)  Date Value  11/02/2021 3.4 (L)  04/07/2012 3.7   Magnesium (mg/dL)  Date Value  11/02/2021 2.0   Calcium (mg/dL)  Date Value  11/02/2021 8.8 (L)   Calcium, Total (mg/dL)  Date Value  04/07/2012 8.6   Albumin (g/dL)  Date Value  10/31/2021 3.8  04/07/2012 3.6   Phosphorus (mg/dL)  Date Value  11/02/2021 4.5   Sodium (mmol/L)  Date Value  11/02/2021 142  04/07/2012 141     Assessment: 80yo male with PMH of DVT, Ischemic Colitis, NICM (nonischemic cardiomyopathy), AKI (acute kidney injury), Chronic anticoagulation, HFrEF, Essential hypertension, ICD in place, Atrial fibrillation, Benign prostatic hyperplasia who was admitted to the hospital for SOB. Pharmacy was consulted for electrolyte replacement and monitoring.  Diuresis: Lasix 60 mg IV BID   Goal of Therapy:  Electrolytes WNL  Plan:  --Remains NPO --Patient started on diuresis today --Potassium 10 mEq IV x 4 given ongoing diuresis, although pt not responding significantly to the dose of Lasix this morning --Will continue to monitor and replace electrolytes as clinically indicated   Tawnya Crook, PharmD, BCPS Clinical Pharmacist 11/02/2021 2:38 PM

## 2021-11-02 NOTE — Progress Notes (Signed)
Patient has had intermittent stridor, more prominent on right side of neck, independent of ice chip administration. Per patient family this is chronic finding but may be exacerbated now. PRN neb given per NP with no improvement. NP made aware.

## 2021-11-02 NOTE — Consult Note (Addendum)
CARDIOLOGY CONSULT NOTE               Patient ID: Adrian Chavez MRN: [id number] DOB/AGE: 78/05/1941 78 y.o.  Admit date: 10/31/2021 Referring Physician Dr. Nelle Don Primary Physician Dr. Dion Body Primary Cardiologist Dr. 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  Patient seen and examined with son at bedside in ICU.  Son is the primary historian during the encounter and states that for the past 2 weeks the patient has been increasingly weak and becoming progressively more short of breath.  Per nursing staff and the patient's son it is unclear if the patient has been compliant with his diuretic therapy at home.  Patient has overall not feeling well and has a chronic cough and also vomited for 2 days prior to presentation in the ED. he is normally very active and was going to the gym and working out/personal training others 1 to 2 months ago.  He has been unable to do his usual activities has been declining over the past 2 weeks per the patient's son.  The patient failed his swallow study yesterday however the patient is still requesting water to drink and ice chips.  Vital signs are significant for blood pressure 111/84 and is ventricular pacing at 80 bpm.  Labs today are significant for potassium 3.4, BUN 46, creatinine 2.29 (2.68 yesterday), EGFR 29, BNP on admission greater than 4500 and troponin 48, INR was supratherapeutic at 3.9 downtrending to 3.5 today.  Chest x-ray revealed trace left pleural effusion, moderate to severe cardiomegaly,   Suggesting congestive heart failure   Review of systems complete and found to be negative unless listed above     Past Medical History:  Diagnosis Date   Anxiety    CHF (congestive heart failure) (HCC)    Claustrophobia    Colitis, ischemic ([place]) 2010   developed DVT   DVT (deep venous thrombosis) ([place]) 2010   stomach and leg   Dysrhythmia    A-Fib   Hypertension     Past Surgical History:  Procedure Laterality Date   HERNIA REPAIR Right 2007   IMPLANTABLE CARDIOVERTER DEFIBRILLATOR (ICD) GENERATOR CHANGE Left 02/21/2016   Procedure: ICD GENERATOR CHANGE;  Surgeon: Isaias Cowman, MD;  Location: [facility] ORS;  Service: Cardiovascular;  Laterality: Left;   PACEMAKER INSERTION     PITUITARY SURGERY  2002   Done at [facility]    Medications Prior to Admission  Medication Sig Dispense Refill Last Dose   albuterol (VENTOLIN HFA) 108 (90 Base) MCG/ACT inhaler SMARTSIG:1-2 Inhalation Via Inhaler Every 4 Hours PRN      ALPRAZolam (XANAX) 0.5 MG tablet Take 0.5 mg by mouth daily as needed for anxiety.       amiodarone (PACERONE) 200 MG tablet Take 200 mg by mouth 2 (two) times daily.      azelastine (ASTELIN) 0.1 % nasal spray Place into the nose.      carvedilol (COREG) 3.125 MG tablet Take 3.125 mg by mouth 2 (two) times daily.      cetirizine (ZYRTEC) 10 MG tablet Take 1 tablet by mouth daily.      CIALIS 5  MG tablet Take 5 mg by mouth daily.       ENTRESTO 49-51 MG Take 1 tablet by mouth 2 (two) times daily.      finasteride (PROSCAR) 5 MG tablet Take 1 tablet (5 mg total) by mouth daily. 90 tablet 3    fluticasone (FLONASE) 50 MCG/ACT nasal spray Place 2 sprays into both nostrils daily as needed for allergies or rhinitis.      metolazone (ZAROXOLYN) 5 MG tablet Take 5 mg by mouth daily.      potassium chloride SA (K-DUR,KLOR-CON) 20 MEQ tablet Take 20 mEq by mouth 2 (two) times daily.      sertraline (ZOLOFT) 25 MG tablet Take 25 mg by mouth daily.      torsemide (DEMADEX) 20 MG  tablet Take 20 mg by mouth 2 (two) times daily.      vitamin B-12 (CYANOCOBALAMIN) 1000 MCG tablet Take 1,000 mcg by mouth daily.      warfarin (COUMADIN) 1 MG tablet Take 2 mg by mouth See admin instructions. Take 2 MG by mouth every day      Social History   Socioeconomic History   Marital status: Married    Spouse name: Not on file   Number of children: Not on file   Years of education: Not on file   Highest education level: Not on file  Occupational History   Not on file  Tobacco Use   Smoking status: Former    Types: Cigarettes    Quit date: 02/13/1974    Years since quitting: 47.7   Smokeless tobacco: Never  Substance and Sexual Activity   Alcohol use: No   Drug use: No   Sexual activity: Not on file  Other Topics Concern   Not on file  Social History Narrative   Not on file   Social Determinants of Health   Financial Resource Strain: Not on file  Food Insecurity: Not on file  Transportation Needs: Not on file  Physical Activity: Not on file  Stress: Not on file  Social Connections: Not on file  Intimate Partner Violence: Not on file    History reviewed. No pertinent family history.    Review of systems complete and found to be negative unless listed above    PHYSICAL EXAM General: Elderly and ill-appearing Caucasian male, in no acute distress.  Resting at angle against ICU bed wall with BiPAP in place  HEENT:  Normocephalic and atraumatic. Neck:  No JVD.  Lungs: Conversational dyspnea and tachypneic on BiPAP, O2 sat 100%.  Coarse breath sounds bilaterally to auscultation. Heart: Ventricularly paced at 80 bpm. Normal S1 and S2 without gallops or murmurs. Radial & DP pulses 2+ bilaterally.  Left chest with BiV AICD in place. Abdomen: distended appearing.  Msk: Normal strength and tone for age. Extremities: Edematous upper and lower extremities bilaterally. no clubbing, cyanosis. Neuro: Alert and oriented X 3. Psych:  Mood frustrated, affect congruent.    Labs:   Lab Results  Component Value Date   WBC 4.1 11/01/2021   HGB 13.2 11/01/2021   HCT 43.2 11/01/2021   MCV 94.5 11/01/2021   PLT 146 (L) 11/01/2021    Recent Labs  Lab 10/31/21 2008 11/01/21 0523 11/02/21 0342  NA 139   < > 142  K 3.2*   < > 3.4*  CL 98   < > 103  CO2 31   < > 30  BUN 56*   < > 46*  CREATININE 2.93*   < >  2.29*  CALCIUM 9.1   < > 8.8*  PROT 6.6  --   --   BILITOT 2.9*  --   --   ALKPHOS 92  --   --   ALT 52*  --   --   AST 57*  --   --   GLUCOSE 121*   < > 78   < > = values in this interval not displayed.   Lab Results  Component Value Date   CKTOTAL 142 04/07/2012   CKMB 2.5 04/07/2012   TROPONINI 0.04 (H) 09/08/2015   No results found for: CHOL No results found for: HDL No results found for: LDLCALC No results found for: TRIG No results found for: CHOLHDL No results found for: LDLDIRECT    Radiology: DG Chest 1 View  Result Date: 11/02/2021 CLINICAL DATA:  80 year old male with history of DVT. EXAM: CHEST  1 VIEW COMPARISON:  Chest x-ray 10/31/2021. FINDINGS: Lung volumes are normal. There is cephalization of the pulmonary vasculature and slight indistinctness of the interstitial markings suggestive of mild pulmonary edema. Trace left pleural effusion. No definite right pleural effusion. No pneumothorax. Moderate to severe cardiomegaly. The patient is rotated to the right on today's exam, resulting in distortion of the mediastinal contours and reduced diagnostic sensitivity and specificity for mediastinal pathology. Atherosclerotic calcifications in the thoracic aorta. Left-sided biventricular pacemaker/AICD with lead tips projecting over the expected location of the right atrium, right ventricle and lateral wall the left ventricle via the coronary sinus and coronary veins. IMPRESSION: 1. The appearance the chest suggest congestive heart failure, as above. Electronically Signed   By: Vinnie Langton M.D.   On: 11/02/2021 07:22   DG Abd 1  View  Result Date: 10/31/2021 CLINICAL DATA:  Abdominal pain. EXAM: ABDOMEN - 1 VIEW COMPARISON:  October 27, 2010 FINDINGS: Mild atelectatic changes are seen within the bilateral lung bases. The cardiac silhouette is markedly enlarged. The bowel gas pattern is normal. Multiple small radiopaque surgical coils are seen overlying the lower pelvis on the right. No radio-opaque calculi or other significant radiographic abnormality are seen. IMPRESSION: 1. Normal bowel gas pattern without evidence of renal calculi. 2. Mild bibasilar atelectasis. Electronically Signed   By: Virgina Norfolk M.D.   On: 10/31/2021 23:39   US Venous Img Upper Uni Left (DVT)  Result Date: 11/01/2021 CLINICAL DATA:  Bruising of left forearm status post fall 1 week ago Edema Pain EXAM: LEFT UPPER EXTREMITY VENOUS DOPPLER ULTRASOUND TECHNIQUE: Gray-scale sonography with graded compression, as well as color Doppler and duplex ultrasound were performed to evaluate the upper extremity deep venous system from the level of the subclavian vein and including the jugular, axillary, basilic, radial, ulnar and upper cephalic vein. Spectral Doppler was utilized to evaluate flow at rest and with distal augmentation maneuvers. COMPARISON:  None. FINDINGS: Contralateral Subclavian Vein: Respiratory phasicity is normal and symmetric with the symptomatic side. No evidence of thrombus. Normal compressibility. Internal Jugular Vein: No evidence of thrombus. Normal compressibility, respiratory phasicity and response to augmentation. Subclavian Vein: No evidence of thrombus. Normal compressibility, respiratory phasicity and response to augmentation. Axillary Vein: No evidence of thrombus. Normal compressibility, respiratory phasicity and response to augmentation. Cephalic Vein: No evidence of thrombus. Normal compressibility, respiratory phasicity and response to augmentation. Basilic Vein: Web-like thrombus in the proximal left basilic vein is consistent  with chronic DVT. Brachial Veins: No evidence of thrombus. Normal compressibility, respiratory phasicity and response to augmentation. Radial Veins: No evidence of thrombus. Normal compressibility, respiratory  phasicity and response to augmentation. Ulnar Veins: No evidence of thrombus. Normal compressibility, respiratory phasicity and response to augmentation. Venous Reflux:  None visualized. Other Findings:  None visualized. IMPRESSION: 1. No left lower extremity DVT. 2. Minimal partially occlusive web-like thrombus in the proximal left basilic vein is consistent with chronic superficial venous thrombosis. Electronically Signed   By: Miachel Roux M.D.   On: 11/01/2021 17:29   DG Chest Port 1 View  Result Date: 10/31/2021 CLINICAL DATA:  Questionable sepsis.  Evaluate for abnormality. EXAM: PORTABLE CHEST 1 VIEW COMPARISON:  03/05/2021 FINDINGS: Multi lead ICD is identified with battery pack in the left chest wall. Marked cardiac enlargement is unchanged. There is blunting of the left costophrenic angle, new from previous exam. This may represent a small effusion. Diffuse pulmonary vascular congestion. No frank edema or airspace consolidation. Platelike atelectasis is noted in the right lung base. IMPRESSION: 1. Cardiac enlargement, small left pleural effusion and pulmonary vascular congestion. Correlate for any signs or symptoms of CHF. 2. Right base atelectasis. Electronically Signed   By: Kerby Moors M.D.   On: 10/31/2021 20:26   DG Shoulder Left Port  Result Date: 11/01/2021 CLINICAL DATA:  Left shoulder pain EXAM: LEFT SHOULDER COMPARISON:  None. FINDINGS: Mild AC joint degenerative change. Moderate glenohumeral degenerative change. No fracture or dislocation. Faint calcifications at the superolateral humeral head. IMPRESSION: 1. No acute osseous abnormality 2. Moderate degenerative changes with calcific tendinopathy. Electronically Signed   By: Donavan Foil M.D.   On: 11/01/2021 16:10    ECHOCARDIOGRAM COMPLETE  Result Date: 11/01/2021    ECHOCARDIOGRAM REPORT   Patient Name:   Adrian Chavez Date of Exam: 11/01/2021 Medical Rec #:  [id number]    Height:       68.0 in Accession #:    [id number]   Weight:       184.7 lb Date of Birth:  Mar 21, 1941    BSA:          1.976 m Patient Age:    60 years     BP:           101/73 mmHg Patient Gender: M            HR:           78 bpm. Exam Location:  [facility] Procedure: 2D Echo, Cardiac Doppler and Color Doppler Indications:     CHF-acute diastolic X83.38                  CHF-acute systolic S50.53  History:         Patient has no prior history of Echocardiogram examinations.                  CHF; Risk Factors:Hypertension.  Sonographer:     Sherrie Sport Referring Phys:  [id number] [facility]UMA Diagnosing Phys: Donnelly Angelica  Sonographer Comments: Suboptimal apical window. IMPRESSIONS  1. Left ventricular ejection fraction, by estimation, is <20%. The left ventricle has severely decreased function. The left ventricle demonstrates regional wall motion abnormalities (see scoring diagram/findings for description). The left ventricular internal cavity size was mildly dilated. Left ventricular diastolic parameters are indeterminate. There is severe hypokinesis of the left ventricular, entire inferior wall and inferolateral wall.  2. Right ventricular systolic function reduced. The right ventricular size is not well visualized.  3. Left atrial size was severely dilated.  4. Right atrial size was severely dilated.  5. The mitral valve is grossly normal. Moderate mitral valve regurgitation.  6. The aortic valve is normal in structure. Aortic valve regurgitation is not visualized. Aortic valve sclerosis is present, with no evidence of aortic valve stenosis.  7. Mild pulmonic stenosis. FINDINGS  Left Ventricle: Left ventricular ejection fraction, by estimation, is <20%. The left ventricle has severely decreased function. The left ventricle demonstrates regional wall  motion abnormalities. Severe hypokinesis of the left ventricular, entire inferior wall and inferolateral wall. The left ventricular internal cavity size was mildly dilated. There is no left ventricular hypertrophy. Left ventricular diastolic parameters are indeterminate. Right Ventricle: The right ventricular size is not well visualized. Right vetricular wall thickness was not well visualized. Right ventricular systolic function reduced. Left Atrium: Left atrial size was severely dilated. Right Atrium: Right atrial size was severely dilated. Pericardium: There is no evidence of pericardial effusion. Mitral Valve: The mitral valve is grossly normal. Moderate mitral valve regurgitation. Tricuspid Valve: The tricuspid valve is normal in structure. Tricuspid valve regurgitation is not demonstrated. Aortic Valve: The aortic valve is normal in structure. Aortic valve regurgitation is not visualized. Aortic valve sclerosis is present, with no evidence of aortic valve stenosis. Aortic valve mean gradient measures 2.0 mmHg. Aortic valve peak gradient measures 3.4 mmHg. Aortic valve area, by VTI measures 2.47 cm. Pulmonic Valve: The pulmonic valve was not well visualized. Pulmonic valve regurgitation is not visualized. Mild pulmonic stenosis. Aorta: The aortic root is normal in size and structure. Venous: The inferior vena cava was not well visualized. IAS/Shunts: The interatrial septum was not well visualized.  LEFT VENTRICLE PLAX 2D LVIDd:         6.20 cm LVIDs:         5.70 cm LV PW:         1.40 cm LV IVS:        0.95 cm LVOT diam:     2.10 cm LV SV:         32 LV SV Index:   16 LVOT Area:     3.46 cm  LV Volumes (MOD) LV vol d, MOD A4C: 328.0 ml LV vol s, MOD A4C: 219.0 ml LV SV MOD A4C:     328.0 ml RIGHT VENTRICLE RV Basal diam:  4.70 cm RV S prime:     9.68 cm/s TAPSE (M-mode): 4.2 cm LEFT ATRIUM              Index         RIGHT ATRIUM           Index LA diam:        8.30 cm  4.20 cm/m    RA Area:     50.50 cm LA  Vol (A2C):   554.0 ml 280.37 ml/m  RA Volume:   230.00 ml 116.40 ml/m LA Vol (A4C):   339.0 ml 171.56 ml/m LA Biplane Vol: 439.0 ml 222.17 ml/m  AORTIC VALVE                    PULMONIC VALVE AV Area (Vmax):    2.39 cm     PV Vmax:        0.60 m/s AV Area (Vmean):   2.21 cm     PV Vmean:       37.450 cm/s AV Area (VTI):     2.47 cm     PV VTI:         0.081 m AV Vmax:           91.80 cm/s   PV Peak  grad:   1.4 mmHg AV Vmean:          62.100 cm/s  PV Mean grad:   1.0 mmHg AV VTI:            0.130 m      RVOT Peak grad: 3 mmHg AV Peak Grad:      3.4 mmHg AV Mean Grad:      2.0 mmHg LVOT Vmax:         63.30 cm/s LVOT Vmean:        39.600 cm/s LVOT VTI:          0.093 m LVOT/AV VTI ratio: 0.71  AORTA Ao Root diam: 3.10 cm MITRAL VALVE                TRICUSPID VALVE MV Area (PHT): 4.93 cm     TR Peak grad:   16.8 mmHg MV Decel Time: 154 msec     TR Vmax:        205.00 cm/s MV E velocity: 111.00 cm/s                             SHUNTS                             Systemic VTI:  0.09 m                             Systemic Diam: 2.10 cm                             Pulmonic VTI:  0.130 m Donnelly Angelica Electronically signed by Donnelly Angelica Signature Date/Time: 11/01/2021/12:52:46 PM    Final    [place] LT UPPER EXTREM LTD SOFT TISSUE NON VASCULAR  Result Date: 11/01/2021 CLINICAL DATA:  Left forearm bruise status post fall 1 week ago EXAM: ULTRASOUND LEFT UPPER EXTREMITY LIMITED TECHNIQUE: Ultrasound examination of the upper extremity soft tissues was performed in the area of clinical concern. COMPARISON:  None. FINDINGS: Targeted sonographic evaluation of the left forearm demonstrates a 1.5 x 0.6 x 1.0 cm complex fluid collection which is likely a hematoma. IMPRESSION: 1.5 x 0.6 x 1.0 cm complex fluid collection in the left forearm is likely a small hematoma. If patient's symptoms worsen, repeat ultrasound evaluation should be performed. Electronically Signed   By: Miachel Roux M.D.   On: 11/01/2021 17:30   CT HEAD CODE  STROKE WO CONTRAST`  Result Date: 11/01/2021 CLINICAL DATA:  Code stroke.  Facial droop and slurred speech EXAM: CT HEAD WITHOUT CONTRAST TECHNIQUE: Contiguous axial images were obtained from the base of the skull through the vertex without intravenous contrast. COMPARISON:  01/07/2015 FINDINGS: Brain: No evidence of acute infarction, hemorrhage, hydrocephalus, extra-axial collection or mass lesion/mass effect. Vascular: No hyperdense vessel or unexpected calcification. Skull: Normal. Negative for fracture or focal lesion. Sinuses/Orbits: No acute finding. Other: These results were communicated to Dr Stark Klein at 6:03 am on 11/01/2021 by text page via the [facility] messaging system. ASPECTS [facility] Stroke Program Early CT Score) - Ganglionic level infarction (caudate, lentiform nuclei, internal capsule, insula, M1-M3 cortex): 7 - Supraganglionic infarction (M4-M6 cortex): 3 Total score (0-10 with 10 being normal): 10 IMPRESSION: No acute finding.  No hemorrhage or visible infarct. Electronically Signed   By: Gilford Silvius.D.  On: 11/01/2021 06:05    ECHO 11/01/21 LVEF less than 20% with severely decreased function, severe hypokinesis of left ventricle, entire inferior wall and inferolateral wall.  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%) s/p BiV ICD generator change out 02/21/2016, permanent atrial fibrillation on warfarin for anticoagulation, OSA, hypertension, history of DVT who [facility] ED 12/28 with chief complaint of progressive shortness of breath and weakness x2 weeks  #Acute on chronic HFrEF LVEF less than 20% 10/2021 s/p BiV ICD #Cardiogenic shock #Nonischemic dilated cardiomyopathy Patient was weaned off of Levophed and blood pressure during interview  111/84 Ordered IV Lasix 60 mg twice daily to assist with diuresis as patient appears significantly volume overloaded on exam Holding home Entresto, Coreg, metolazone and torsemide as patient is not tolerating p.o., consider reinitiation as renal function and blood pressure allow  #Acute hypoxic respiratory failure 2/2 suspected volume overload and possible pneumonia Antibiotics and respiratory support per primary team  #Permanent atrial fibrillation on warfarin for anticoagulation; s/p AV node ablation Holding amiodarone, patient is ventricular paced Warfarin supratherapeutic 3.5 today, appreciate pharmacy assistance with dosing  #AKI on CKD Creatinine downtrending 2.29 Continue to monitor, replete electrolytes as needed  #Hypertension #History of DVT As above  This patient was seen and examined by Dr. Donnelly Angelica and he is in agreement with this plan of care.   Signed: Tristan Schroeder , PA-C 11/02/2021, 7:49 AM  I saw and evaluated the patient with Renelda Loma, PA-C, and agree with her assessment and plan.  Briefly this is a patient with known severe cardiomyopathy with an EF of less than 20% status post BiV ICD placement.  He is admitted with congestive heart failure, acute hypoxic respiratory failure, and confusion.  He seems to be improving with diuresis.  We will administer IV Lasix and reassess response.  GDMT is on hold because of hypotension; patient presented with shock and is since been titrated off Levophed.  Andrez Grime, MD

## 2021-11-02 NOTE — Consult Note (Signed)
[place] for warfarin  Indication: atrial fibrillation and LV thrombus  Allergies  Allergen Reactions   Escitalopram Oxalate Nausea Only   Oxycodone     vomiting    Patient Measurements: Height: 5\' 8"  (172.7 cm) Weight: 86.1 kg (189 lb 13.1 oz) IBW/kg (Calculated) : 68.4  Vital Signs: Temp: 99.4 F (37.4 C) (12/30 0727) Temp Source: Axillary (12/30 0727) BP: 100/77 (12/30 1400) Pulse Rate: 80 (12/30 1400)  Labs: Recent Labs    10/31/21 2008 10/31/21 2028 10/31/21 2146 11/01/21 0523 11/02/21 0342  HGB 14.6  --   --  13.2  --   HCT 48.0  --   --  43.2  --   PLT 174  --   --  146*  --   APTT  --   --  45*  --   --   LABPROT  --   --  38.2* 38.2* 35.0*  INR  --   --  3.9* 3.9* 3.5*  CREATININE 2.93*  --   --  2.68* 2.29*  TROPONINIHS  --  48*  --   --   --      Estimated Creatinine Clearance: 28.4 mL/min (A) (by C-G formula based on SCr of 2.29 mg/dL (H)).   Medical History: Past Medical History:  Diagnosis Date   Anxiety    CHF (congestive heart failure) ([place])    Claustrophobia    Colitis, ischemic ([place]) 2010   developed DVT   DVT (deep venous thrombosis) ([place]) 2010   stomach and leg   Dysrhythmia    A-Fib   Hypertension      Assessment: 80yo male with PMH of DVT, Ischemic Colitis, NICM (nonischemic cardiomyopathy), AKI (acute kidney injury), Chronic anticoagulation, Chronic systolic CHF, NYHA class 2, Essential hypertension, ICD in place, Atrial fibrillation, Benign prostatic hyperplasia who was admitted to the hospital for SOB. Pharmacy was consulted for warfarin dosing and monitoring.  PTA regimen: 2mg  daily (TWD=14 mg) (confirmed with PCP)  Last dose: 12/27  Date/time INR Warfarin Dose 12/28 2146 3.9 HOLD 12/29 0523 3.9 HOLD 12/30 0342 3.5 HOLD  Goal of Therapy:  INR 2-3 Monitor platelets by anticoagulation protocol: Yes   Plan:  --INR supratherapeutic, but is now trending down --Hold warfarin again  tonight given supratherapeutic INR and patient NPO --Continue to assess for ability to take PO vs need for parenteral anticoagulation once indicated --Check INR with AM labs  Tawnya Crook, PharmD, BCPS Clinical Pharmacist 11/02/2021 2:44 PM

## 2021-11-02 NOTE — Progress Notes (Signed)
NAME:  Adrian Chavez, MRN:  [id number], DOB:  03/06/1941, LOS: 2 ADMISSION DATE:  10/31/2021, CONSULTATION DATE:  10/31/2021 REFERRING MD:  Duffy Bruce MD  CHIEF COMPLAINT: Shortness of Breath   SYNOPSIS  80 y.o male  with significant PMH of DVT, Ischemic Colitis, NICM (nonischemic cardiomyopathy), AKI (acute kidney injury), Chronic anticoagulation, History of 2019 novel coronavirus disease ([id number]), Chronic systolic CHF (congestive heart failure), NYHA class 2, Essential hypertension, ICD (implantable cardioverter-defibrillator) in place, Atrial fibrillation, Benign prostatic hyperplasia with lower urinary tract symptoms who presented to the ED with chief complaints of progressive shortness of breath.  Per family members at the bedside, EMS was called for complaints of progressive SOB. The family members reported that the patient has been more weak for a week due to nausea, vomiting, and diarrhea. No reports of fever, chills, chest pain, abdominal pain, or cough. On EMS arrival, patient was already on NRB, he was hypotensive, but alert and oriented. After IV access was obtained, responders began administering fluid and placing the patient on the stair chair. Enroute the patient  received IV fluids. The patient remained alert and oriented, but became lethargic upon arrival to the hospital.   ED Course: In the emergency department, the temperature was 36.1C, the heart rate 80 beats/minute, the blood pressure 98/85  mm Hg, the respiratory rate 19 breaths/minute, and the oxygen saturation 96% on 15% NRB. He was lethargic, and responded with one-word answers; symmetric movement in the arms and legs was observed. An electrocardiogram showed ventricular paced rhythm with rate 60 and prolonged Qtc.   Pertinent Labs in Red/Diagnostics Findings: Na+/ K+: 139/3.2 Glucose: 121 BUN/Cr.: 56/2.93 AST/ALT: 57/52  WBC: 3.9 PCT: <0.10 Lactic acid: 1.2 COVID PCR: Negative   Troponin:  48 BNP:>4500 Arterial Blood Gas result:  pO2 300; pCO2 67; pH 7.29;  HCO3 32.2, %O2 Sat 99.9.  Patient was placed on BiPAP due to VBG finding concerning for worsening hypoxia and hypercapnea. Patient received additional 500 cc IV fluids bolus and started on broad spectrum antibiotics for suspected infectious process. Due to high risk for decompensation and repeat abnormal ABG findings, PCCM ask to admit to ICU for further management.    Past Medical History   DVT   Ischemic Colitis 03/05/2021  NICM (nonischemic cardiomyopathy) ([place]) 03/05/2021  AKI (acute kidney injury) ([place]) 03/05/2021  Chronic anticoagulation 03/05/2021  History of 2019 novel coronavirus disease (COVID-19) 29/52/8413  Chronic systolic CHF (congestive heart failure), NYHA class 2 ([place]) 02/26/2018  Essential hypertension 05/17/2014  ICD (implantable cardioverter-defibrillator) in place 05/17/2014  Atrial fibrillation ([place]) 03/03/2014  Benign prostatic hyperplasia with lower urinary tract symptoms 07/10/2012     INTERVAL CHANGES Signs of aspiration Severe resp distress On biPAP/CPAP Advanced HF      Significant Hospital Events   10/31/21> Admitted to ICU with acute hypoxic hypercapnic respiratory failure 12/30 remains on biPAP, severe Hypoxic resp failure  Consults:  Cardiology  Procedures:  None  Significant Diagnostic Tests:  12/28: Chest Xray>Cardiac enlargement, small left pleural effusion and pulmonary vascular congestion. Correlate for any signs or symptoms of CHF. 2. Right base atelectasis. 12/28: Abdominal xray>  Micro Data:  12/28: SARS-CoV-2 PCR> negative 12/28: Influenza PCR> negative 12/28: Blood culture x2> 12/28: Urine Culture> 12/28: MRSA PCR>>  12/28: Strep pneumo urinary antigen> 12/28: Legionella urinary antigen>  Antimicrobials:  Vancomycin 12/28 x 1 Cefepime 12/28 x 1 Metronidazole 12/28 x 1   OBJECTIVE  Blood pressure 110/76, pulse 80, temperature 99.4 F (37.4 C),  temperature source Axillary,  resp. rate 16, height 5\' 8"  (1.727 m), weight 84.7 kg, SpO2 100 %.    FiO2 (%):  [28 %] 28 %   Intake/Output Summary (Last 24 hours) at 11/02/2021 0809 Last data filed at 11/02/2021 8299 Gross per 24 hour  Intake 1356.74 ml  Output 1710 ml  Net -353.26 ml    Filed Weights   11/01/21 0000 11/01/21 0410 11/02/21 0500  Weight: 84.7 kg 83.8 kg 84.7 kg     Review of Systems: +SOB, on CPAP Alert and awake Other:  All other systems negative     REVIEW OF SYSTEMS  PATIENT IS UNABLE TO PROVIDE COMPLETE REVIEW OF SYSTEMS DUE TO SEVERE CRITICAL ILLNESS AND TOXIC METABOLIC ENCEPHALOPATHY    PHYSICAL EXAMINATION:  GENERAL:critically ill appearing, +resp distress EYES: Pupils equal, round, reactive to light.  No scleral icterus.  MOUTH: Moist mucosal membrane. NECK: Supple.  PULMONARY: +rhonchi,  CARDIOVASCULAR: S1 and S2.  No murmurs  GASTROINTESTINAL: Soft, nontender, -distended. Positive bowel sounds.  MUSCULOSKELETAL: No swelling, clubbing, or edema.  NEUROLOGIC: alert and awake SKIN:intact,warm,dry    Labs/imaging that I havepersonally reviewed  (right click and "Reselect all SmartList Selections" daily)  I, personally viewed and interpreted this ECG. EKG Interpretation Date: 10/31/2021 V.paced. rate 60 BPM PR interval * ms QRS duration 176 ms QT/QTcB 554/554 ms P-R-T axes * -87 90   Labs   CBC: Recent Labs  Lab 10/31/21 2008 11/01/21 0523  WBC 3.9* 4.1  NEUTROABS 2.1  --   HGB 14.6 13.2  HCT 48.0 43.2  MCV 95.8 94.5  PLT 174 146*     Basic Metabolic Panel: Recent Labs  Lab 10/31/21 2008 11/01/21 0523 11/01/21 1818 11/02/21 0342  NA 139 139 141 142  K 3.2* 3.0* 3.0* 3.4*  CL 98 100 101 103  CO2 31 31 28 30   GLUCOSE 121* 84  --  78  BUN 56* 54*  --  46*  CREATININE 2.93* 2.68*  --  2.29*  CALCIUM 9.1 8.7*  --  8.8*  MG  --  2.3  --  2.0  PHOS  --  4.9*  --  4.5    GFR: Estimated Creatinine Clearance: 28.2  mL/min (A) (by C-G formula based on SCr of 2.29 mg/dL (H)). Recent Labs  Lab 10/31/21 2008 10/31/21 2028 10/31/21 2354 11/01/21 0523  PROCALCITON  --  <0.10  --   --   WBC 3.9*  --   --  4.1  LATICACIDVEN 1.7  --  1.6  --      Liver Function Tests: Recent Labs  Lab 10/31/21 2008  AST 57*  ALT 52*  ALKPHOS 92  BILITOT 2.9*  PROT 6.6  ALBUMIN 3.8    No results for input(s): LIPASE, AMYLASE in the last 168 hours. No results for input(s): AMMONIA in the last 168 hours.  ABG    Component Value Date/Time   PHART 7.29 (L) 10/31/2021 2211   PCO2ART 67 (HH) 10/31/2021 2211   PO2ART 300 (H) 10/31/2021 2211   HCO3 32.2 (H) 10/31/2021 2211   O2SAT 99.9 10/31/2021 2211      Coagulation Profile: Recent Labs  Lab 10/31/21 2146 11/01/21 0523 11/02/21 0342  INR 3.9* 3.9* 3.5*     Cardiac Enzymes: No results for input(s): CKTOTAL, CKMB, CKMBINDEX, TROPONINI in the last 168 hours.  HbA1C: No results found for: HGBA1C  CBG:  Assessment & Plan:   Acute Hypoxic Hypercapnic Respiratory Failure secondary to Pulmonary Edema and severe acute heart failure with  acute renal failure c/w progressive cardiorenal syndome -Supplemental O2 as needed to maintain O2 saturations 88 to 92% Wean to high flow [place] CPAP as needed    Acute on Chronic Systolic CHF (Last known LVEF 15-20%) Cardiogenic Shock Known GB:TDVV, HTN, HLD, OSA, PE Advanced cardiac failure Cardiology consulted  Permanent Atrial Fibrillation -He remains rate controlled and vpaced -Hold Amiodarone for now, Qtc prolonged  and unable to tolerate po -CHADS2VASc  -Patient on Coumadin at home, PT/INR supra-therapeutic, hold anticoagulation. pharmacy consult for management.   AKI on CKD Hypokalemia -Monitor I&O's / urinary output -Follow BMP -Ensure adequate renal perfusion -Avoid nephrotoxic agents as able -Replace electrolytes as indicated -Consider Nephrology consult pending next group of renal  indices  Best practice:  Diet:  NPO Pain/Anxiety/Delirium protocol (if indicated): No VAP protocol (if indicated): Not indicated DVT prophylaxis: Contraindicated GI prophylaxis: PPI Glucose control:  SSI No Central venous access:  N/A Arterial line:  N/A Foley:  Yes, and it is still needed Mobility:  bed rest  PT consulted: N/A Last date of multidisciplinary goals of care discussion [12/28] Code Status:  DNR Disposition: ICU    DVT/GI PRX  assessed I Assessed the need for Labs I Assessed the need for Foley I Assessed the need for Central Venous Line Family Discussion when available I Assessed the need for Mobilization I made an Assessment of medications to be adjusted accordingly Safety Risk assessment completed  CASE DISCUSSED IN MULTIDISCIPLINARY ROUNDS WITH ICU TEAM     Critical Care Time devoted to patient care services described in this note is 50 minutes.  Critical care was necessary to treat /prevent imminent and life-threatening deterioration. Overall, patient is critically ill, prognosis is guarded.  Patient with Multiorgan failure and at high risk for cardiac arrest and death.    Corrin Parker, M.D.  [facility]  Medical Director [place] Director [facility] Cardio-Pulmonary Department

## 2021-11-02 NOTE — TOC Initial Note (Signed)
Transition of Care [facility]) - Initial/Assessment Note    Patient Details  Name: Adrian Chavez MRN: [id number] Date of Birth: 1941/09/05  [facility] CM/SW Contact:    Shelbie Hutching, RN Phone Number: 11/02/2021, 6:53 PM  Clinical Narrative:                 Patient admitted to the hospital with acute on chronic respiratory failure and CHF.  Patient was on bipap this morning now on [place]. RNCM met with patient and his son and wife at the bedside this morning.  Patient is from home with wife.  He is independent and still works as a Physiological scientist at [employer].  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  Patient is current with his PCP and his Cardiologist Dr. Lorinda Creed.  Patient's son thinks that he needs oxygen at home.  Family and patient will agree with rehab if needed at discharge.    TOC will follow.   Expected Discharge Plan:  (TBD) Barriers to Discharge: Continued Medical Work up   Patient Goals and CMS Choice Patient states their goals for this hospitalization and ongoing recovery are:: Patient on Bipap- son thinks he needs oxygen at home      Expected Discharge Plan and Services Expected Discharge Plan:  (TBD)   Discharge Planning Services: CM Consult   Living arrangements for the past 2 months: Single Family Home                 DME Arranged: N/A DME Agency: NA                  Prior Living Arrangements/Services Living arrangements for the past 2 months: Single Family Home Lives with:: Spouse Patient language and need for interpreter reviewed:: Yes Do you feel safe going back to the place where you live?: Yes      Need for Family Participation in Patient Care: Yes (Comment) Care giver support system in place?: Yes (comment) (wife and son)   Criminal Activity/Legal Involvement Pertinent to Current Situation/Hospitalization: No - Comment as needed  Activities of Daily  Living Home Assistive Devices/Equipment: CPAP ADL Screening (condition at time of admission) Patient's cognitive ability adequate to safely complete daily activities?: No Is the patient deaf or have difficulty hearing?: No Does the patient have difficulty seeing, even when wearing glasses/contacts?: No Does the patient have difficulty concentrating, remembering, or making decisions?: No Patient able to express need for assistance with ADLs?: Yes Does the patient have difficulty dressing or bathing?: Yes Independently performs ADLs?: No Communication: Independent Dressing (OT): Needs assistance Is this a change from baseline?: Change from baseline, expected to last <3days Grooming: Needs assistance Is this a change from baseline?: Change from baseline, expected to last <3 days Feeding: Needs assistance Is this a change from baseline?: Change from baseline, expected to last <3 days Bathing: Needs assistance Is this a change from baseline?: Change from baseline, expected to last <3 days Toileting: Needs assistance Is this a change from baseline?: Change from baseline, expected to last <3 days In/Out Bed: Needs assistance Is this a change from baseline?: Change from baseline, expected to last <3 days Walks in Home: Needs assistance Is this a change from baseline?: Change from baseline, expected to last <3 days Does the patient have difficulty walking or climbing stairs?: Yes Weakness of Legs: Both Weakness of Arms/Hands: Both  Permission  Sought/Granted Permission sought to share information with : Case Manager, Family Supports, Other (comment) Permission granted to share information with : Yes, Verbal Permission Granted  Share Information with NAME: Tajh Livsey     Permission granted to share info w Relationship: [phone] spouse     Emotional Assessment   Attitude/Demeanor/Rapport: Lethargic Affect (typically observed): Accepting Orientation: : Oriented to Self, Oriented to  Place, Oriented to  Time, Oriented to Situation Alcohol / Substance Use: Not Applicable Psych Involvement: No (comment)  Admission diagnosis:  Abdominal pain [R10.9] Acute on chronic respiratory failure with hypoxia and hypercapnia (HCC) [H60.73, J96.22] Patient Active Problem List   Diagnosis Date Noted   Acute on chronic respiratory failure with hypoxia and hypercapnia ([place]) 10/31/2021   Acute renal failure (ARF) ([place]) 03/06/2021   Acute urinary retention 03/05/2021   NICM (nonischemic cardiomyopathy) ([place]) 03/05/2021   AKI (acute kidney injury) ([place]) 03/05/2021   Chronic anticoagulation 03/05/2021   History of 2019 novel coronavirus disease (COVID-19) 71/04/2693   Chronic systolic CHF (congestive heart failure), NYHA class 2 ([place]) 02/26/2018   Essential hypertension 05/17/2014   ICD (implantable cardioverter-defibrillator) in place 05/17/2014   Atrial fibrillation ([place]) 03/03/2014   Benign prostatic hyperplasia with lower urinary tract symptoms 07/10/2012   PCP:  Dion Body, MD Pharmacy:   [id number] [place] [place] [id number]: [phone]: [phone]    Social Determinants of Health (SDOH) Interventions    Readmission Risk Interventions No flowsheet data found.

## 2021-11-02 NOTE — Progress Notes (Signed)
SLP Cancellation Note  Patient Details Name: ROC STREETT MRN: [id number] DOB: 06/21/1941   Cancelled treatment:       Reason Eval/Treat Not Completed: Patient not medically ready;Medical issues which prohibited therapy (chart reviewed). Per observation and chart review, pt had declined Pulmonary status this AM and was placed on BiPAP for support; he remains on BiPAP per MD/NSG d/t concern for worsening hypoxia and hypercapnea.  ST services will f/u tomorrow monitoring status for appropriateness for po trials in hopes to establish least restrictive oral diet consistency. Recommend frequent oral care for hygiene and stimulation of swallowing.     Orinda Kenner, MS, CCC-SLP Speech Language Pathologist Rehab Services [phone] [facility] 11/02/2021, 11:32 AM

## 2021-11-03 ENCOUNTER — Encounter: Payer: Self-pay | Admitting: Pulmonary Disease

## 2021-11-03 DIAGNOSIS — N1832 Chronic kidney disease, stage 3b: Secondary | ICD-10-CM | POA: Diagnosis present

## 2021-11-03 DIAGNOSIS — I340 Nonrheumatic mitral (valve) insufficiency: Secondary | ICD-10-CM | POA: Diagnosis present

## 2021-11-03 DIAGNOSIS — I272 Pulmonary hypertension, unspecified: Secondary | ICD-10-CM | POA: Diagnosis present

## 2021-11-03 DIAGNOSIS — G4733 Obstructive sleep apnea (adult) (pediatric): Secondary | ICD-10-CM | POA: Diagnosis present

## 2021-11-03 LAB — CBC
HCT: 45.1 % (ref 39.0–52.0)
Hemoglobin: 13.8 g/dL (ref 13.0–17.0)
MCH: 28.9 pg (ref 26.0–34.0)
MCHC: 30.6 g/dL (ref 30.0–36.0)
MCV: 94.4 fL (ref 80.0–100.0)
Platelets: 142 10*3/uL — ABNORMAL LOW (ref 150–400)
RBC: 4.78 MIL/uL (ref 4.22–5.81)
RDW: 17.5 % — ABNORMAL HIGH (ref 11.5–15.5)
WBC: 5.7 10*3/uL (ref 4.0–10.5)
nRBC: 0 % (ref 0.0–0.2)

## 2021-11-03 LAB — BASIC METABOLIC PANEL
Anion gap: 11 (ref 5–15)
BUN: 52 mg/dL — ABNORMAL HIGH (ref 8–23)
CO2: 28 mmol/L (ref 22–32)
Calcium: 8.8 mg/dL — ABNORMAL LOW (ref 8.9–10.3)
Chloride: 104 mmol/L (ref 98–111)
Creatinine, Ser: 2.21 mg/dL — ABNORMAL HIGH (ref 0.61–1.24)
GFR, Estimated: 30 mL/min — ABNORMAL LOW (ref >60)
Glucose, Bld: 123 mg/dL — ABNORMAL HIGH (ref 70–99)
Potassium: 4 mmol/L (ref 3.5–5.1)
Sodium: 143 mmol/L (ref 135–145)

## 2021-11-03 LAB — BRAIN NATRIURETIC PEPTIDE: B Natriuretic Peptide: >4500 pg/mL — ABNORMAL HIGH (ref 0.0–100.0)

## 2021-11-03 LAB — PHOSPHORUS: Phosphorus: 4.1 mg/dL (ref 2.5–4.6)

## 2021-11-03 LAB — PROTIME-INR
INR: 4.1 (ref 0.8–1.2)
Prothrombin Time: 39.4 seconds — ABNORMAL HIGH (ref 11.4–15.2)

## 2021-11-03 LAB — MAGNESIUM: Magnesium: 2 mg/dL (ref 1.7–2.4)

## 2021-11-03 MED ORDER — AMIODARONE HCL 200 MG PO TABS
200.0000 mg | ORAL_TABLET | Freq: Every day | ORAL | Status: DC
  Administered 2021-11-03 – 2021-11-08 (×6): 200 mg via ORAL
  Filled 2021-11-03 (×6): qty 1

## 2021-11-03 MED ORDER — ORAL CARE MOUTH RINSE
15.0000 mL | Freq: Two times a day (BID) | OROMUCOSAL | Status: DC
  Administered 2021-11-03 – 2021-11-04 (×4): 15 mL via OROMUCOSAL

## 2021-11-03 MED ORDER — CHLORHEXIDINE GLUCONATE 0.12 % MT SOLN
15.0000 mL | Freq: Two times a day (BID) | OROMUCOSAL | Status: DC
  Administered 2021-11-03 – 2021-11-05 (×5): 15 mL via OROMUCOSAL
  Filled 2021-11-03 (×3): qty 15

## 2021-11-03 NOTE — Consult Note (Signed)
PHARMACY CONSULT NOTE - FOLLOW UP  Pharmacy Consult for Electrolyte Monitoring and Replacement   Recent Labs: Potassium (mmol/L)  Date Value  11/03/2021 4.0  04/07/2012 3.7   Magnesium (mg/dL)  Date Value  11/03/2021 2.0   Calcium (mg/dL)  Date Value  11/03/2021 8.8 (L)   Calcium, Total (mg/dL)  Date Value  04/07/2012 8.6   Albumin (g/dL)  Date Value  10/31/2021 3.8  04/07/2012 3.6   Phosphorus (mg/dL)  Date Value  11/03/2021 4.1   Sodium (mmol/L)  Date Value  11/03/2021 143  04/07/2012 141     Assessment: 80yo male with PMH of DVT, Ischemic Colitis, NICM (nonischemic cardiomyopathy), AKI (acute kidney injury), Chronic anticoagulation, HFrEF, Essential hypertension, ICD in place, Atrial fibrillation, Benign prostatic hyperplasia who was admitted to the hospital for SOB. Pharmacy was consulted for electrolyte replacement and monitoring.  Diuresis: Lasix 60 mg IV BID   Goal of Therapy:  Electrolytes WNL  Plan:  --Remains NPO --no replacement warranted currently. --Will continue to monitor and replace electrolytes as clinically indicated   Pearla Dubonnet, PharmD Clinical Pharmacist 11/03/2021 8:21 AM

## 2021-11-03 NOTE — Care Plan (Signed)
[facility] Transfer Accept Note  80 y.o. 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   In the ER, dyspneic, SpO2 70%, hypotensive.  Cr 2.93 from baseline 1.6, BNP >4500, pH 7.29, pCO2 67.  Placed on BiPAP, given empiric fluids and antibiotics and admitted to ICU.  12/28: Admitted to ICU on BiPAP 12/30: Remained on BiPAP, but improving, Cardiology consulted for diuresis 12/31: Stable for transfer to SDU, remains on IV Lasix, off antibiotics

## 2021-11-03 NOTE — Consult Note (Signed)
[place] for warfarin  Indication: atrial fibrillation and LV thrombus  Allergies  Allergen Reactions   Escitalopram Oxalate Nausea Only   Oxycodone     vomiting    Patient Measurements: Height: 5\' 8"  (172.7 cm) Weight: 86.1 kg (189 lb 13.1 oz) IBW/kg (Calculated) : 68.4  Vital Signs: Temp: 98.4 F (36.9 C) (12/31 0757) Temp Source: Oral (12/31 0757) BP: 107/76 (12/31 0700) Pulse Rate: 85 (12/31 0718)  Labs: Recent Labs    10/31/21 2008 10/31/21 2028 10/31/21 2146 10/31/21 2146 11/01/21 0523 11/02/21 0342 11/03/21 0348  HGB 14.6  --   --   --  13.2  --  13.8  HCT 48.0  --   --   --  43.2  --  45.1  PLT 174  --   --   --  146*  --  142*  APTT  --   --  45*  --   --   --   --   LABPROT  --   --  38.2*   < > 38.2* 35.0* 39.4*  INR  --   --  3.9*   < > 3.9* 3.5* 4.1*  CREATININE 2.93*  --   --   --  2.68* 2.29* 2.21*  TROPONINIHS  --  48*  --   --   --   --   --    < > = values in this interval not displayed.     Estimated Creatinine Clearance: 29.4 mL/min (A) (by C-G formula based on SCr of 2.21 mg/dL (H)).   Medical History: Past Medical History:  Diagnosis Date   Anxiety    CHF (congestive heart failure) ([place])    Claustrophobia    Colitis, ischemic ([place]) 2010   developed DVT   DVT (deep venous thrombosis) ([place]) 2010   stomach and leg   Dysrhythmia    A-Fib   Hypertension      Assessment: 80yo male with PMH of DVT, Ischemic Colitis, NICM (nonischemic cardiomyopathy), AKI (acute kidney injury), Chronic anticoagulation, Chronic systolic CHF, NYHA class 2, Essential hypertension, ICD in place, Atrial fibrillation, Benign prostatic hyperplasia who was admitted to the hospital for SOB. Pharmacy was consulted for warfarin dosing and monitoring.  PTA regimen: 2mg  daily (TWD=14 mg) (confirmed with PCP)  Last dose: 12/27  Date/time INR Warfarin Dose 12/28   3.9 HOLD 12/29   3.9 HOLD 12/30   3.5 HOLD 12/31    4.0 HOLD  Goal of Therapy:  INR 2-3 Monitor platelets by anticoagulation protocol: Yes   Plan:  --INR supratherapeutic again today --Hold warfarin again tonight given supratherapeutic INR and patient NPO --Continue to assess for ability to take PO vs need for parenteral anticoagulation once indicated --Check INR with AM labs  Pearla Dubonnet, PharmD Clinical Pharmacist 11/03/2021 8:24 AM

## 2021-11-03 NOTE — Hospital Course (Addendum)
80 y.o. 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   In the ER, dyspneic, SpO2 70%, hypotensive.  Cr 2.93 from baseline 1.6, BNP >4500, pH 7.29, pCO2 67.  Placed on BiPAP, given empiric fluids and antibiotics and admitted to ICU.  12/28: Admitted to ICU on BiPAP 12/30: Remained on BiPAP, but improving, Cardiology consulted for diuresis 12/31: Stable for transfer to SDU, remains on IV Lasix, off antibiotics

## 2021-11-03 NOTE — Progress Notes (Signed)
NAME:  Adrian Chavez, MRN:  [id number], DOB:  [birth year], LOS: 3 ADMISSION DATE:  10/31/2021, CONSULTATION DATE:  10/31/2021 REFERRING MD:  Duffy Bruce MD  CHIEF COMPLAINT: Shortness of Breath   SYNOPSIS  80 y.o male  with significant PMH of DVT, Ischemic Colitis, NICM (nonischemic cardiomyopathy), AKI (acute kidney injury), Chronic anticoagulation, History of 2019 novel coronavirus disease ([id number]), Chronic systolic CHF (congestive heart failure), NYHA class 2, Essential hypertension, ICD (implantable cardioverter-defibrillator) in place, Atrial fibrillation, Benign prostatic hyperplasia with lower urinary tract symptoms who presented to the ED with chief complaints of progressive shortness of breath.  Per family members at the bedside, EMS was called for complaints of progressive SOB. The family members reported that the patient has been more weak for a week due to nausea, vomiting, and diarrhea. No reports of fever, chills, chest pain, abdominal pain, or cough. On EMS arrival, patient was already on NRB, he was hypotensive, but alert and oriented. After IV access was obtained, responders began administering fluid and placing the patient on the stair chair. Enroute the patient  received IV fluids. The patient remained alert and oriented, but became lethargic upon arrival to the hospital.   ED Course: In the emergency department, the temperature was 36.1C, the heart rate 80 beats/minute, the blood pressure 98/85  mm Hg, the respiratory rate 19 breaths/minute, and the oxygen saturation 96% on 15% NRB. He was lethargic, and responded with one-word answers; symmetric movement in the arms and legs was observed. An electrocardiogram showed ventricular paced rhythm with rate 60 and prolonged Qtc.   Pertinent Labs in Red/Diagnostics Findings: Na+/ K+: 139/3.2 Glucose: 121 BUN/Cr.: 56/2.93 AST/ALT: 57/52  WBC: 3.9 PCT: <0.10 Lactic acid: 1.2 COVID PCR: Negative   Troponin:  48 BNP:>4500 Arterial Blood Gas result:  pO2 300; pCO2 67; pH 7.29;  HCO3 32.2, %O2 Sat 99.9.  Patient was placed on BiPAP due to VBG finding concerning for worsening hypoxia and hypercapnea. Patient received additional 500 cc IV fluids bolus and started on broad spectrum antibiotics for suspected infectious process. Due to high risk for decompensation and repeat abnormal ABG findings, PCCM ask to admit to ICU for further management.    Past Medical History   DVT   Ischemic Colitis 03/05/2021  NICM (nonischemic cardiomyopathy) ([place]) 03/05/2021  AKI (acute kidney injury) ([place]) 03/05/2021  Chronic anticoagulation 03/05/2021  History of 2019 novel coronavirus disease (COVID-19) 66/04/3015  Chronic systolic CHF (congestive heart failure), NYHA class 2 ([place]) 02/26/2018  Essential hypertension 05/17/2014  ICD (implantable cardioverter-defibrillator) in place 05/17/2014  Atrial fibrillation ([place]) 03/03/2014  Benign prostatic hyperplasia with lower urinary tract symptoms 07/10/2012     INTERVAL CHANGES Signs of aspiration  Awaiting speech eval Sitting in chair this AM More alert and awake Advanced HF      [facility] Events   10/31/21> Admitted to ICU with acute hypoxic hypercapnic respiratory failure 12/30 remains on biPAP, severe Hypoxic resp failure 12/31 SD status, can transfer to [place]:  Cardiology  Procedures:  None  Significant Diagnostic Tests:  12/28: Chest Xray>Cardiac enlargement, small left pleural effusion and pulmonary vascular congestion. Correlate for any signs or symptoms of CHF. 2. Right base atelectasis. 12/28: Abdominal xray>  Micro Data:  12/28: SARS-CoV-2 PCR> negative 12/28: Influenza PCR> negative 12/28: Blood culture x2> 12/28: Urine Culture> 12/28: MRSA PCR>>  12/28: Strep pneumo urinary antigen> 12/28: Legionella urinary antigen>  Antimicrobials:  Vancomycin 12/28 x 1 Cefepime 12/28 x 1 Metronidazole 12/28 x  1  OBJECTIVE  Blood pressure 107/76, pulse 85, temperature 97.9 F (36.6 C), temperature source Axillary, resp. rate 13, height 5\' 8"  (1.727 m), weight 86.1 kg, SpO2 99 %.    FiO2 (%):  [28 %] 28 %   Intake/Output Summary (Last 24 hours) at 11/03/2021 0751 Last data filed at 11/03/2021 0440 Gross per 24 hour  Intake 708.06 ml  Output 1555 ml  Net -846.94 ml    Filed Weights   11/01/21 0410 11/02/21 0500 11/02/21 1400  Weight: 83.8 kg 84.7 kg 86.1 kg     Review of Systems: +SOB Alert and awake Other:  All other systems negative  Physical Examination:   General Appearance: No distress  EYES PERRLA, EOM intact.   NECK Supple, No JVD Pulmonary: normal breath sounds, No wheezing.  CardiovascularNormal S1,S2.  No m/r/g.   Neuro:without focal findings,  speech normal  PSYCHIATRIC: Mood, affect within normal limits.    Labs/imaging that I havepersonally reviewed  (right click and "Reselect all SmartList Selections" daily)  I, personally viewed and interpreted this ECG. EKG Interpretation Date: 10/31/2021 V.paced. rate 60 BPM PR interval * ms QRS duration 176 ms QT/QTcB 554/554 ms P-R-T axes * -87 90   Labs   CBC: Recent Labs  Lab 10/31/21 2008 11/01/21 0523 11/03/21 0348  WBC 3.9* 4.1 5.7  NEUTROABS 2.1  --   --   HGB 14.6 13.2 13.8  HCT 48.0 43.2 45.1  MCV 95.8 94.5 94.4  PLT 174 146* 142*     Basic Metabolic Panel: Recent Labs  Lab 10/31/21 2008 11/01/21 0523 11/01/21 1818 11/02/21 0342 11/03/21 0348  NA 139 139 141 142 143  K 3.2* 3.0* 3.0* 3.4* 4.0  CL 98 100 101 103 104  CO2 31 31 28 30 28   GLUCOSE 121* 84  --  78 123*  BUN 56* 54*  --  46* 52*  CREATININE 2.93* 2.68*  --  2.29* 2.21*  CALCIUM 9.1 8.7*  --  8.8* 8.8*  MG  --  2.3  --  2.0 2.0  PHOS  --  4.9*  --  4.5 4.1    GFR: Estimated Creatinine Clearance: 29.4 mL/min (A) (by C-G formula based on SCr of 2.21 mg/dL (H)). Recent Labs  Lab 10/31/21 2008 10/31/21 2028  10/31/21 2354 11/01/21 0523 11/03/21 0348  PROCALCITON  --  <0.10  --   --   --   WBC 3.9*  --   --  4.1 5.7  LATICACIDVEN 1.7  --  1.6  --   --      Liver Function Tests: Recent Labs  Lab 10/31/21 2008  AST 57*  ALT 52*  ALKPHOS 92  BILITOT 2.9*  PROT 6.6  ALBUMIN 3.8    No results for input(s): LIPASE, AMYLASE in the last 168 hours. No results for input(s): AMMONIA in the last 168 hours.  ABG    Component Value Date/Time   PHART 7.29 (L) 10/31/2021 2211   PCO2ART 67 (HH) 10/31/2021 2211   PO2ART 300 (H) 10/31/2021 2211   HCO3 32.2 (H) 10/31/2021 2211   O2SAT 99.9 10/31/2021 2211      Coagulation Profile: Recent Labs  Lab 10/31/21 2146 11/01/21 0523 11/02/21 0342 11/03/21 0348  INR 3.9* 3.9* 3.5* 4.1*     Cardiac Enzymes: No results for input(s): CKTOTAL, CKMB, CKMBINDEX, TROPONINI in the last 168 hours.  HbA1C: No results found for: HGBA1C  CBG:  Assessment & Plan:   Acute Hypoxic Hypercapnic Respiratory Failure secondary to Pulmonary  Edema and severe acute heart failure with acute renal failure c/w progressive cardiorenal syndome -Supplemental O2 as needed to maintain O2 saturations 88 to 92% Wean to high flow [place] CPAP as needed   ACUTE SYSTOLIC CARDIAC FAILURE- Acute on Chronic Systolic CHF (Last known LVEF 15-20%) -oxygen as needed -Lasix as tolerated -follow up cardiac enzymes as indicated -follow up cardiology recs  Permanent Atrial Fibrillation  Nonischemic dilated cardiomyopathy -He remains rate controlled and vpaced -Hold Amiodarone for now, Qtc prolonged  and unable to tolerate po -CHADS2VASc  -Patient on Coumadin at home, PT/INR supra-therapeutic, hold anticoagulation. pharmacy consult for management.  ACUTE KIDNEY INJURY/Renal Failure -continue Foley Catheter-assess need -Avoid nephrotoxic agents -Follow urine output, BMP -Ensure adequate renal perfusion, optimize oxygenation -Renal dose medications   Intake/Output Summary  (Last 24 hours) at 11/03/2021 0754 Last data filed at 11/03/2021 0440 Gross per 24 hour  Intake 708.06 ml  Output 1555 ml  Net -846.94 ml    Best practice:  Diet:  NPO Pain/Anxiety/Delirium protocol (if indicated): No VAP protocol (if indicated): Not indicated DVT prophylaxis: Contraindicated GI prophylaxis: PPI Glucose control:  SSI No Central venous access:  N/A Arterial line:  N/A Foley:  Yes, and it is still needed Mobility:  bed rest  PT consulted: N/A Last date of multidisciplinary goals of care discussion [12/28] Code Status:  DNR/DNI Disposition: SD status   SD status Plan to transfer to [place], M.D.  [facility]  Medical Director [place] Director [place] Department

## 2021-11-03 NOTE — Progress Notes (Signed)
Speech Language Pathology Treatment: Dysphagia  Patient Details Name: Adrian Chavez MRN: [id number] DOB: 1941/10/23 Today's Date: 11/03/2021 Time: 2035-5974 SLP Time Calculation (min) (ACUTE ONLY): 25 min  Assessment / Plan / Recommendation Clinical Impression  Patient seen for reassessment of swallow function. Now on stepdown, RN reports using BiPAP at night, on 2L via [place] during the day. Has been tolerating ice chips, per RN. Pt sitting upright in recliner, vocal quality clear, cough strong. Slight wheezing upon inhalation, intermittent throat clearing/coughing at baseline. HR in 80s, RR 15, Sp02 98. Pt consumed ice chips without overt signs of aspiration, vocal quality clear. With sips of thin liquids, intermittent throat clearing, delayed cough x1; difficult to discern if this is indicative of aspiration vs CHF, however with trials of nectar thick liquids and puree, there is minimal delayed throat clearing, no coughing or wet vocal quality. Vital signs remained stable throughout. Recommend initiating conservative diet of dysphagia 1 (puree), nectar via cup, no straws, with meds crushed in puree. RN to monitor with POs and hold POs if pt coughing or vital signs/respiratory status decline. SLP to follow up for advancement at bedside vs MBS if questionable signs of aspiration with thin liquids or any other POs persists. Aspiration precautions should be followed: upright for all POs, slow rate, small bites and sips, plus with allowance for rest breaks intermittently to maintain swallow/breathing reciprocity.      HPI HPI: Per 59 H&P "80 y.o male  with significant PMH of DVT, Ischemic Colitis, NICM (nonischemic cardiomyopathy), AKI (acute kidney injury), Chronic anticoagulation, History of 2019 novel coronavirus disease ([id number]), Chronic systolic CHF (congestive heart failure), NYHA class 2, Essential hypertension, ICD (implantable cardioverter-defibrillator) in place, Atrial fibrillation, Benign  prostatic hyperplasia with lower urinary tract symptoms who presented to the ED with chief complaints of progressive shortness of breath.     Per family members at the bedside, EMS was called for complaints of progressive SOB. The family members reported that the patient has been more weak for a week due to nausea, vomiting, and diarrhea. No reports of fever, chills, chest pain, abdominal pain, or cough. On EMS arrival, patient was already on NRB, he was hypotensive, but alert and oriented. After IV access was obtained, responders began administering fluid and placing the patient on the stair chair. Enroute the patient  received IV fluids. The patient remained alert and oriented, but became lethargic upon arrival to the hospital." CXR on admission "1. Cardiac enlargement, small left pleural effusion and pulmonary  vascular congestion. Correlate for any signs or symptoms of CHF.  2. Right base atelectasis." Head CT on admission "No acute finding.  No hemorrhage or visible infarct."      SLP Plan  Continue with current plan of care      Recommendations for follow up therapy are one component of a multi-disciplinary discharge planning process, led by the attending physician.  Recommendations may be updated based on patient status, additional functional criteria and insurance authorization.    Recommendations  Diet recommendations: Dysphagia 1 (puree);Nectar-thick liquid;Other(comment) (ice chips between meals after oral care) Liquids provided via: Cup;No straw Medication Administration: Crushed with puree Supervision: Patient able to self feed;Intermittent supervision to cue for compensatory strategies (ensure respiratory status remains stable) Compensations: Minimize environmental distractions;Slow rate;Small sips/bites;Other (Comment) (rest breaks intermittently for recovery)                Oral Care Recommendations: Oral care QID Follow Up Recommendations: Other (comment) (tbd) Assistance  recommended at discharge: Other (  comment) (tbd) SLP Visit Diagnosis: Dysphagia, pharyngeal phase (R13.13) Plan: Continue with current plan of care         Deneise Lever, [place], CCC-SLP Speech-Language Pathologist   Aliene Altes  11/03/2021, 8:59 AM

## 2021-11-03 NOTE — Progress Notes (Signed)
NP notified of critical INR of 4.1 and no signs or symptoms of bleeding. NP acknowledged, no new orders at this time.

## 2021-11-03 NOTE — Consult Note (Signed)
CARDIOLOGY CONSULT NOTE               Patient ID: KALYAN BARABAS MRN: [id number] DOB/AGE: 1941/05/20 80 y.o.  Admit date: 10/31/2021 Referring Physician Dr. Nelle Don Primary Physician Dr. Dion Body Primary Cardiologist Dr. 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  Interval history - Improved with IV diuresis.  - Sitting up in chair today and states that he is feeling closer to normal.  - No chest pain or shortness of breath.   Review of systems complete and found to be negative unless listed above     Past Medical History:  Diagnosis Date   Anxiety    CHF (congestive heart failure) ([place])    Claustrophobia    Colitis, ischemic ([place]) 2010   developed DVT   DVT (deep venous thrombosis) ([place]) 2010   stomach and leg   Dysrhythmia    A-Fib   Hypertension     Past Surgical History:  Procedure Laterality Date   HERNIA REPAIR Right 2007   IMPLANTABLE CARDIOVERTER DEFIBRILLATOR (ICD) GENERATOR CHANGE Left 02/21/2016   Procedure: ICD GENERATOR CHANGE;  Surgeon: Isaias Cowman, MD;  Location: [facility] ORS;  Service: Cardiovascular;  Laterality: Left;   PACEMAKER INSERTION     PITUITARY SURGERY  2002   Done at [facility]    Medications Prior to Admission  Medication Sig Dispense Refill Last Dose   amiodarone (PACERONE) 200 MG tablet Take 200 mg by mouth 2 (two) times daily.   Past Week   carvedilol (COREG) 3.125 MG tablet Take 3.125 mg by mouth 2 (two) times daily.   Past Week   cetirizine (ZYRTEC) 10 MG tablet Take 1 tablet by mouth  daily.   Past Week   ENTRESTO 49-51 MG Take 1 tablet by mouth 2 (two) times daily.   Past Week   metolazone (ZAROXOLYN) 5 MG tablet Take 5 mg by mouth daily.   Past Week   potassium chloride SA (K-DUR,KLOR-CON) 20 MEQ tablet Take 20 mEq by mouth 2 (two) times daily.   Past Week   sertraline (ZOLOFT) 25 MG tablet Take 25 mg by mouth daily.   Past Week   vitamin B-12 (CYANOCOBALAMIN) 1000 MCG tablet Take 1,000 mcg by mouth daily.   Past Week   albuterol (VENTOLIN HFA) 108 (90 Base) MCG/ACT inhaler SMARTSIG:1-2 Inhalation Via Inhaler Every 4 Hours PRN   prn at unknown   ALPRAZolam (XANAX) 0.5 MG tablet Take 0.5 mg by mouth daily as needed for anxiety.    prn at unknown   CIALIS 5 MG tablet Take 5 mg by mouth daily.    prn at unknown   finasteride (PROSCAR) 5 MG tablet Take 1 tablet (5 mg total) by mouth daily. (Patient not taking: Reported on 11/02/2021) 90 tablet 3 Not Taking   fluticasone (FLONASE) 50 MCG/ACT nasal spray Place 2 sprays into both nostrils daily as needed for allergies or rhinitis.   prn at unknown   torsemide (DEMADEX) 20 MG tablet Take 20 mg by mouth 2 (two) times daily. (Patient not taking: Reported on 11/02/2021)   Not Taking   warfarin (COUMADIN) 1 MG tablet Take  2 mg by mouth See admin instructions. Take 2 MG by mouth every day   10/31/2021 at pm   Social History   Socioeconomic History   Marital status: Married    Spouse name: Not on file   Number of children: Not on file   Years of education: Not on file   Highest education level: Not on file  Occupational History   Not on file  Tobacco Use   Smoking status: Former    Types: Cigarettes    Quit date: 02/13/1974    Years since quitting: 47.7   Smokeless tobacco: Never  Substance and Sexual Activity   Alcohol use: No   Drug use: No   Sexual activity: Not on file  Other Topics Concern   Not on file  Social History Narrative   Not on file   Social Determinants of Health   Financial Resource Strain: Not on file   Food Insecurity: Not on file  Transportation Needs: Not on file  Physical Activity: Not on file  Stress: Not on file  Social Connections: Not on file  Intimate Partner Violence: Not on file    History reviewed. No pertinent family history.    Review of systems complete and found to be negative unless listed above    PHYSICAL EXAM General: Elderly and ill-appearing Caucasian male, in no acute distress.  Resting at angle against ICU bed wall with BiPAP in place  HEENT:  Normocephalic and atraumatic. Neck:  No JVD.  Lungs: Conversational dyspnea and tachypneic on BiPAP, O2 sat 100%.  Coarse breath sounds bilaterally to auscultation. Heart: Ventricularly paced at 80 bpm. Normal S1 and S2 without gallops or murmurs. Radial & DP pulses 2+ bilaterally.  Left chest with BiV AICD in place. Abdomen: distended appearing.  Msk: Normal strength and tone for age. Extremities: Edematous upper and lower extremities bilaterally. no clubbing, cyanosis. Neuro: Alert and oriented X 3. Psych:  Mood frustrated, affect congruent.   Labs:   Lab Results  Component Value Date   WBC 5.7 11/03/2021   HGB 13.8 11/03/2021   HCT 45.1 11/03/2021   MCV 94.4 11/03/2021   PLT 142 (L) 11/03/2021    Recent Labs  Lab 10/31/21 2008 11/01/21 0523 11/03/21 0348  NA 139   < > 143  K 3.2*   < > 4.0  CL 98   < > 104  CO2 31   < > 28  BUN 56*   < > 52*  CREATININE 2.93*   < > 2.21*  CALCIUM 9.1   < > 8.8*  PROT 6.6  --   --   BILITOT 2.9*  --   --   ALKPHOS 92  --   --   ALT 52*  --   --   AST 57*  --   --   GLUCOSE 121*   < > 123*   < > = values in this interval not displayed.    Lab Results  Component Value Date   CKTOTAL 142 04/07/2012   CKMB 2.5 04/07/2012   TROPONINI 0.04 (H) 09/08/2015   No results found for: CHOL No results found for: HDL No results found for: LDLCALC No results found for: TRIG No results found for: CHOLHDL No results found for: LDLDIRECT    Radiology: DG Chest 1  View  Result Date: 11/02/2021 CLINICAL DATA:  81 year old male with history of DVT. EXAM: CHEST  1 VIEW COMPARISON:  Chest x-ray 10/31/2021. FINDINGS: Lung volumes are normal. There is  cephalization of the pulmonary vasculature and slight indistinctness of the interstitial markings suggestive of mild pulmonary edema. Trace left pleural effusion. No definite right pleural effusion. No pneumothorax. Moderate to severe cardiomegaly. The patient is rotated to the right on today's exam, resulting in distortion of the mediastinal contours and reduced diagnostic sensitivity and specificity for mediastinal pathology. Atherosclerotic calcifications in the thoracic aorta. Left-sided biventricular pacemaker/AICD with lead tips projecting over the expected location of the right atrium, right ventricle and lateral wall the left ventricle via the coronary sinus and coronary veins. IMPRESSION: 1. The appearance the chest suggest congestive heart failure, as above. Electronically Signed   By: Vinnie Langton M.D.   On: 11/02/2021 07:22   DG Abd 1 View  Result Date: 10/31/2021 CLINICAL DATA:  Abdominal pain. EXAM: ABDOMEN - 1 VIEW COMPARISON:  October 27, 2010 FINDINGS: Mild atelectatic changes are seen within the bilateral lung bases. The cardiac silhouette is markedly enlarged. The bowel gas pattern is normal. Multiple small radiopaque surgical coils are seen overlying the lower pelvis on the right. No radio-opaque calculi or other significant radiographic abnormality are seen. IMPRESSION: 1. Normal bowel gas pattern without evidence of renal calculi. 2. Mild bibasilar atelectasis. Electronically Signed   By: Virgina Norfolk M.D.   On: 10/31/2021 23:39   US Venous Img Upper Uni Left (DVT)  Result Date: 11/01/2021 CLINICAL DATA:  Bruising of left forearm status post fall 1 week ago Edema Pain EXAM: LEFT UPPER EXTREMITY VENOUS DOPPLER ULTRASOUND TECHNIQUE: Gray-scale sonography with graded compression, as well as  color Doppler and duplex ultrasound were performed to evaluate the upper extremity deep venous system from the level of the subclavian vein and including the jugular, axillary, basilic, radial, ulnar and upper cephalic vein. Spectral Doppler was utilized to evaluate flow at rest and with distal augmentation maneuvers. COMPARISON:  None. FINDINGS: Contralateral Subclavian Vein: Respiratory phasicity is normal and symmetric with the symptomatic side. No evidence of thrombus. Normal compressibility. Internal Jugular Vein: No evidence of thrombus. Normal compressibility, respiratory phasicity and response to augmentation. Subclavian Vein: No evidence of thrombus. Normal compressibility, respiratory phasicity and response to augmentation. Axillary Vein: No evidence of thrombus. Normal compressibility, respiratory phasicity and response to augmentation. Cephalic Vein: No evidence of thrombus. Normal compressibility, respiratory phasicity and response to augmentation. 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 Brachial Veins: No evidence of thrombus. Normal compressibility, respiratory phasicity and response to augmentation. Radial Veins: No evidence of thrombus. Normal compressibility, respiratory phasicity and response to augmentation. Ulnar Veins: No evidence of thrombus. Normal compressibility, respiratory phasicity and response to augmentation. Venous Reflux:  None visualized. Other Findings:  None visualized. IMPRESSION: 1. No left lower extremity DVT. 2. Minimal partially occlusive web-like thrombus in the proximal left basilic vein is consistent with chronic superficial venous thrombosis. Electronically Signed   By: Miachel Roux M.D.   On: 11/01/2021 17:29   DG Chest Port 1 View  Result Date: 10/31/2021 CLINICAL DATA:  Questionable sepsis.  Evaluate for abnormality. EXAM: PORTABLE CHEST 1 VIEW COMPARISON:  03/05/2021 FINDINGS: Multi lead ICD is identified  with battery pack in the left chest wall. Marked cardiac enlargement is unchanged. There is blunting of the left costophrenic angle, new from previous exam. This may represent a small effusion. Diffuse pulmonary vascular congestion. No frank edema or airspace consolidation. Platelike atelectasis is noted in the right lung base. IMPRESSION: 1. Cardiac enlargement, small left pleural effusion and pulmonary vascular congestion. Correlate for  any signs or symptoms of CHF. 2. Right base atelectasis. Electronically Signed   By: Kerby Moors M.D.   On: 10/31/2021 20:26   DG Shoulder Left Port  Result Date: 11/01/2021 CLINICAL DATA:  Left shoulder pain EXAM: LEFT SHOULDER COMPARISON:  None. FINDINGS: Mild AC joint degenerative change. Moderate glenohumeral degenerative change. No fracture or dislocation. Faint calcifications at the superolateral humeral head. IMPRESSION: 1. No acute osseous abnormality 2. Moderate degenerative changes with calcific tendinopathy. Electronically Signed   By: Donavan Foil M.D.   On: 11/01/2021 16:10   ECHOCARDIOGRAM COMPLETE  Result Date: 11/01/2021    ECHOCARDIOGRAM REPORT   Patient Name:   SHAKA CARDIN Date of Exam: 11/01/2021 Medical Rec #:  [id number]    Height:       68.0 in Accession #:    [id number]   Weight:       184.7 lb Date of Birth:  04-May-1941    BSA:          1.976 m Patient Age:    67 years     BP:           101/73 mmHg Patient Gender: M            HR:           78 bpm. Exam Location:  [facility] Procedure: 2D Echo, Cardiac Doppler and Color Doppler Indications:     CHF-acute diastolic J82.50                  CHF-acute systolic N39.76  History:         Patient has no prior history of Echocardiogram examinations.                  CHF; Risk Factors:Hypertension.  Sonographer:     Sherrie Sport Referring Phys:  [id number] [facility] OUMA Diagnosing Phys: Donnelly Angelica  Sonographer Comments: Suboptimal apical window. IMPRESSIONS  1. Left ventricular ejection fraction, by  estimation, is <20%. The left ventricle has severely decreased function. The left ventricle demonstrates regional wall motion abnormalities (see scoring diagram/findings for description). The left ventricular internal cavity size was mildly dilated. Left ventricular diastolic parameters are indeterminate. There is severe hypokinesis of the left ventricular, entire inferior wall and inferolateral wall.  2. Right ventricular systolic function reduced. The right ventricular size is not well visualized.  3. Left atrial size was severely dilated.  4. Right atrial size was severely dilated.  5. The mitral valve is grossly normal. Moderate mitral valve regurgitation.  6. The aortic valve is normal in structure. Aortic valve regurgitation is not visualized. Aortic valve sclerosis is present, with no evidence of aortic valve stenosis.  7. Mild pulmonic stenosis. FINDINGS  Left Ventricle: Left ventricular ejection fraction, by estimation, is <20%. The left ventricle has severely decreased function. The left ventricle demonstrates regional wall motion abnormalities. Severe hypokinesis of the left ventricular, entire inferior wall and inferolateral wall. The left ventricular internal cavity size was mildly dilated. There is no left ventricular hypertrophy. Left ventricular diastolic parameters are indeterminate. Right Ventricle: The right ventricular size is not well visualized. Right vetricular wall thickness was not well visualized. Right ventricular systolic function reduced. Left Atrium: Left atrial size was severely dilated. Right Atrium: Right atrial size was severely dilated. Pericardium: There is no evidence of pericardial effusion. Mitral Valve: The mitral valve is grossly normal. Moderate mitral valve regurgitation. Tricuspid Valve: The tricuspid valve is normal in structure. Tricuspid valve regurgitation is not demonstrated. Aortic Valve:  The aortic valve is normal in structure. Aortic valve regurgitation is not  visualized. Aortic valve sclerosis is present, with no evidence of aortic valve stenosis. Aortic valve mean gradient measures 2.0 mmHg. Aortic valve peak gradient measures 3.4 mmHg. Aortic valve area, by VTI measures 2.47 cm. Pulmonic Valve: The pulmonic valve was not well visualized. Pulmonic valve regurgitation is not visualized. Mild pulmonic stenosis. Aorta: The aortic root is normal in size and structure. Venous: The inferior vena cava was not well visualized. IAS/Shunts: The interatrial septum was not well visualized.  LEFT VENTRICLE PLAX 2D LVIDd:         6.20 cm LVIDs:         5.70 cm LV PW:         1.40 cm LV IVS:        0.95 cm LVOT diam:     2.10 cm LV SV:         32 LV SV Index:   16 LVOT Area:     3.46 cm  LV Volumes (MOD) LV vol d, MOD A4C: 328.0 ml LV vol s, MOD A4C: 219.0 ml LV SV MOD A4C:     328.0 ml RIGHT VENTRICLE RV Basal diam:  4.70 cm RV S prime:     9.68 cm/s TAPSE (M-mode): 4.2 cm LEFT ATRIUM              Index         RIGHT ATRIUM           Index LA diam:        8.30 cm  4.20 cm/m    RA Area:     50.50 cm LA Vol (A2C):   554.0 ml 280.37 ml/m  RA Volume:   230.00 ml 116.40 ml/m LA Vol (A4C):   339.0 ml 171.56 ml/m LA Biplane Vol: 439.0 ml 222.17 ml/m  AORTIC VALVE                    PULMONIC VALVE AV Area (Vmax):    2.39 cm     PV Vmax:        0.60 m/s AV Area (Vmean):   2.21 cm     PV Vmean:       37.450 cm/s AV Area (VTI):     2.47 cm     PV VTI:         0.081 m AV Vmax:           91.80 cm/s   PV Peak grad:   1.4 mmHg AV Vmean:          62.100 cm/s  PV Mean grad:   1.0 mmHg AV VTI:            0.130 m      RVOT Peak grad: 3 mmHg AV Peak Grad:      3.4 mmHg AV Mean Grad:      2.0 mmHg LVOT Vmax:         63.30 cm/s LVOT Vmean:        39.600 cm/s LVOT VTI:          0.093 m LVOT/AV VTI ratio: 0.71  AORTA Ao Root diam: 3.10 cm MITRAL VALVE                TRICUSPID VALVE MV Area (PHT): 4.93 cm     TR Peak grad:   16.8 mmHg MV Decel Time: 154 msec     TR Vmax:  205.00 cm/s MV E  velocity: 111.00 cm/s                             SHUNTS                             Systemic VTI:  0.09 m                             Systemic Diam: 2.10 cm                             Pulmonic VTI:  0.130 m Donnelly Angelica Electronically signed by Donnelly Angelica Signature Date/Time: 11/01/2021/12:52:46 PM    Final    [place] LT UPPER EXTREM LTD SOFT TISSUE NON VASCULAR  Result Date: 11/01/2021 CLINICAL DATA:  Left forearm bruise status post fall 1 week ago EXAM: ULTRASOUND LEFT UPPER EXTREMITY LIMITED TECHNIQUE: Ultrasound examination of the upper extremity soft tissues was performed in the area of clinical concern. COMPARISON:  None. FINDINGS: Targeted sonographic evaluation of the left forearm demonstrates a 1.5 x 0.6 x 1.0 cm complex fluid collection which is likely a hematoma. IMPRESSION: 1.5 x 0.6 x 1.0 cm complex fluid collection in the left forearm is likely a small hematoma. If patient's symptoms worsen, repeat ultrasound evaluation should be performed. Electronically Signed   By: Miachel Roux M.D.   On: 11/01/2021 17:30   CT HEAD CODE STROKE WO CONTRAST`  Result Date: 11/01/2021 CLINICAL DATA:  Code stroke.  Facial droop and slurred speech EXAM: CT HEAD WITHOUT CONTRAST TECHNIQUE: Contiguous axial images were obtained from the base of the skull through the vertex without intravenous contrast. COMPARISON:  01/07/2015 FINDINGS: Brain: No evidence of acute infarction, hemorrhage, hydrocephalus, extra-axial collection or mass lesion/mass effect. Vascular: No hyperdense vessel or unexpected calcification. Skull: Normal. Negative for fracture or focal lesion. Sinuses/Orbits: No acute finding. Other: These results were communicated to Dr Stark Klein at 6:03 am on 11/01/2021 by text page via the [facility] messaging system. ASPECTS [facility] Stroke Program Early CT Score) - Ganglionic level infarction (caudate, lentiform nuclei, internal capsule, insula, M1-M3 cortex): 7 - Supraganglionic infarction (M4-M6 cortex): 3 Total score  (0-10 with 10 being normal): 10 IMPRESSION: No acute finding.  No hemorrhage or visible infarct. Electronically Signed   By: Jorje Guild M.D.   On: 11/01/2021 06:05    ECHO 11/01/21 LVEF less than 20% with severely decreased function, severe hypokinesis of left ventricle, entire inferior wall and inferolateral wall.  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 -Continue IV Lasix 60 mg twice daily to assist with diuresis as patient appears significantly volume overloaded on exam - Holding home Entresto, Coreg, metolazone and torsemide. Will resume as tolerated.   #Acute hypoxic respiratory failure 2/2 suspected volume  overload and possible pneumonia Antibiotics and respiratory support per primary team  #Permanent atrial fibrillation on warfarin for anticoagulation; s/p AV node ablation - Resume amiodarone 200 mg daily (prescribed BID, but unclear reason) - Warfarin supratherapeutic 3.5 today, appreciate pharmacy assistance with dosing  #AKI on CKD Creatinine downtrending 2.29 Continue to monitor, replete electrolytes as needed  #Hypertension #History of DVT As above   Signed: Andrez Grime , MD 11/03/2021, 9:37 AM

## 2021-11-03 NOTE — Evaluation (Signed)
Occupational Therapy Evaluation Patient Details Name: Adrian Chavez MRN: [id number] DOB: Sep 27, 1941 Today's Date: 11/03/2021   History of Present Illness Pt is a 80 y/o M who was admitted on 10/31/21 after presenting with c/c of SOB. Pt is being treated for acute hypoxic hypercapnic respiratory failure 2/2 pulmonary edema & severe acute heart failure with acute renal failture consistent with progressive cardiorenal syndrome. PMH: DVT, ischemic colitis, NICM, AKI, chronic anticoagulation, chronic systolic CHF, NYHA class 2, essential HTN, ICD, a-fib, benign prostatic hyperplasia   Clinical Impression   Pt was seen for OT evaluation this date. 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 Pt lives in a 1 story home with 5 STE with his spouse. Currently pt demonstrates mild impairments as described below (See OT problem list) which functionally limit his ability to perform ADL/self-care tasks. Pt currently requires CGA for ADL transfers, supv-CGA for LB ADL tasks involving ADL transfers 2/2 decr strength, balance, and activity tolerance. Pt educated in energy conservation strategies including AE/DME, home/routines modifications, and activity pacing to support ADL/IADL safety with gradual return to PLOF. Pt verbalized understanding and would benefit from skilled OT services to address noted impairments and functional limitations (see below for any additional details) in order to maximize safety and independence while minimizing falls risk and caregiver burden. Upon hospital discharge, recommend HHOT to maximize pt safety and return to functional independence during meaningful occupations of daily life.    Recommendations for follow up therapy are one component of a multi-disciplinary discharge planning process, led by the attending physician.  Recommendations may be updated based on patient status, additional functional criteria and insurance authorization.   Follow Up  Recommendations  Home health OT    Assistance Recommended at Discharge Intermittent Supervision/Assistance  Functional Status Assessment  Patient has had a recent decline in their functional status and demonstrates the ability to make significant improvements in function in a reasonable and predictable amount of time.  Equipment Recommendations  None recommended by OT    Recommendations for Other Services       Precautions / Restrictions Precautions Precautions: Fall Restrictions Weight Bearing Restrictions: No      Mobility Bed Mobility Overal bed mobility: Needs Assistance Bed Mobility: Supine to Sit;Sit to Supine     Supine to sit: Supervision Sit to supine: Supervision   General bed mobility comments: not observed, pt received & left sitting in recliner    Transfers Overall transfer level: Needs assistance Equipment used: None Transfers: Sit to/from Stand Sit to Stand: Min guard                  Balance Overall balance assessment: Mild deficits observed, not formally tested Sitting-balance support: Feet supported Sitting balance-Leahy Scale: Fair     Standing balance support: No upper extremity supported Standing balance-Leahy Scale: Poor                             ADL either performed or assessed with clinical judgement   ADL                                         General ADL Comments: Pt requires supervision - CGA for LB ADL involving ADL transfers     Vision         Perception     Praxis  Pertinent Vitals/Pain Pain Assessment: No/denies pain     Hand Dominance     Extremity/Trunk Assessment Upper Extremity Assessment Upper Extremity Assessment: Generalized weakness (LUE edema noted, RN already aware)   Lower Extremity Assessment Lower Extremity Assessment: Generalized weakness   Cervical / Trunk Assessment Cervical / Trunk Assessment: Kyphotic   Communication Communication Communication:  HOH (hearing aides not present)   Cognition Arousal/Alertness: Awake/alert Behavior During Therapy: WFL for tasks assessed/performed Overall Cognitive Status: Within Functional Limits for tasks assessed                                      General Comments  Pt on 2L/min via nasal cannula, SPO2 >90%. Pt with elevated INR but MD cleared pt for participation in session.    Exercises Other Exercises: Pt educated in energy conservation strategies including AE/DME, home/routines modifications, and activity pacing to support ADL/IADL safety with gradual return to PLOF   Shoulder Instructions      Home Living Family/patient expects to be discharged to:: Private residence Living Arrangements: Spouse/significant other Available Help at Discharge: Family;Available 24 hours/day Type of Home: House Home Access: Stairs to enter [employer] of Steps: 5 Entrance Stairs-Rails: Right Home Layout: One level               Home Equipment: None;Shower seat          Prior Functioning/Environment Prior Level of Function : Independent/Modified Independent             Mobility Comments: 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 ADLs Comments: Indep, no difficulty. Reports 1 recent fall where it was dark and he lost his balance.        OT Problem List: Impaired balance (sitting and/or standing);Decreased strength;Decreased activity tolerance      OT Treatment/Interventions: Self-care/ADL training;Therapeutic exercise;Therapeutic activities;Energy conservation;Patient/family education;Balance training;DME and/or AE instruction    OT Goals(Current goals can be found in the care plan section) Acute Rehab OT Goals Patient Stated Goal: get stronger, go home OT Goal Formulation: With patient Time For Goal Achievement: 11/17/21 Potential to Achieve Goals: Good ADL Goals Additional ADL Goal #1: Pt will be modified independent with bathing,  dressing, and toileting, 3/3 opportunities. Additional ADL Goal #2: Pt will verbalize plan to implement at least 2 learned ECS to maximize safety/indep with ADL/IADL.  OT Frequency: Min 2X/week   Barriers to D/C:            Co-evaluation              AM-PAC OT "6 Clicks" Daily Activity     Outcome Measure Help from another person eating meals?: None Help from another person taking care of personal grooming?: None Help from another person toileting, which includes using toliet, bedpan, or urinal?: A Little Help from another person bathing (including washing, rinsing, drying)?: A Little Help from another person to put on and taking off regular upper body clothing?: None Help from another person to put on and taking off regular lower body clothing?: A Little 6 Click Score: 21   End of Session    Activity Tolerance: Patient tolerated treatment well Patient left: in bed;with call bell/phone within reach  OT Visit Diagnosis: Other abnormalities of gait and mobility (R26.89);Muscle weakness (generalized) (M62.81)                Time: 9937-1696 OT Time Calculation (min): 15  min Charges:  OT General Charges $OT Visit: 1 Visit OT Evaluation $OT Eval Low Complexity: 1 Low OT Treatments $Self Care/Home Management : 8-22 mins  Ardeth Perfect., MPH, MS, OTR/L ascom [phone] 11/03/21, 4:04 PM

## 2021-11-03 NOTE — Evaluation (Signed)
Physical Therapy Evaluation Patient Details Name: Adrian Chavez MRN: [id number] DOB: [birth year] Today's Date: 11/03/2021  History of Present Illness  Pt is a 80 y/o M who was admitted on 10/31/21 after presenting with c/c of SOB. Pt is being treated for acute hypoxic hypercapnic respiratory failure 2/2 pulmonary edema & severe acute heart failure with acute renal failture consistent with progressive cardiorenal syndrome. PMH: DVT, ischemic colitis, NICM, AKI, chronic anticoagulation, chronic systolic CHF, NYHA class 2, essential HTN, ICD, a-fib, benign prostatic hyperplasia  Clinical Impression  Pt seen for PT evaluation with son & wife present for session. Pt on 2L/min & with low BP but no symptoms & nurse cleared pt for participation. Pt is able to transfer to standing with CGA but requires min assist to ambulate without UE support as pt very unsteady on his feet. Educated pt on & provided him with RW & pt able to ambulate with CGA with improved balance. Educated pt on importance of graded increase in activity (pt very eager to return to [facility] as he is currently working as a Physiological scientist). Anticipate pt can d/c home with HHPT f/u. Will continue to follow pt acutely to progress gait with LRAD, address balance, endurance & stair negotiation.  BP checked in RUE: Sitting: 86/55 mmHg MAP 66 Sitting: 89/64 mmHg MAP 73 After gait, while sitting in recliner: 103/80 mmHg MAP 80       Recommendations for follow up therapy are one component of a multi-disciplinary discharge planning process, led by the attending physician.  Recommendations may be updated based on patient status, additional functional criteria and insurance authorization.  Follow Up Recommendations Home health PT    Assistance Recommended at Discharge Intermittent Supervision/Assistance  Functional Status Assessment Patient has had a recent decline in their functional status and demonstrates the ability to make significant improvements  in function in a reasonable and predictable amount of time.  Equipment Recommendations  Rolling walker (2 wheels)    Recommendations for Other Services       Precautions / Restrictions Precautions Precautions: Fall Restrictions Weight Bearing Restrictions: No      Mobility  Bed Mobility               General bed mobility comments: not observed, pt received & left sitting in recliner    Transfers Overall transfer level: Needs assistance Equipment used: None Transfers: Sit to/from Stand Sit to Stand: Min guard                Ambulation/Gait Ambulation/Gait assistance: Min assist;Min guard Gait Distance (Feet):  (12 ft no AD min assist + 24 ft RW & CGA) Assistive device: None;Rolling walker (2 wheels) Gait Pattern/deviations: Decreased step length - right;Decreased step length - left;Decreased stride length Gait velocity: decreased        Stairs            Wheelchair Mobility    Modified Rankin (Stroke Patients Only)       Balance Overall balance assessment: Needs assistance Sitting-balance support: Feet supported Sitting balance-Leahy Scale: Fair     Standing balance support: No upper extremity supported Standing balance-Leahy Scale: Poor                               Pertinent Vitals/Pain Pain Assessment: No/denies pain    Home Living Family/patient expects to be discharged to:: Private residence Living Arrangements: Spouse/significant other Available Help at Discharge: Family;Available 24 hours/day Type of  Home: House Home Access: Stairs to enter Entrance Stairs-Rails: Right Entrance Stairs-Number of Steps: 5   Home Layout: One level Home Equipment: None      Prior Function Prior Level of Function : Independent/Modified Independent             Mobility Comments: 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       Hand Dominance        Extremity/Trunk Assessment   Upper Extremity  Assessment Upper Extremity Assessment: Generalized weakness (LUE edema - nurse aware)    Lower Extremity Assessment Lower Extremity Assessment: Generalized weakness    Cervical / Trunk Assessment Cervical / Trunk Assessment: Kyphotic  Communication   Communication: HOH (hearing aides not present)  Cognition Arousal/Alertness: Awake/alert Behavior During Therapy: WFL for tasks assessed/performed Overall Cognitive Status: Within Functional Limits for tasks assessed                                 General Comments: Pt agreeable & eager to mobilize, PT educated pt on importance of graded activity & need for supervision with mobility at this time.        General Comments General comments (skin integrity, edema, etc.): Pt on 2L/min via nasal cannula, SPO2 >90%. Pt with elevated INR but MD cleared pt for participation in session.    Exercises Other Exercises Other Exercises: 5x sit<>stand without UE support with CGA x 2 sets with focus on BLE strengthening   Assessment/Plan    PT Assessment Patient needs continued PT services  PT Problem List Decreased strength;Decreased mobility;Decreased balance;Decreased knowledge of use of DME;Decreased activity tolerance;Cardiopulmonary status limiting activity       PT Treatment Interventions DME instruction;Therapeutic exercise;Gait training;Balance training;Functional mobility training;Neuromuscular re-education;Stair training;Patient/family education;Therapeutic activities;Modalities    PT Goals (Current goals can be found in the Care Plan section)  Acute Rehab PT Goals Patient Stated Goal: get better, go home PT Goal Formulation: With patient Time For Goal Achievement: 11/17/21 Potential to Achieve Goals: Good    Frequency Min 2X/week   Barriers to discharge        Co-evaluation               AM-PAC PT "6 Clicks" Mobility  Outcome Measure Help needed turning from your back to your side while in a flat bed  without using bedrails?: None Help needed moving from lying on your back to sitting on the side of a flat bed without using bedrails?: A Little Help needed moving to and from a bed to a chair (including a wheelchair)?: A Little Help needed standing up from a chair using your arms (e.g., wheelchair or bedside chair)?: A Little Help needed to walk in hospital room?: A Little Help needed climbing 3-5 steps with a railing? : A Lot 6 Click Score: 18    End of Session Equipment Utilized During Treatment: Gait belt;Oxygen Activity Tolerance: Patient tolerated treatment well Patient left: in chair;with family/visitor present;with call bell/phone within reach Nurse Communication: Mobility status PT Visit Diagnosis: Unsteadiness on feet (R26.81);Muscle weakness (generalized) (M62.81)    Time: 0272-5366 PT Time Calculation (min) (ACUTE ONLY): 24 min   Charges:   PT Evaluation $PT Eval Moderate Complexity: 1 Mod PT Treatments $Therapeutic Activity: 8-22 mins        Lavone Nian, PT, DPT 11/03/21, 12:43 PM   Waunita Schooner 11/03/2021, 12:41 PM

## 2021-11-04 LAB — CBC
HCT: 42.9 % (ref 39.0–52.0)
Hemoglobin: 13.1 g/dL (ref 13.0–17.0)
MCH: 28.8 pg (ref 26.0–34.0)
MCHC: 30.5 g/dL (ref 30.0–36.0)
MCV: 94.3 fL (ref 80.0–100.0)
Platelets: 170 10*3/uL (ref 150–400)
RBC: 4.55 MIL/uL (ref 4.22–5.81)
RDW: 17.5 % — ABNORMAL HIGH (ref 11.5–15.5)
WBC: 9.2 10*3/uL (ref 4.0–10.5)
nRBC: 0 % (ref 0.0–0.2)

## 2021-11-04 LAB — URINE CULTURE: Culture: 70000 — AB

## 2021-11-04 LAB — BASIC METABOLIC PANEL
Anion gap: 6 (ref 5–15)
BUN: 66 mg/dL — ABNORMAL HIGH (ref 8–23)
CO2: 31 mmol/L (ref 22–32)
Calcium: 8.9 mg/dL (ref 8.9–10.3)
Chloride: 102 mmol/L (ref 98–111)
Creatinine, Ser: 2.17 mg/dL — ABNORMAL HIGH (ref 0.61–1.24)
GFR, Estimated: 30 mL/min — ABNORMAL LOW (ref >60)
Glucose, Bld: 125 mg/dL — ABNORMAL HIGH (ref 70–99)
Potassium: 4 mmol/L (ref 3.5–5.1)
Sodium: 139 mmol/L (ref 135–145)

## 2021-11-04 LAB — PROTIME-INR
INR: 4.3 (ref 0.8–1.2)
Prothrombin Time: 41 seconds — ABNORMAL HIGH (ref 11.4–15.2)

## 2021-11-04 LAB — PHOSPHORUS: Phosphorus: 2.6 mg/dL (ref 2.5–4.6)

## 2021-11-04 LAB — MAGNESIUM: Magnesium: 2.1 mg/dL (ref 1.7–2.4)

## 2021-11-04 MED ORDER — FUROSEMIDE 10 MG/ML IJ SOLN
80.0000 mg | Freq: Two times a day (BID) | INTRAMUSCULAR | Status: DC
  Administered 2021-11-04 – 2021-11-07 (×6): 80 mg via INTRAVENOUS
  Filled 2021-11-04 (×6): qty 8

## 2021-11-04 MED ORDER — CARVEDILOL 3.125 MG PO TABS
3.1250 mg | ORAL_TABLET | Freq: Two times a day (BID) | ORAL | Status: DC
  Administered 2021-11-04 – 2021-11-08 (×5): 3.125 mg via ORAL
  Filled 2021-11-04 (×5): qty 1

## 2021-11-04 NOTE — Consult Note (Signed)
CARDIOLOGY CONSULT NOTE               Patient ID: Adrian Chavez MRN: [id number] DOB/AGE: 79-17-1942 79 y.o.  Admit date: 10/31/2021 Referring Physician Dr. Nelle Don Primary Physician Dr. Dion Body Primary Cardiologist Dr. 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  Interval history - Net negative ~ 850 yesterday.  - Says he feels great this morning and denies shortness of breath and chest pain.  - Probably more intake yesterday than recorded.   Review of systems complete and found to be negative unless listed above     Past Medical History:  Diagnosis Date   Acute urinary retention 03/05/2021   Anxiety    CHF (congestive heart failure) (HCC)    Claustrophobia    Colitis, ischemic ([place]) 2010   developed DVT   DVT (deep venous thrombosis) ([place]) 2010   stomach and leg   Dysrhythmia    A-Fib   History of 2019 novel coronavirus disease (COVID-19) 11/22/2019   Hypertension     Past Surgical History:  Procedure Laterality Date   HERNIA REPAIR Right 2007   IMPLANTABLE CARDIOVERTER DEFIBRILLATOR (ICD) GENERATOR CHANGE Left 02/21/2016   Procedure: ICD GENERATOR CHANGE;  Surgeon: Isaias Cowman, MD;  Location: [facility] ORS;  Service: Cardiovascular;  Laterality: Left;   PACEMAKER INSERTION     PITUITARY SURGERY  2002   Done at [facility]    Medications Prior to Admission  Medication Sig Dispense Refill Last Dose   amiodarone (PACERONE) 200 MG tablet Take 200 mg by mouth 2 (two) times daily.   Past Week   carvedilol (COREG) 3.125 MG tablet  Take 3.125 mg by mouth 2 (two) times daily.   Past Week   cetirizine (ZYRTEC) 10 MG tablet Take 1 tablet by mouth daily.   Past Week   ENTRESTO 49-51 MG Take 1 tablet by mouth 2 (two) times daily.   Past Week   metolazone (ZAROXOLYN) 5 MG tablet Take 5 mg by mouth daily.   Past Week   potassium chloride SA (K-DUR,KLOR-CON) 20 MEQ tablet Take 20 mEq by mouth 2 (two) times daily.   Past Week   sertraline (ZOLOFT) 25 MG tablet Take 25 mg by mouth daily.   Past Week   vitamin B-12 (CYANOCOBALAMIN) 1000 MCG tablet Take 1,000 mcg by mouth daily.   Past Week   albuterol (VENTOLIN HFA) 108 (90 Base) MCG/ACT inhaler SMARTSIG:1-2 Inhalation Via Inhaler Every 4 Hours PRN   prn at unknown   ALPRAZolam (XANAX) 0.5 MG tablet Take 0.5 mg by mouth daily as needed for anxiety.    prn at unknown   CIALIS 5 MG tablet Take 5 mg by mouth daily.    prn at unknown   finasteride (PROSCAR) 5 MG tablet Take 1 tablet (5 mg total) by mouth daily. (Patient not taking: Reported on 11/02/2021) 90 tablet 3 Not Taking   fluticasone (FLONASE) 50 MCG/ACT nasal spray Place 2 sprays into both nostrils daily as needed for allergies or rhinitis.   prn at unknown   torsemide (DEMADEX) 20 MG tablet Take 20 mg by mouth 2 (two) times daily. (Patient not  taking: Reported on 11/02/2021)   Not Taking   warfarin (COUMADIN) 1 MG tablet Take 2 mg by mouth See admin instructions. Take 2 MG by mouth every day   10/31/2021 at pm   Social History   Socioeconomic History   Marital status: Married    Spouse name: Not on file   Number of children: Not on file   Years of education: Not on file   Highest education level: Not on file  Occupational History   Not on file  Tobacco Use   Smoking status: Former    Types: Cigarettes    Quit date: 02/13/1974    Years since quitting: 47.7   Smokeless tobacco: Never  Substance and Sexual Activity   Alcohol use: No   Drug use: No   Sexual activity: Not on file  Other Topics Concern   Not on file   Social History Narrative   Not on file   Social Determinants of Health   Financial Resource Strain: Not on file  Food Insecurity: Not on file  Transportation Needs: Not on file  Physical Activity: Not on file  Stress: Not on file  Social Connections: Not on file  Intimate Partner Violence: Not on file    History reviewed. No pertinent family history.    Review of systems complete and found to be negative unless listed above    PHYSICAL EXAM General: Elderly and ill-appearing Caucasian male, in no acute distress.  Resting at angle against ICU bed wall with BiPAP in place  HEENT:  Normocephalic and atraumatic. Neck:  No JVD.  Lungs: Conversational dyspnea and tachypneic on BiPAP, O2 sat 100%.  Coarse breath sounds bilaterally to auscultation. Heart: Ventricularly paced at 80 bpm. Normal S1 and S2 without gallops or murmurs. Radial & DP pulses 2+ bilaterally.  Left chest with BiV AICD in place. Abdomen: distended appearing.  Msk: Normal strength and tone for age. Extremities: Edematous upper and lower extremities bilaterally. no clubbing, cyanosis. Neuro: Alert and oriented X 3. Psych:  Mood frustrated, affect congruent.   Labs:   Lab Results  Component Value Date   WBC 9.2 11/04/2021   HGB 13.1 11/04/2021   HCT 42.9 11/04/2021   MCV 94.3 11/04/2021   PLT 170 11/04/2021    Recent Labs  Lab 10/31/21 2008 11/01/21 0523 11/04/21 0400  NA 139   < > 139  K 3.2*   < > 4.0  CL 98   < > 102  CO2 31   < > 31  BUN 56*   < > 66*  CREATININE 2.93*   < > 2.17*  CALCIUM 9.1   < > 8.9  PROT 6.6  --   --   BILITOT 2.9*  --   --   ALKPHOS 92  --   --   ALT 52*  --   --   AST 57*  --   --   GLUCOSE 121*   < > 125*   < > = values in this interval not displayed.    Lab Results  Component Value Date   CKTOTAL 142 04/07/2012   CKMB 2.5 04/07/2012   TROPONINI 0.04 (H) 09/08/2015   No results found for: CHOL No results found for: HDL No results found for: LDLCALC No  results found for: TRIG No results found for: CHOLHDL No results found for: LDLDIRECT    Radiology: DG Chest 1 View  Result Date: 11/02/2021 CLINICAL DATA:  81 year old male with history of DVT. EXAM: CHEST  1 VIEW COMPARISON:  Chest x-ray 10/31/2021. FINDINGS: Lung volumes are normal. There is cephalization of the pulmonary vasculature and slight indistinctness of the interstitial markings suggestive of mild pulmonary edema. Trace left pleural effusion. No definite right pleural effusion. No pneumothorax. Moderate to severe cardiomegaly. The patient is rotated to the right on today's exam, resulting in distortion of the mediastinal contours and reduced diagnostic sensitivity and specificity for mediastinal pathology. Atherosclerotic calcifications in the thoracic aorta. Left-sided biventricular pacemaker/AICD with lead tips projecting over the expected location of the right atrium, right ventricle and lateral wall the left ventricle via the coronary sinus and coronary veins. IMPRESSION: 1. The appearance the chest suggest congestive heart failure, as above. Electronically Signed   By: Vinnie Langton M.D.   On: 11/02/2021 07:22   DG Abd 1 View  Result Date: 10/31/2021 CLINICAL DATA:  Abdominal pain. EXAM: ABDOMEN - 1 VIEW COMPARISON:  October 27, 2010 FINDINGS: Mild atelectatic changes are seen within the bilateral lung bases. The cardiac silhouette is markedly enlarged. The bowel gas pattern is normal. Multiple small radiopaque surgical coils are seen overlying the lower pelvis on the right. No radio-opaque calculi or other significant radiographic abnormality are seen. IMPRESSION: 1. Normal bowel gas pattern without evidence of renal calculi. 2. Mild bibasilar atelectasis. Electronically Signed   By: Virgina Norfolk M.D.   On: 10/31/2021 23:39   US Venous Img Upper Uni Left (DVT)  Result Date: 11/01/2021 CLINICAL DATA:  Bruising of left forearm status post fall 1 week ago Edema Pain EXAM:  LEFT UPPER EXTREMITY VENOUS DOPPLER ULTRASOUND TECHNIQUE: Gray-scale sonography with graded compression, as well as color Doppler and duplex ultrasound were performed to evaluate the upper extremity deep venous system from the level of the subclavian vein and including the jugular, axillary, basilic, radial, ulnar and upper cephalic vein. Spectral Doppler was utilized to evaluate flow at rest and with distal augmentation maneuvers. COMPARISON:  None. FINDINGS: Contralateral Subclavian Vein: Respiratory phasicity is normal and symmetric with the symptomatic side. No evidence of thrombus. Normal compressibility. Internal Jugular Vein: No evidence of thrombus. Normal compressibility, respiratory phasicity and response to augmentation. Subclavian Vein: No evidence of thrombus. Normal compressibility, respiratory phasicity and response to augmentation. Axillary Vein: No evidence of thrombus. Normal compressibility, respiratory phasicity and response to augmentation. Cephalic Vein: No evidence of thrombus. Normal compressibility, respiratory phasicity and response to augmentation. 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 Brachial Veins: No evidence of thrombus. Normal compressibility, respiratory phasicity and response to augmentation. Radial Veins: No evidence of thrombus. Normal compressibility, respiratory phasicity and response to augmentation. Ulnar Veins: No evidence of thrombus. Normal compressibility, respiratory phasicity and response to augmentation. Venous Reflux:  None visualized. Other Findings:  None visualized. IMPRESSION: 1. No left lower extremity DVT. 2. Minimal partially occlusive web-like thrombus in the proximal left basilic vein is consistent with chronic superficial venous thrombosis. Electronically Signed   By: Miachel Roux M.D.   On: 11/01/2021 17:29   DG Chest Port 1 View  Result Date: 10/31/2021 CLINICAL DATA:  Questionable sepsis.   Evaluate for abnormality. EXAM: PORTABLE CHEST 1 VIEW COMPARISON:  03/05/2021 FINDINGS: Multi lead ICD is identified with battery pack in the left chest wall. Marked cardiac enlargement is unchanged. There is blunting of the left costophrenic angle, new from previous exam. This may represent a small effusion. Diffuse pulmonary vascular congestion. No frank edema or airspace consolidation. Platelike atelectasis is noted in the right lung base.  IMPRESSION: 1. Cardiac enlargement, small left pleural effusion and pulmonary vascular congestion. Correlate for any signs or symptoms of CHF. 2. Right base atelectasis. Electronically Signed   By: Kerby Moors M.D.   On: 10/31/2021 20:26   DG Shoulder Left Port  Result Date: 11/01/2021 CLINICAL DATA:  Left shoulder pain EXAM: LEFT SHOULDER COMPARISON:  None. FINDINGS: Mild AC joint degenerative change. Moderate glenohumeral degenerative change. No fracture or dislocation. Faint calcifications at the superolateral humeral head. IMPRESSION: 1. No acute osseous abnormality 2. Moderate degenerative changes with calcific tendinopathy. Electronically Signed   By: Donavan Foil M.D.   On: 11/01/2021 16:10   ECHOCARDIOGRAM COMPLETE  Result Date: 11/01/2021    ECHOCARDIOGRAM REPORT   Patient Name:   LATHAN GIESELMAN Date of Exam: 11/01/2021 Medical Rec #:  [id number]    Height:       68.0 in Accession #:    [id number]   Weight:       184.7 lb Date of Birth:  Jun 25, 1941    BSA:          1.976 m Patient Age:    73 years     BP:           101/73 mmHg Patient Gender: M            HR:           78 bpm. Exam Location:  [facility] Procedure: 2D Echo, Cardiac Doppler and Color Doppler Indications:     CHF-acute diastolic O16.07                  CHF-acute systolic P71.06  History:         Patient has no prior history of Echocardiogram examinations.                  CHF; Risk Factors:Hypertension.  Sonographer:     Sherrie Sport Referring Phys:  [id number] [facility] OUMA Diagnosing Phys: Donnelly Angelica  Sonographer Comments: Suboptimal apical window. IMPRESSIONS  1. Left ventricular ejection fraction, by estimation, is <20%. The left ventricle has severely decreased function. The left ventricle demonstrates regional wall motion abnormalities (see scoring diagram/findings for description). The left ventricular internal cavity size was mildly dilated. Left ventricular diastolic parameters are indeterminate. There is severe hypokinesis of the left ventricular, entire inferior wall and inferolateral wall.  2. Right ventricular systolic function reduced. The right ventricular size is not well visualized.  3. Left atrial size was severely dilated.  4. Right atrial size was severely dilated.  5. The mitral valve is grossly normal. Moderate mitral valve regurgitation.  6. The aortic valve is normal in structure. Aortic valve regurgitation is not visualized. Aortic valve sclerosis is present, with no evidence of aortic valve stenosis.  7. Mild pulmonic stenosis. FINDINGS  Left Ventricle: Left ventricular ejection fraction, by estimation, is <20%. The left ventricle has severely decreased function. The left ventricle demonstrates regional wall motion abnormalities. Severe hypokinesis of the left ventricular, entire inferior wall and inferolateral wall. The left ventricular internal cavity size was mildly dilated. There is no left ventricular hypertrophy. Left ventricular diastolic parameters are indeterminate. Right Ventricle: The right ventricular size is not well visualized. Right vetricular wall thickness was not well visualized. Right ventricular systolic function reduced. Left Atrium: Left atrial size was severely dilated. Right Atrium: Right atrial size was severely dilated. Pericardium: There is no evidence of pericardial effusion. Mitral Valve: The mitral valve is grossly normal. Moderate mitral valve regurgitation. Tricuspid Valve: The  tricuspid valve is normal in structure. Tricuspid valve regurgitation is  not demonstrated. Aortic Valve: The aortic valve is normal in structure. Aortic valve regurgitation is not visualized. Aortic valve sclerosis is present, with no evidence of aortic valve stenosis. Aortic valve mean gradient measures 2.0 mmHg. Aortic valve peak gradient measures 3.4 mmHg. Aortic valve area, by VTI measures 2.47 cm. Pulmonic Valve: The pulmonic valve was not well visualized. Pulmonic valve regurgitation is not visualized. Mild pulmonic stenosis. Aorta: The aortic root is normal in size and structure. Venous: The inferior vena cava was not well visualized. IAS/Shunts: The interatrial septum was not well visualized.  LEFT VENTRICLE PLAX 2D LVIDd:         6.20 cm LVIDs:         5.70 cm LV PW:         1.40 cm LV IVS:        0.95 cm LVOT diam:     2.10 cm LV SV:         32 LV SV Index:   16 LVOT Area:     3.46 cm  LV Volumes (MOD) LV vol d, MOD A4C: 328.0 ml LV vol s, MOD A4C: 219.0 ml LV SV MOD A4C:     328.0 ml RIGHT VENTRICLE RV Basal diam:  4.70 cm RV S prime:     9.68 cm/s TAPSE (M-mode): 4.2 cm LEFT ATRIUM              Index         RIGHT ATRIUM           Index LA diam:        8.30 cm  4.20 cm/m    RA Area:     50.50 cm LA Vol (A2C):   554.0 ml 280.37 ml/m  RA Volume:   230.00 ml 116.40 ml/m LA Vol (A4C):   339.0 ml 171.56 ml/m LA Biplane Vol: 439.0 ml 222.17 ml/m  AORTIC VALVE                    PULMONIC VALVE AV Area (Vmax):    2.39 cm     PV Vmax:        0.60 m/s AV Area (Vmean):   2.21 cm     PV Vmean:       37.450 cm/s AV Area (VTI):     2.47 cm     PV VTI:         0.081 m AV Vmax:           91.80 cm/s   PV Peak grad:   1.4 mmHg AV Vmean:          62.100 cm/s  PV Mean grad:   1.0 mmHg AV VTI:            0.130 m      RVOT Peak grad: 3 mmHg AV Peak Grad:      3.4 mmHg AV Mean Grad:      2.0 mmHg LVOT Vmax:         63.30 cm/s LVOT Vmean:        39.600 cm/s LVOT VTI:          0.093 m LVOT/AV VTI ratio: 0.71  AORTA Ao Root diam: 3.10 cm MITRAL VALVE                TRICUSPID VALVE MV Area  (PHT): 4.93 cm     TR Peak grad:   16.8 mmHg  MV Decel Time: 154 msec     TR Vmax:        205.00 cm/s MV E velocity: 111.00 cm/s                             SHUNTS                             Systemic VTI:  0.09 m                             Systemic Diam: 2.10 cm                             Pulmonic VTI:  0.130 m Donnelly Angelica Electronically signed by Donnelly Angelica Signature Date/Time: 11/01/2021/12:52:46 PM    Final    [place] LT UPPER EXTREM LTD SOFT TISSUE NON VASCULAR  Result Date: 11/01/2021 CLINICAL DATA:  Left forearm bruise status post fall 1 week ago EXAM: ULTRASOUND LEFT UPPER EXTREMITY LIMITED TECHNIQUE: Ultrasound examination of the upper extremity soft tissues was performed in the area of clinical concern. COMPARISON:  None. FINDINGS: Targeted sonographic evaluation of the left forearm demonstrates a 1.5 x 0.6 x 1.0 cm complex fluid collection which is likely a hematoma. IMPRESSION: 1.5 x 0.6 x 1.0 cm complex fluid collection in the left forearm is likely a small hematoma. If patient's symptoms worsen, repeat ultrasound evaluation should be performed. Electronically Signed   By: Miachel Roux M.D.   On: 11/01/2021 17:30   CT HEAD CODE STROKE WO CONTRAST`  Result Date: 11/01/2021 CLINICAL DATA:  Code stroke.  Facial droop and slurred speech EXAM: CT HEAD WITHOUT CONTRAST TECHNIQUE: Contiguous axial images were obtained from the base of the skull through the vertex without intravenous contrast. COMPARISON:  01/07/2015 FINDINGS: Brain: No evidence of acute infarction, hemorrhage, hydrocephalus, extra-axial collection or mass lesion/mass effect. Vascular: No hyperdense vessel or unexpected calcification. Skull: Normal. Negative for fracture or focal lesion. Sinuses/Orbits: No acute finding. Other: These results were communicated to Dr Stark Klein at 6:03 am on 11/01/2021 by text page via the [facility] messaging system. ASPECTS [facility] Stroke Program Early CT Score) - Ganglionic level infarction (caudate, lentiform  nuclei, internal capsule, insula, M1-M3 cortex): 7 - Supraganglionic infarction (M4-M6 cortex): 3 Total score (0-10 with 10 being normal): 10 IMPRESSION: No acute finding.  No hemorrhage or visible infarct. Electronically Signed   By: Jorje Guild M.D.   On: 11/01/2021 06:05    ECHO 11/01/21 LVEF less than 20% with severely decreased function, severe hypokinesis of left ventricle, entire inferior wall and inferolateral wall.  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 -Increase IV Lasix 80 mg twice daily. Still volume overloaded. - Resume coreg 3.125 mg BID - Holding home Entresto, metolazone and  torsemide. Will resume as tolerated.   #Acute hypoxic respiratory failure 2/2 suspected volume overload and possible pneumonia Antibiotics and respiratory support per primary team  #Permanent atrial fibrillation on warfarin for anticoagulation; s/p AV node ablation - Resume amiodarone 200 mg daily (prescribed BID, but unclear reason) - Warfarin supratherapeutic 3.5 today, appreciate pharmacy assistance with dosing  #AKI on CKD Creatinine downtrending 2.2. Baseline is probably around 2.  Continue to monitor, replete electrolytes as needed  #Hypertension #History of DVT As above   Signed: Andrez Grime , MD 11/04/2021, 8:26 AM

## 2021-11-04 NOTE — Consult Note (Signed)
PHARMACY CONSULT NOTE - FOLLOW UP  Pharmacy Consult for Electrolyte Monitoring and Replacement   Recent Labs: Potassium (mmol/L)  Date Value  11/04/2021 4.0  04/07/2012 3.7   Magnesium (mg/dL)  Date Value  11/04/2021 2.1   Calcium (mg/dL)  Date Value  11/04/2021 8.9   Calcium, Total (mg/dL)  Date Value  04/07/2012 8.6   Albumin (g/dL)  Date Value  10/31/2021 3.8  04/07/2012 3.6   Phosphorus (mg/dL)  Date Value  11/04/2021 2.6   Sodium (mmol/L)  Date Value  11/04/2021 139  04/07/2012 141     Assessment: 81yo male with PMH of DVT, Ischemic Colitis, NICM (nonischemic cardiomyopathy), AKI (acute kidney injury), Chronic anticoagulation, HFrEF, Essential hypertension, ICD in place, Atrial fibrillation, Benign prostatic hyperplasia who was admitted to the hospital for SOB. Pharmacy was consulted for electrolyte replacement and monitoring.  Diuresis: Lasix 60 mg IV BID   Goal of Therapy:  Electrolytes WNL  Plan:  --Dysphagia 1 diet --no replacement warranted currently. --Will continue to monitor and replace electrolytes as clinically indicated   Noralee Space, PharmD Clinical Pharmacist 11/04/2021 8:27 AM

## 2021-11-04 NOTE — Consult Note (Signed)
ANTICOAGULATION CONSULT NOTE  Pharmacy Consult for warfarin  Indication: atrial fibrillation and LV thrombus  Allergies  Allergen Reactions   Escitalopram Oxalate Nausea Only   Oxycodone     vomiting    Patient Measurements: Height: 5\' 8"  (172.7 cm) Weight: 82.9 kg (182 lb 12.2 oz) IBW/kg (Calculated) : 68.4  Vital Signs: Temp: 97.6 F (36.4 C) (01/01 0726) Temp Source: Axillary (01/01 0726) BP: 100/71 (01/01 0800) Pulse Rate: 81 (01/01 0800)  Labs: Recent Labs    11/02/21 0342 11/03/21 0348 11/04/21 0400  HGB  --  13.8 13.1  HCT  --  45.1 42.9  PLT  --  142* 170  LABPROT 35.0* 39.4* 41.0*  INR 3.5* 4.1* 4.3*  CREATININE 2.29* 2.21* 2.17*     Estimated Creatinine Clearance: 29.4 mL/min (A) (by C-G formula based on SCr of 2.17 mg/dL (H)).   Medical History: Past Medical History:  Diagnosis Date   Acute urinary retention 03/05/2021   Anxiety    CHF (congestive heart failure) ([place])    Claustrophobia    Colitis, ischemic ([place]) 2010   developed DVT   DVT (deep venous thrombosis) ([place]) 2010   stomach and leg   Dysrhythmia    A-Fib   History of 2019 novel coronavirus disease (COVID-19) 11/22/2019   Hypertension      Assessment: 81yo male with PMH of DVT, Ischemic Colitis, NICM (nonischemic cardiomyopathy), AKI (acute kidney injury), Chronic anticoagulation, Chronic systolic CHF, NYHA class 2, Essential hypertension, ICD in place, Atrial fibrillation, Benign prostatic hyperplasia who was admitted to the hospital for SOB. Pharmacy was consulted for warfarin dosing and monitoring.  PTA regimen: 2mg  daily (TWD=14 mg) (confirmed with PCP)  Last dose: 12/27  [id number] (on both PTA)  Date/time INR Warfarin Dose 12/28   3.9 HOLD 12/29   3.9 HOLD 12/30   3.5 HOLD 12/31   4.1 HOLD 1/1  4.3 HOLD  Goal of Therapy:  INR 2-3 Monitor platelets by anticoagulation protocol: Yes   Plan:  --INR supratherapeutic again today --Hold warfarin again tonight given  supratherapeutic INR  --Dysphagia 1 diet ordered 12/31 --Check INR with AM labs  Noralee Space, PharmD Clinical Pharmacist 11/04/2021 8:32 AM

## 2021-11-04 NOTE — Progress Notes (Signed)
PROGRESS NOTE    Adrian Chavez  LEX:[id number] DOB: August 27, 1941 DOA: 10/31/2021 PCP: Dion Body, MD    Brief Narrative:  81 y.o. 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    In the ER, dyspneic, SpO2 70%, hypotensive.  Cr 2.93 from baseline 1.6, BNP >4500, pH 7.29, pCO2 67.  Placed on BiPAP, given empiric fluids and antibiotics and admitted to ICU.   12/28: Admitted to ICU on BiPAP 12/30: Remained on BiPAP, but improving, Cardiology consulted for diuresis 12/31: Stable for transfer to SDU, remains on IV Lasix, off antibiotics 1/1: Patient remains medically stable.  Continue to diurese.  Low suspicion for infection.  Antibiotics remain on hold   Assessment & Plan:   Principal Problem:   Acute on chronic respiratory failure with hypoxia and hypercapnia (HCC) Active Problems:   Benign prostatic hyperplasia with lower urinary tract symptoms   AF (paroxysmal atrial fibrillation) (HCC)   Acute on chronic combined systolic and diastolic CHF (congestive heart failure) (HCC)   Essential hypertension   NICM (nonischemic cardiomyopathy) (HCC)   Chronic anticoagulation   Acute renal failure (ARF) (HCC)   Pulmonary hypertension (HCC)   Moderate to severe mitral regurgitation   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HCC)   OSA (obstructive sleep apnea)  Acute on chronic heart failure with reduced ejection fraction, LVEF less than 20% Status post BiV ICD Cardiogenic shock, now resolved Nonischemic dilated cardiomyopathy Initially required vasopressor support, wean off as of 12/30 Remains volume overloaded Plan: Transfer to cardiac telemetry Lasix 80 mg IV twice daily Coreg 3.125 mg twice daily Currently holding home Entresto, metolazone, torsemide  Acute  hypoxic respiratory failure secondary to volume overload Infectious process felt unlikely Patient without fevers, leukocytosis, clear source of infection Received 1 dose of Rocephin in ED Antibiotics on hold  Primary atrial fibrillation Chronic anticoagulation on warfarin Plan: Amiodarone 200 mg daily Warfarin pharmacy dosing assistance Telemetry monitoring  AKI on CKD stage IIIb Creatinine downtrending Avoid nonessential nephrotoxins Careful monitoring of electrolytes while on diuresis  Hypertension History of VTE Medication management as above  DVT prophylaxis: Warfarin Code Status: DNR Family Communication: None today Disposition Plan: Status is: Inpatient  Remains inpatient appropriate because: Cardiogenic shock, now resolved.  Acute decompensated heart failure.  Disposition plan pending.       Level of care: Telemetry Cardiac  Consultants:  Cardiology-[facility]  Procedures:  None  Antimicrobials: None   Subjective: Patient seen and examined.  Resting comfortably in bed.  No visible distress.  No pain complaints.  Objective: Vitals:   11/04/21 0600 11/04/21 0700 11/04/21 0726 11/04/21 0800  BP: 106/80 96/77  100/71  Pulse: 79 80 80 81  Resp: (!) 21 16 (!) 21 16  Temp:   97.6 F (36.4 C)   TempSrc:   Axillary   SpO2: 98% 95% 97% 97%  Weight:      Height:        Intake/Output Summary (Last 24 hours) at 11/04/2021 1332 Last data filed at 11/04/2021 1200 Gross per 24 hour  Intake 480 ml  Output 1990 ml  Net -1510 ml   Filed Weights   11/02/21 0500 11/02/21 1400 11/04/21 0123  Weight: 84.7 kg 86.1 kg 82.9 kg    Examination:  General  exam: No acute distress Respiratory system: Scattered crackles bilaterally.  Normal work of breathing.  2 L Cardiovascular system: S1-S2, RRR, no murmurs, 2+ pitting edema bilateral Gastrointestinal system: Mild distended, nontender, positive bowel sounds Central nervous system: Alert and oriented. No focal  neurological deficits. Extremities: Symmetric 5 x 5 power. Skin: No rashes, lesions or ulcers Psychiatry: Judgement and insight appear normal. Mood & affect appropriate.     Data Reviewed: I have personally reviewed following labs and imaging studies  CBC: Recent Labs  Lab 10/31/21 2008 11/01/21 0523 11/03/21 0348 11/04/21 0400  WBC 3.9* 4.1 5.7 9.2  NEUTROABS 2.1  --   --   --   HGB 14.6 13.2 13.8 13.1  HCT 48.0 43.2 45.1 42.9  MCV 95.8 94.5 94.4 94.3  PLT 174 146* 142* 976   Basic Metabolic Panel: Recent Labs  Lab 10/31/21 2008 11/01/21 0523 11/01/21 1818 11/02/21 0342 11/03/21 0348 11/04/21 0400  NA 139 139 141 142 143 139  K 3.2* 3.0* 3.0* 3.4* 4.0 4.0  CL 98 100 101 103 104 102  CO2 31 31 28 30 28 31   GLUCOSE 121* 84  --  78 123* 125*  BUN 56* 54*  --  46* 52* 66*  CREATININE 2.93* 2.68*  --  2.29* 2.21* 2.17*  CALCIUM 9.1 8.7*  --  8.8* 8.8* 8.9  MG  --  2.3  --  2.0 2.0 2.1  PHOS  --  4.9*  --  4.5 4.1 2.6   GFR: Estimated Creatinine Clearance: 29.4 mL/min (A) (by C-G formula based on SCr of 2.17 mg/dL (H)). Liver Function Tests: Recent Labs  Lab 10/31/21 2008  AST 57*  ALT 52*  ALKPHOS 92  BILITOT 2.9*  PROT 6.6  ALBUMIN 3.8   No results for input(s): LIPASE, AMYLASE in the last 168 hours. No results for input(s): AMMONIA in the last 168 hours. Coagulation Profile: Recent Labs  Lab 10/31/21 2146 11/01/21 0523 11/02/21 0342 11/03/21 0348 11/04/21 0400  INR 3.9* 3.9* 3.5* 4.1* 4.3*   Cardiac Enzymes: No results for input(s): CKTOTAL, CKMB, CKMBINDEX, TROPONINI in the last 168 hours. BNP (last 3 results) No results for input(s): PROBNP in the last 8760 hours. HbA1C: No results for input(s): HGBA1C in the last 72 hours. CBG: Recent Labs  Lab 10/31/21 2342 11/01/21 1726  GLUCAP 88 98   Lipid Profile: No results for input(s): CHOL, HDL, LDLCALC, TRIG, CHOLHDL, LDLDIRECT in the last 72 hours. Thyroid Function Tests: No results for  input(s): TSH, T4TOTAL, FREET4, T3FREE, THYROIDAB in the last 72 hours. Anemia Panel: No results for input(s): VITAMINB12, FOLATE, FERRITIN, TIBC, IRON, RETICCTPCT in the last 72 hours. Sepsis Labs: Recent Labs  Lab 10/31/21 2008 10/31/21 2028 10/31/21 2354  PROCALCITON  --  <0.10  --   LATICACIDVEN 1.7  --  1.6    Recent Results (from the past 240 hour(s))  Blood Culture (routine x 2)     Status: None (Preliminary result)   Collection Time: 10/31/21  8:08 PM   Specimen: BLOOD  Result Value Ref Range Status   Specimen Description BLOOD LEFT ANTECUBITAL  Final   Special Requests   Final    BOTTLES DRAWN AEROBIC AND ANAEROBIC Blood Culture adequate volume   Culture   Final    NO GROWTH 4 DAYS Performed at [facility], [address], [place], [place] [address]    Report Status PENDING  Incomplete  Blood Culture (routine x 2)     Status: None (  Preliminary result)   Collection Time: 10/31/21  8:13 PM   Specimen: BLOOD  Result Value Ref Range Status   Specimen Description BLOOD BLOOD LEFT FOREARM  Final   Special Requests   Final    BOTTLES DRAWN AEROBIC AND ANAEROBIC Blood Culture adequate volume   Culture   Final    NO GROWTH 4 DAYS Performed at [facility], [address] [place], [place] [address]    Report Status PENDING  Incomplete  Resp Panel by RT-PCR (Flu A&B, Covid) Nasopharyngeal Swab     Status: None   Collection Time: 10/31/21  8:28 PM   Specimen: Nasopharyngeal Swab; Nasopharyngeal(NP) swabs in vial transport medium  Result Value Ref Range Status   SARS Coronavirus 2 by RT PCR NEGATIVE NEGATIVE Final    Comment: (NOTE) SARS-CoV-2 target nucleic acids are NOT DETECTED.  The SARS-CoV-2 RNA is generally detectable in upper respiratory specimens during the acute phase of infection. The lowest concentration of SARS-CoV-2 viral copies this assay can detect is 138 copies/mL. A negative result does not preclude SARS-Cov-2 infection and should  not be used as the sole basis for treatment or other patient management decisions. A negative result may occur with  improper specimen collection/handling, submission of specimen other than nasopharyngeal swab, presence of viral mutation(s) within the areas targeted by this assay, and inadequate number of viral copies(<138 copies/mL). A negative result must be combined with clinical observations, patient history, and epidemiological information. The expected result is Negative.  Fact Sheet for Patients:  EntrepreneurPulse.com.au  Fact Sheet for Healthcare Providers:  IncredibleEmployment.be  This test is no t yet approved or cleared by the [place] FDA and  has been authorized for detection and/or diagnosis of SARS-CoV-2 by FDA under an Emergency Use Authorization (EUA). This EUA will remain  in effect (meaning this test can be used) for the duration of the COVID-19 declaration under Section 564(b)(1) of the Act, 21 U.S.C.section 360bbb-3(b)(1), unless the authorization is terminated  or revoked sooner.       Influenza A by PCR NEGATIVE NEGATIVE Final   Influenza B by PCR NEGATIVE NEGATIVE Final    Comment: (NOTE) The Xpert Xpress SARS-CoV-2/FLU/RSV plus assay is intended as an aid in the diagnosis of influenza from Nasopharyngeal swab specimens and should not be used as a sole basis for treatment. Nasal washings and aspirates are unacceptable for Xpert Xpress SARS-CoV-2/FLU/RSV testing.  Fact Sheet for Patients: EntrepreneurPulse.com.au  Fact Sheet for Healthcare Providers: IncredibleEmployment.be  This test is not yet approved or cleared by the [place] FDA and has been authorized for detection and/or diagnosis of SARS-CoV-2 by FDA under an Emergency Use Authorization (EUA). This EUA will remain in effect (meaning this test can be used) for the duration of the COVID-19 declaration under  Section 564(b)(1) of the Act, 21 U.S.C. section 360bbb-3(b)(1), unless the authorization is terminated or revoked.  Performed at [facility], [address] [place], [place] [address]   MRSA Next Gen by PCR, Nasal     Status: None   Collection Time: 10/31/21 11:59 PM   Specimen: Nasal Mucosa; Nasal Swab  Result Value Ref Range Status   MRSA by PCR Next Gen NOT DETECTED NOT DETECTED Final    Comment: (NOTE) The GeneXpert MRSA Assay (FDA approved for NASAL specimens only), is one component of a comprehensive MRSA colonization surveillance program. It is not intended to diagnose MRSA infection nor to guide or monitor treatment for MRSA infections. Test performance is not FDA  approved in patients less than 7 years old. Performed at [facility], [address] [place], [place] [address]   Urine Culture     Status: Abnormal   Collection Time: 11/01/21  1:00 AM   Specimen: Urine, Random  Result Value Ref Range Status   Specimen Description   Final    URINE, RANDOM Performed at [facility], [address] [place], [place] [address]    Special Requests   Final    NONE Performed at [facility], [place], [place] [address]    Culture (A)  Final    70,000 COLONIES/mL KLEBSIELLA OXYTOCA 50,000 COLONIES/mL ESCHERICHIA COLI    Report Status 11/04/2021 FINAL  Final   Organism ID, Bacteria KLEBSIELLA OXYTOCA (A)  Final   Organism ID, Bacteria ESCHERICHIA COLI (A)  Final      Susceptibility   Escherichia coli - MIC*    AMPICILLIN 4 SENSITIVE Sensitive     CEFAZOLIN <=4 SENSITIVE Sensitive     CEFEPIME <=0.12 SENSITIVE Sensitive     CEFTRIAXONE <=0.25 SENSITIVE Sensitive     CIPROFLOXACIN >=4 RESISTANT Resistant     GENTAMICIN <=1 SENSITIVE Sensitive     IMIPENEM <=0.25 SENSITIVE Sensitive     NITROFURANTOIN <=16 SENSITIVE Sensitive     TRIMETH/SULFA <=20 SENSITIVE Sensitive     AMPICILLIN/SULBACTAM <=2 SENSITIVE Sensitive      PIP/TAZO <=4 SENSITIVE Sensitive     * 50,000 COLONIES/mL ESCHERICHIA COLI   Klebsiella oxytoca - MIC*    AMPICILLIN >=32 RESISTANT Resistant     CEFAZOLIN 8 SENSITIVE Sensitive     CEFEPIME <=0.12 SENSITIVE Sensitive     CEFTRIAXONE <=0.25 SENSITIVE Sensitive     CIPROFLOXACIN <=0.25 SENSITIVE Sensitive     GENTAMICIN <=1 SENSITIVE Sensitive     IMIPENEM <=0.25 SENSITIVE Sensitive     NITROFURANTOIN 32 SENSITIVE Sensitive     TRIMETH/SULFA <=20 SENSITIVE Sensitive     AMPICILLIN/SULBACTAM 16 INTERMEDIATE Intermediate     PIP/TAZO <=4 SENSITIVE Sensitive     * 70,000 COLONIES/mL KLEBSIELLA OXYTOCA         Radiology Studies: No results found.      Scheduled Meds:  amiodarone  200 mg Oral Daily   budesonide (PULMICORT) nebulizer solution  0.25 mg Nebulization BID   carvedilol  3.125 mg Oral BID WC   chlorhexidine  15 mL Mouth Rinse BID   Chlorhexidine Gluconate Cloth  6 each Topical Daily   furosemide  80 mg Intravenous BID   mouth rinse  15 mL Mouth Rinse q12n4p   Warfarin - Pharmacist Dosing Inpatient   Does not apply q1600   Continuous Infusions:   LOS: 4 days    Time spent: 35 minutes    Sidney Ace, MD [facility]   If 7PM-7AM, please contact night-coverage  11/04/2021, 1:32 PM

## 2021-11-04 NOTE — Progress Notes (Signed)
NP notified of critical INR of 4.3, acknowledged and no new orders at this time. Continue to monitor.

## 2021-11-05 LAB — BASIC METABOLIC PANEL
Anion gap: 10 (ref 5–15)
BUN: 58 mg/dL — ABNORMAL HIGH (ref 8–23)
CO2: 35 mmol/L — ABNORMAL HIGH (ref 22–32)
Calcium: 8.8 mg/dL — ABNORMAL LOW (ref 8.9–10.3)
Chloride: 98 mmol/L (ref 98–111)
Creatinine, Ser: 1.81 mg/dL — ABNORMAL HIGH (ref 0.61–1.24)
GFR, Estimated: 38 mL/min — ABNORMAL LOW (ref >60)
Glucose, Bld: 100 mg/dL — ABNORMAL HIGH (ref 70–99)
Potassium: 3.6 mmol/L (ref 3.5–5.1)
Sodium: 143 mmol/L (ref 135–145)

## 2021-11-05 LAB — PHOSPHORUS: Phosphorus: 2.6 mg/dL (ref 2.5–4.6)

## 2021-11-05 LAB — BRAIN NATRIURETIC PEPTIDE: B Natriuretic Peptide: 3695 pg/mL — ABNORMAL HIGH (ref 0.0–100.0)

## 2021-11-05 LAB — PROTIME-INR
INR: 3.3 — ABNORMAL HIGH (ref 0.8–1.2)
Prothrombin Time: 33.5 seconds — ABNORMAL HIGH (ref 11.4–15.2)

## 2021-11-05 LAB — MAGNESIUM: Magnesium: 2.1 mg/dL (ref 1.7–2.4)

## 2021-11-05 MED ORDER — WARFARIN SODIUM 2 MG PO TABS
2.0000 mg | ORAL_TABLET | Freq: Once | ORAL | Status: AC
  Administered 2021-11-05: 2 mg via ORAL
  Filled 2021-11-05: qty 1

## 2021-11-05 MED ORDER — ENSURE ENLIVE PO LIQD
237.0000 mL | Freq: Two times a day (BID) | ORAL | Status: DC
  Administered 2021-11-05 – 2021-11-06 (×2): 237 mL via ORAL

## 2021-11-05 MED ORDER — LOSARTAN POTASSIUM 25 MG PO TABS
12.5000 mg | ORAL_TABLET | Freq: Every day | ORAL | Status: DC
  Administered 2021-11-05 – 2021-11-07 (×2): 12.5 mg via ORAL
  Filled 2021-11-05 (×3): qty 0.5

## 2021-11-05 MED ORDER — ADULT MULTIVITAMIN W/MINERALS CH
1.0000 | ORAL_TABLET | Freq: Every day | ORAL | Status: DC
  Administered 2021-11-05 – 2021-11-08 (×4): 1 via ORAL
  Filled 2021-11-05 (×4): qty 1

## 2021-11-05 MED ORDER — POTASSIUM CHLORIDE 20 MEQ PO PACK
40.0000 meq | PACK | Freq: Once | ORAL | Status: AC
  Administered 2021-11-05: 40 meq via ORAL
  Filled 2021-11-05: qty 2

## 2021-11-05 NOTE — Consult Note (Incomplete)
ANTICOAGULATION CONSULT NOTE  Pharmacy Consult for warfarin  Indication: atrial fibrillation and LV thrombus  Allergies  Allergen Reactions   Escitalopram Oxalate Nausea Only   Oxycodone     vomiting    Patient Measurements: Height: 5\' 8"  (172.7 cm) Weight: 82 kg (180 lb 12.4 oz) IBW/kg (Calculated) : 68.4  Vital Signs: Temp: 97.5 F (36.4 C) (01/02 0115) Temp Source: Oral (01/02 0115) BP: 102/82 (01/02 0400) Pulse Rate: 80 (01/02 0700)  Labs: Recent Labs    11/03/21 0348 11/04/21 0400 11/05/21 0445  HGB 13.8 13.1  --   HCT 45.1 42.9  --   PLT 142* 170  --   LABPROT 39.4* 41.0*  --   INR 4.1* 4.3*  --   CREATININE 2.21* 2.17* 1.81*     Estimated Creatinine Clearance: 32.5 mL/min (A) (by C-G formula based on SCr of 1.81 mg/dL (H)).   Medical History: Past Medical History:  Diagnosis Date   Acute urinary retention 03/05/2021   Anxiety    CHF (congestive heart failure) ([place])    Claustrophobia    Colitis, ischemic ([place]) 2010   developed DVT   DVT (deep venous thrombosis) ([place]) 2010   stomach and leg   Dysrhythmia    A-Fib   History of 2019 novel coronavirus disease (COVID-19) 11/22/2019   Hypertension      Assessment: 81yo male with PMH of DVT, Ischemic Colitis, NICM (nonischemic cardiomyopathy), AKI (acute kidney injury), Chronic anticoagulation, Chronic systolic CHF, NYHA class 2, Essential hypertension, ICD in place, Atrial fibrillation, Benign prostatic hyperplasia who was admitted to the hospital for SOB. Pharmacy was consulted for warfarin dosing and monitoring.  PTA regimen: 2mg  daily (TWD=14 mg) (confirmed with PCP)  Last dose: 12/27  ATF:TDDUKGURKY (on both PTA)  Date/time INR Warfarin Dose 12/28   3.9 HOLD 12/29   3.9 HOLD 12/30   3.5 HOLD 12/31   4.1 HOLD 1/1  4.3 HOLD  Goal of Therapy:  INR 2-3 Monitor platelets by anticoagulation protocol: Yes   Plan:  --INR supratherapeutic again today --Hold warfarin again tonight given  supratherapeutic INR  --Dysphagia 1 diet ordered 12/31 --Check INR with AM labs  Narda Rutherford, PharmD Clinical Pharmacist 11/05/2021 7:33 AM

## 2021-11-05 NOTE — Progress Notes (Signed)
NAME:  Adrian Chavez, MRN:  [id number], DOB:  06-Nov-1940, LOS: 5 ADMISSION DATE:  10/31/2021, CONSULTATION DATE:  10/31/2021 REFERRING MD:  Duffy Bruce MD  CHIEF COMPLAINT: Shortness of Breath   SYNOPSIS  81 y.o male  with significant PMH of DVT, Ischemic Colitis, NICM (nonischemic cardiomyopathy), AKI (acute kidney injury), Chronic anticoagulation, History of 2019 novel coronavirus disease ([id number]), Chronic systolic CHF (congestive heart failure), NYHA class 2, Essential hypertension, ICD (implantable cardioverter-defibrillator) in place, Atrial fibrillation, Benign prostatic hyperplasia with lower urinary tract symptoms who presented to the ED with chief complaints of progressive shortness of breath.  Per family members at the bedside, EMS was called for complaints of progressive SOB. The family members reported that the patient has been more weak for a week due to nausea, vomiting, and diarrhea. No reports of fever, chills, chest pain, abdominal pain, or cough. On EMS arrival, patient was already on NRB, he was hypotensive, but alert and oriented. After IV access was obtained, responders began administering fluid and placing the patient on the stair chair. Enroute the patient  received IV fluids. The patient remained alert and oriented, but became lethargic upon arrival to the hospital.   ED Course: In the emergency department, the temperature was 36.1C, the heart rate 80 beats/minute, the blood pressure 98/85  mm Hg, the respiratory rate 19 breaths/minute, and the oxygen saturation 96% on 15% NRB. He was lethargic, and responded with one-word answers; symmetric movement in the arms and legs was observed. An electrocardiogram showed ventricular paced rhythm with rate 60 and prolonged Qtc.   Pertinent Labs in Red/Diagnostics Findings: Na+/ K+: 139/3.2 Glucose: 121 BUN/Cr.: 56/2.93 AST/ALT: 57/52  WBC: 3.9 PCT: <0.10 Lactic acid: 1.2 COVID PCR: Negative   Troponin:  48 BNP:>4500 Arterial Blood Gas result:  pO2 300; pCO2 67; pH 7.29;  HCO3 32.2, %O2 Sat 99.9.  Patient was placed on BiPAP due to VBG finding concerning for worsening hypoxia and hypercapnea. Patient received additional 500 cc IV fluids bolus and started on broad spectrum antibiotics for suspected infectious process. Due to high risk for decompensation and repeat abnormal ABG findings, PCCM ask to admit to ICU for further management.    Past Medical History   DVT   Ischemic Colitis 03/05/2021  NICM (nonischemic cardiomyopathy) ([place]) 03/05/2021  AKI (acute kidney injury) ([place]) 03/05/2021  Chronic anticoagulation 03/05/2021  History of 2019 novel coronavirus disease (COVID-19) 02/72/5366  Chronic systolic CHF (congestive heart failure), NYHA class 2 ([place]) 02/26/2018  Essential hypertension 05/17/2014  ICD (implantable cardioverter-defibrillator) in place 05/17/2014  Atrial fibrillation ([place]) 03/03/2014  Benign prostatic hyperplasia with lower urinary tract symptoms 07/10/2012     INTERVAL CHANGES Signs of aspiration  Awaiting speech eval Sitting in chair this AM More alert and awake Advanced HF      [facility] Events   10/31/21> Admitted to ICU with acute hypoxic hypercapnic respiratory failure 12/30 remains on biPAP, severe Hypoxic resp failure 12/31 SD status, can transfer to [place]:  Cardiology  Procedures:  None  Significant Diagnostic Tests:  12/28: Chest Xray>Cardiac enlargement, small left pleural effusion and pulmonary vascular congestion. Correlate for any signs or symptoms of CHF. 2. Right base atelectasis. 12/28: Abdominal xray>  Micro Data:  12/28: SARS-CoV-2 PCR> negative 12/28: Influenza PCR> negative 12/28: Blood culture x2> 12/28: Urine Culture> 12/28: MRSA PCR>>  12/28: Strep pneumo urinary antigen> 12/28: Legionella urinary antigen>  Antimicrobials:  Vancomycin 12/28 x 1 Cefepime 12/28 x 1 Metronidazole 12/28 x  1  OBJECTIVE  Blood pressure 99/77, pulse 81, temperature 97.6 F (36.4 C), temperature source Oral, resp. rate 13, height 5\' 8"  (1.727 m), weight 82 kg, SpO2 96 %.    FiO2 (%):  [28 %] 28 %   Intake/Output Summary (Last 24 hours) at 11/05/2021 0853 Last data filed at 11/05/2021 5009 Gross per 24 hour  Intake 1080 ml  Output 2585 ml  Net -1505 ml    Filed Weights   11/02/21 1400 11/04/21 0123 11/05/21 0213  Weight: 86.1 kg 82.9 kg 82 kg     Review of Systems: +SOB Alert and awake Other:  All other systems negative  Physical Examination:   General Appearance: No distress  EYES PERRLA, EOM intact.   NECK Supple, No JVD Pulmonary: normal breath sounds, No wheezing.  CardiovascularNormal S1,S2.  No m/r/g.   Neuro:without focal findings,  speech normal  PSYCHIATRIC: Mood, affect within normal limits.    Labs/imaging that I havepersonally reviewed  (right click and "Reselect all SmartList Selections" daily)  I, personally viewed and interpreted this ECG. EKG Interpretation Date: 10/31/2021 V.paced. rate 60 BPM PR interval * ms QRS duration 176 ms QT/QTcB 554/554 ms P-R-T axes * -87 90   Labs   CBC: Recent Labs  Lab 10/31/21 2008 11/01/21 0523 11/03/21 0348 11/04/21 0400  WBC 3.9* 4.1 5.7 9.2  NEUTROABS 2.1  --   --   --   HGB 14.6 13.2 13.8 13.1  HCT 48.0 43.2 45.1 42.9  MCV 95.8 94.5 94.4 94.3  PLT 174 146* 142* 170     Basic Metabolic Panel: Recent Labs  Lab 11/01/21 0523 11/01/21 1818 11/02/21 0342 11/03/21 0348 11/04/21 0400 11/05/21 0445  NA 139 141 142 143 139 143  K 3.0* 3.0* 3.4* 4.0 4.0 3.6  CL 100 101 103 104 102 98  CO2 31 28 30 28 31  35*  GLUCOSE 84  --  78 123* 125* 100*  BUN 54*  --  46* 52* 66* 58*  CREATININE 2.68*  --  2.29* 2.21* 2.17* 1.81*  CALCIUM 8.7*  --  8.8* 8.8* 8.9 8.8*  MG 2.3  --  2.0 2.0 2.1 2.1  PHOS 4.9*  --  4.5 4.1 2.6 2.6    GFR: Estimated Creatinine Clearance: 32.5 mL/min (A) (by C-G formula based on  SCr of 1.81 mg/dL (H)). Recent Labs  Lab 10/31/21 2008 10/31/21 2028 10/31/21 2354 11/01/21 0523 11/03/21 0348 11/04/21 0400  PROCALCITON  --  <0.10  --   --   --   --   WBC 3.9*  --   --  4.1 5.7 9.2  LATICACIDVEN 1.7  --  1.6  --   --   --      Liver Function Tests: Recent Labs  Lab 10/31/21 2008  AST 57*  ALT 52*  ALKPHOS 92  BILITOT 2.9*  PROT 6.6  ALBUMIN 3.8    No results for input(s): LIPASE, AMYLASE in the last 168 hours. No results for input(s): AMMONIA in the last 168 hours.  ABG    Component Value Date/Time   PHART 7.29 (L) 10/31/2021 2211   PCO2ART 67 (HH) 10/31/2021 2211   PO2ART 300 (H) 10/31/2021 2211   HCO3 32.2 (H) 10/31/2021 2211   O2SAT 99.9 10/31/2021 2211      Coagulation Profile: Recent Labs  Lab 10/31/21 2146 11/01/21 0523 11/02/21 0342 11/03/21 0348 11/04/21 0400  INR 3.9* 3.9* 3.5* 4.1* 4.3*     Cardiac Enzymes: No results for input(s): CKTOTAL, CKMB,  CKMBINDEX, TROPONINI in the last 168 hours.  HbA1C: No results found for: HGBA1C  CBG:  Assessment & Plan:   Acute Hypoxic Hypercapnic Respiratory Failure secondary to Pulmonary Edema and severe acute heart failure with acute renal failure c/w progressive cardiorenal syndome -Supplemental O2 as needed to maintain O2 saturations 88 to 92% Wean to high flow [place] CPAP as needed -patient improved - family at bedside    [place]- Acute on Chronic Systolic CHF (Last known LVEF 15-20%) -oxygen as needed -Lasix as tolerated -follow up cardiac enzymes as indicated -follow up cardiology - recs appreciate input   Permanent Atrial Fibrillation  Nonischemic dilated cardiomyopathy -He remains rate controlled and vpaced -Hold Amiodarone for now, Qtc prolonged  and unable to tolerate po -CHADS2VASc  -Patient on Coumadin at home, PT/INR supra-therapeutic, hold anticoagulation. pharmacy consult for management.  ACUTE KIDNEY INJURY/Renal Failure -continue Foley  Catheter-assess need -Avoid nephrotoxic agents -Follow urine output, BMP -Ensure adequate renal perfusion, optimize oxygenation -Renal dose medications   Intake/Output Summary (Last 24 hours) at 11/05/2021 0853 Last data filed at 11/05/2021 9381 Gross per 24 hour  Intake 1080 ml  Output 2585 ml  Net -1505 ml     Best practice:  Diet:  NPO Pain/Anxiety/Delirium protocol (if indicated): No VAP protocol (if indicated): Not indicated DVT prophylaxis: Contraindicated GI prophylaxis: PPI Glucose control:  SSI No Central venous access:  N/A Arterial line:  N/A Foley:  Yes, and it is still needed Mobility:  bed rest  PT consulted: N/A Last date of multidisciplinary goals of care discussion [12/28] Code Status:  DNR/DNI Disposition: SD status   SD status Plan to transfer to [facility] Ottie Glazier, M.D.  Pulmonary & Critical Care Medicine

## 2021-11-05 NOTE — Consult Note (Incomplete)
PHARMACY CONSULT NOTE - FOLLOW UP  Pharmacy Consult for Electrolyte Monitoring and Replacement   Recent Labs: Potassium (mmol/L)  Date Value  11/05/2021 3.6  04/07/2012 3.7   Magnesium (mg/dL)  Date Value  11/05/2021 2.1   Calcium (mg/dL)  Date Value  11/05/2021 8.8 (L)   Calcium, Total (mg/dL)  Date Value  04/07/2012 8.6   Albumin (g/dL)  Date Value  10/31/2021 3.8  04/07/2012 3.6   Phosphorus (mg/dL)  Date Value  11/05/2021 2.6   Sodium (mmol/L)  Date Value  11/05/2021 143  04/07/2012 141     Assessment: 81yo male with PMH of DVT, Ischemic Colitis, NICM (nonischemic cardiomyopathy), AKI (acute kidney injury), Chronic anticoagulation, HFrEF, Essential hypertension, ICD in place, Atrial fibrillation, Benign prostatic hyperplasia who was admitted to the hospital for SOB. Pharmacy was consulted for electrolyte replacement and monitoring.  Diuresis: Lasix 60 mg IV BID   Goal of Therapy:  Electrolytes WNL  Plan:  --Dysphagia 1 diet --no replacement warranted currently. --Will continue to monitor and replace electrolytes as clinically indicated   Narda Rutherford, PharmD Clinical Pharmacist 11/05/2021 7:32 AM

## 2021-11-05 NOTE — Consult Note (Signed)
PHARMACY CONSULT NOTE  Pharmacy Consult for Electrolyte Monitoring and Replacement   Recent Labs: Potassium (mmol/L)  Date Value  11/05/2021 3.6  04/07/2012 3.7   Magnesium (mg/dL)  Date Value  11/05/2021 2.1   Calcium (mg/dL)  Date Value  11/05/2021 8.8 (L)   Calcium, Total (mg/dL)  Date Value  04/07/2012 8.6   Albumin (g/dL)  Date Value  10/31/2021 3.8  04/07/2012 3.6   Phosphorus (mg/dL)  Date Value  11/05/2021 2.6   Sodium (mmol/L)  Date Value  11/05/2021 143  04/07/2012 141    Assessment: 81yo male with PMH of DVT, Ischemic Colitis, NICM (nonischemic cardiomyopathy), AKI (acute kidney injury), Chronic anticoagulation, HFrEF, Essential hypertension, ICD in place, Atrial fibrillation, Benign prostatic hyperplasia who was admitted to the hospital for SOB. Pharmacy was consulted for electrolyte replacement and monitoring.  Diuresis: Lasix 80 mg IV BID  Goal of Therapy:  Electrolytes within normal limits  Plan:  --K 3.6, will give Kcl 40 mEq PO x 1 dose given ongoing diuresis --Follow-up electrolytes with AM labs tomorrow  Benita Gutter 11/05/2021 8:01 AM

## 2021-11-05 NOTE — Consult Note (Signed)
CARDIOLOGY CONSULT NOTE               Patient ID: KRISTIN LAMAGNA MRN: [id number] DOB/AGE: [birth year] 79 y.o.  Admit date: 10/31/2021 Referring Physician Dr. Nelle Don Primary Physician Dr. Dion Body Primary Cardiologist Dr. 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  Interval history - Renal function continuing to improve with diuresis. Negative -1.4 L net yesterday.  - On 2 L O2. Feeling well today.   Review of systems complete and found to be negative unless listed above     Past Medical History:  Diagnosis Date   Acute urinary retention 03/05/2021   Anxiety    CHF (congestive heart failure) (HCC)    Claustrophobia    Colitis, ischemic ([place]) 2010   developed DVT   DVT (deep venous thrombosis) ([place]) 2010   stomach and leg   Dysrhythmia    A-Fib   History of 2019 novel coronavirus disease (COVID-19) 11/22/2019   Hypertension     Past Surgical History:  Procedure Laterality Date   HERNIA REPAIR Right 2007   IMPLANTABLE CARDIOVERTER DEFIBRILLATOR (ICD) GENERATOR CHANGE Left 02/21/2016   Procedure: ICD GENERATOR CHANGE;  Surgeon: Isaias Cowman, MD;  Location: [facility] ORS;  Service: Cardiovascular;  Laterality: Left;   PACEMAKER INSERTION     PITUITARY SURGERY  2002   Done at [facility]    Medications Prior to Admission  Medication Sig Dispense Refill Last Dose   amiodarone (PACERONE) 200 MG tablet Take 200 mg by mouth 2 (two) times daily.   Past Week   carvedilol (COREG) 3.125 MG tablet Take 3.125 mg by mouth 2 (two) times daily.    Past Week   cetirizine (ZYRTEC) 10 MG tablet Take 1 tablet by mouth daily.   Past Week   ENTRESTO 49-51 MG Take 1 tablet by mouth 2 (two) times daily.   Past Week   metolazone (ZAROXOLYN) 5 MG tablet Take 5 mg by mouth daily.   Past Week   potassium chloride SA (K-DUR,KLOR-CON) 20 MEQ tablet Take 20 mEq by mouth 2 (two) times daily.   Past Week   sertraline (ZOLOFT) 25 MG tablet Take 25 mg by mouth daily.   Past Week   vitamin B-12 (CYANOCOBALAMIN) 1000 MCG tablet Take 1,000 mcg by mouth daily.   Past Week   albuterol (VENTOLIN HFA) 108 (90 Base) MCG/ACT inhaler SMARTSIG:1-2 Inhalation Via Inhaler Every 4 Hours PRN   prn at unknown   ALPRAZolam (XANAX) 0.5 MG tablet Take 0.5 mg by mouth daily as needed for anxiety.    prn at unknown   CIALIS 5 MG tablet Take 5 mg by mouth daily.    prn at unknown   finasteride (PROSCAR) 5 MG tablet Take 1 tablet (5 mg total) by mouth daily. (Patient not taking: Reported on 11/02/2021) 90 tablet 3 Not Taking   fluticasone (FLONASE) 50 MCG/ACT nasal spray Place 2 sprays into both nostrils daily as needed for allergies or rhinitis.   prn at unknown   torsemide (DEMADEX) 20 MG tablet Take 20 mg by mouth 2 (two) times daily. (Patient not taking: Reported on 11/02/2021)   Not Taking  warfarin (COUMADIN) 1 MG tablet Take 2 mg by mouth See admin instructions. Take 2 MG by mouth every day   10/31/2021 at pm   Social History   Socioeconomic History   Marital status: Married    Spouse name: Not on file   Number of children: Not on file   Years of education: Not on file   Highest education level: Not on file  Occupational History   Not on file  Tobacco Use   Smoking status: Former    Types: Cigarettes    Quit date: 02/13/1974    Years since quitting: 47.7   Smokeless tobacco: Never  Substance and Sexual Activity   Alcohol use: No   Drug use: No   Sexual activity: Not on file  Other Topics Concern   Not on file  Social History Narrative   Not on file    Social Determinants of Health   Financial Resource Strain: Not on file  Food Insecurity: Not on file  Transportation Needs: Not on file  Physical Activity: Not on file  Stress: Not on file  Social Connections: Not on file  Intimate Partner Violence: Not on file    History reviewed. No pertinent family history.    Review of systems complete and found to be negative unless listed above    PHYSICAL EXAM General: Elderly and ill-appearing Caucasian male, in no acute distress.  Resting at angle against ICU bed wall with BiPAP in place  HEENT:  Normocephalic and atraumatic. Neck:  No JVD.  Lungs: Conversational dyspnea and tachypneic on BiPAP, O2 sat 100%.  Coarse breath sounds bilaterally to auscultation. Heart: Ventricularly paced at 80 bpm. Normal S1 and S2 without gallops or murmurs. Radial & DP pulses 2+ bilaterally.  Left chest with BiV AICD in place. Abdomen: distended appearing.  Msk: Normal strength and tone for age. Extremities: Edematous upper and lower extremities bilaterally. no clubbing, cyanosis. Neuro: Alert and oriented X 3. Psych:  Mood frustrated, affect congruent.   Labs:   Lab Results  Component Value Date   WBC 9.2 11/04/2021   HGB 13.1 11/04/2021   HCT 42.9 11/04/2021   MCV 94.3 11/04/2021   PLT 170 11/04/2021    Recent Labs  Lab 10/31/21 2008 11/01/21 0523 11/05/21 0445  NA 139   < > 143  K 3.2*   < > 3.6  CL 98   < > 98  CO2 31   < > 35*  BUN 56*   < > 58*  CREATININE 2.93*   < > 1.81*  CALCIUM 9.1   < > 8.8*  PROT 6.6  --   --   BILITOT 2.9*  --   --   ALKPHOS 92  --   --   ALT 52*  --   --   AST 57*  --   --   GLUCOSE 121*   < > 100*   < > = values in this interval not displayed.    Lab Results  Component Value Date   CKTOTAL 142 04/07/2012   CKMB 2.5 04/07/2012   TROPONINI 0.04 (H) 09/08/2015   No results found for: CHOL No results found for: HDL No results found for: LDLCALC No results found for: TRIG No results found for:  CHOLHDL No results found for: LDLDIRECT    Radiology: DG Chest 1 View  Result Date: 11/02/2021 CLINICAL DATA:  81 year old male with history of DVT. EXAM: CHEST  1 VIEW COMPARISON:  Chest x-ray 10/31/2021. FINDINGS: Lung  volumes are normal. There is cephalization of the pulmonary vasculature and slight indistinctness of the interstitial markings suggestive of mild pulmonary edema. Trace left pleural effusion. No definite right pleural effusion. No pneumothorax. Moderate to severe cardiomegaly. The patient is rotated to the right on today's exam, resulting in distortion of the mediastinal contours and reduced diagnostic sensitivity and specificity for mediastinal pathology. Atherosclerotic calcifications in the thoracic aorta. Left-sided biventricular pacemaker/AICD with lead tips projecting over the expected location of the right atrium, right ventricle and lateral wall the left ventricle via the coronary sinus and coronary veins. IMPRESSION: 1. The appearance the chest suggest congestive heart failure, as above. Electronically Signed   By: Vinnie Langton M.D.   On: 11/02/2021 07:22   DG Abd 1 View  Result Date: 10/31/2021 CLINICAL DATA:  Abdominal pain. EXAM: ABDOMEN - 1 VIEW COMPARISON:  October 27, 2010 FINDINGS: Mild atelectatic changes are seen within the bilateral lung bases. The cardiac silhouette is markedly enlarged. The bowel gas pattern is normal. Multiple small radiopaque surgical coils are seen overlying the lower pelvis on the right. No radio-opaque calculi or other significant radiographic abnormality are seen. IMPRESSION: 1. Normal bowel gas pattern without evidence of renal calculi. 2. Mild bibasilar atelectasis. Electronically Signed   By: Virgina Norfolk M.D.   On: 10/31/2021 23:39   US Venous Img Upper Uni Left (DVT)  Result Date: 11/01/2021 CLINICAL DATA:  Bruising of left forearm status post fall 1 week ago Edema Pain EXAM: LEFT UPPER EXTREMITY VENOUS DOPPLER ULTRASOUND  TECHNIQUE: Gray-scale sonography with graded compression, as well as color Doppler and duplex ultrasound were performed to evaluate the upper extremity deep venous system from the level of the subclavian vein and including the jugular, axillary, basilic, radial, ulnar and upper cephalic vein. Spectral Doppler was utilized to evaluate flow at rest and with distal augmentation maneuvers. COMPARISON:  None. FINDINGS: Contralateral Subclavian Vein: Respiratory phasicity is normal and symmetric with the symptomatic side. No evidence of thrombus. Normal compressibility. Internal Jugular Vein: No evidence of thrombus. Normal compressibility, respiratory phasicity and response to augmentation. Subclavian Vein: No evidence of thrombus. Normal compressibility, respiratory phasicity and response to augmentation. Axillary Vein: No evidence of thrombus. Normal compressibility, respiratory phasicity and response to augmentation. Cephalic Vein: No evidence of thrombus. Normal compressibility, respiratory phasicity and response to augmentation. 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 Brachial Veins: No evidence of thrombus. Normal compressibility, respiratory phasicity and response to augmentation. Radial Veins: No evidence of thrombus. Normal compressibility, respiratory phasicity and response to augmentation. Ulnar Veins: No evidence of thrombus. Normal compressibility, respiratory phasicity and response to augmentation. Venous Reflux:  None visualized. Other Findings:  None visualized. IMPRESSION: 1. No left lower extremity DVT. 2. Minimal partially occlusive web-like thrombus in the proximal left basilic vein is consistent with chronic superficial venous thrombosis. Electronically Signed   By: Miachel Roux M.D.   On: 11/01/2021 17:29   DG Chest Port 1 View  Result Date: 10/31/2021 CLINICAL DATA:  Questionable sepsis.  Evaluate for abnormality. EXAM: PORTABLE CHEST 1  VIEW COMPARISON:  03/05/2021 FINDINGS: Multi lead ICD is identified with battery pack in the left chest wall. Marked cardiac enlargement is unchanged. There is blunting of the left costophrenic angle, new from previous exam. This may represent a small effusion. Diffuse pulmonary vascular congestion. No frank edema or airspace consolidation. Platelike atelectasis is noted in the right lung base. IMPRESSION: 1. Cardiac enlargement, small left pleural effusion and  pulmonary vascular congestion. Correlate for any signs or symptoms of CHF. 2. Right base atelectasis. Electronically Signed   By: Kerby Moors M.D.   On: 10/31/2021 20:26   DG Shoulder Left Port  Result Date: 11/01/2021 CLINICAL DATA:  Left shoulder pain EXAM: LEFT SHOULDER COMPARISON:  None. FINDINGS: Mild AC joint degenerative change. Moderate glenohumeral degenerative change. No fracture or dislocation. Faint calcifications at the superolateral humeral head. IMPRESSION: 1. No acute osseous abnormality 2. Moderate degenerative changes with calcific tendinopathy. Electronically Signed   By: Donavan Foil M.D.   On: 11/01/2021 16:10   ECHOCARDIOGRAM COMPLETE  Result Date: 11/01/2021    ECHOCARDIOGRAM REPORT   Patient Name:   QUSAY VILLADA Date of Exam: 11/01/2021 Medical Rec #:  [id number]    Height:       68.0 in Accession #:    [id number]   Weight:       184.7 lb Date of Birth:  08/13/1941    BSA:          1.976 m Patient Age:    64 years     BP:           101/73 mmHg Patient Gender: M            HR:           78 bpm. Exam Location:  [facility] Procedure: 2D Echo, Cardiac Doppler and Color Doppler Indications:     CHF-acute diastolic P29.51                  CHF-acute systolic O84.16  History:         Patient has no prior history of Echocardiogram examinations.                  CHF; Risk Factors:Hypertension.  Sonographer:     Sherrie Sport Referring Phys:  [id number] [facility] Diagnosing Phys: Donnelly Angelica  Sonographer Comments: Suboptimal apical  window. IMPRESSIONS  1. Left ventricular ejection fraction, by estimation, is <20%. The left ventricle has severely decreased function. The left ventricle demonstrates regional wall motion abnormalities (see scoring diagram/findings for description). The left ventricular internal cavity size was mildly dilated. Left ventricular diastolic parameters are indeterminate. There is severe hypokinesis of the left ventricular, entire inferior wall and inferolateral wall.  2. Right ventricular systolic function reduced. The right ventricular size is not well visualized.  3. Left atrial size was severely dilated.  4. Right atrial size was severely dilated.  5. The mitral valve is grossly normal. Moderate mitral valve regurgitation.  6. The aortic valve is normal in structure. Aortic valve regurgitation is not visualized. Aortic valve sclerosis is present, with no evidence of aortic valve stenosis.  7. Mild pulmonic stenosis. FINDINGS  Left Ventricle: Left ventricular ejection fraction, by estimation, is <20%. The left ventricle has severely decreased function. The left ventricle demonstrates regional wall motion abnormalities. Severe hypokinesis of the left ventricular, entire inferior wall and inferolateral wall. The left ventricular internal cavity size was mildly dilated. There is no left ventricular hypertrophy. Left ventricular diastolic parameters are indeterminate. Right Ventricle: The right ventricular size is not well visualized. Right vetricular wall thickness was not well visualized. Right ventricular systolic function reduced. Left Atrium: Left atrial size was severely dilated. Right Atrium: Right atrial size was severely dilated. Pericardium: There is no evidence of pericardial effusion. Mitral Valve: The mitral valve is grossly normal. Moderate mitral valve regurgitation. Tricuspid Valve: The tricuspid valve is normal in structure. Tricuspid valve regurgitation  is not demonstrated. Aortic Valve: The aortic valve  is normal in structure. Aortic valve regurgitation is not visualized. Aortic valve sclerosis is present, with no evidence of aortic valve stenosis. Aortic valve mean gradient measures 2.0 mmHg. Aortic valve peak gradient measures 3.4 mmHg. Aortic valve area, by VTI measures 2.47 cm. Pulmonic Valve: The pulmonic valve was not well visualized. Pulmonic valve regurgitation is not visualized. Mild pulmonic stenosis. Aorta: The aortic root is normal in size and structure. Venous: The inferior vena cava was not well visualized. IAS/Shunts: The interatrial septum was not well visualized.  LEFT VENTRICLE PLAX 2D LVIDd:         6.20 cm LVIDs:         5.70 cm LV PW:         1.40 cm LV IVS:        0.95 cm LVOT diam:     2.10 cm LV SV:         32 LV SV Index:   16 LVOT Area:     3.46 cm  LV Volumes (MOD) LV vol d, MOD A4C: 328.0 ml LV vol s, MOD A4C: 219.0 ml LV SV MOD A4C:     328.0 ml RIGHT VENTRICLE RV Basal diam:  4.70 cm RV S prime:     9.68 cm/s TAPSE (M-mode): 4.2 cm LEFT ATRIUM              Index         RIGHT ATRIUM           Index LA diam:        8.30 cm  4.20 cm/m    RA Area:     50.50 cm LA Vol (A2C):   554.0 ml 280.37 ml/m  RA Volume:   230.00 ml 116.40 ml/m LA Vol (A4C):   339.0 ml 171.56 ml/m LA Biplane Vol: 439.0 ml 222.17 ml/m  AORTIC VALVE                    PULMONIC VALVE AV Area (Vmax):    2.39 cm     PV Vmax:        0.60 m/s AV Area (Vmean):   2.21 cm     PV Vmean:       37.450 cm/s AV Area (VTI):     2.47 cm     PV VTI:         0.081 m AV Vmax:           91.80 cm/s   PV Peak grad:   1.4 mmHg AV Vmean:          62.100 cm/s  PV Mean grad:   1.0 mmHg AV VTI:            0.130 m      RVOT Peak grad: 3 mmHg AV Peak Grad:      3.4 mmHg AV Mean Grad:      2.0 mmHg LVOT Vmax:         63.30 cm/s LVOT Vmean:        39.600 cm/s LVOT VTI:          0.093 m LVOT/AV VTI ratio: 0.71  AORTA Ao Root diam: 3.10 cm MITRAL VALVE                TRICUSPID VALVE MV Area (PHT): 4.93 cm     TR Peak grad:   16.8 mmHg MV  Decel Time: 154 msec  TR Vmax:        205.00 cm/s MV E velocity: 111.00 cm/s                             SHUNTS                             Systemic VTI:  0.09 m                             Systemic Diam: 2.10 cm                             Pulmonic VTI:  0.130 m Donnelly Angelica Electronically signed by Donnelly Angelica Signature Date/Time: 11/01/2021/12:52:46 PM    Final    [place] LT UPPER EXTREM LTD SOFT TISSUE NON VASCULAR  Result Date: 11/01/2021 CLINICAL DATA:  Left forearm bruise status post fall 1 week ago EXAM: ULTRASOUND LEFT UPPER EXTREMITY LIMITED TECHNIQUE: Ultrasound examination of the upper extremity soft tissues was performed in the area of clinical concern. COMPARISON:  None. FINDINGS: Targeted sonographic evaluation of the left forearm demonstrates a 1.5 x 0.6 x 1.0 cm complex fluid collection which is likely a hematoma. IMPRESSION: 1.5 x 0.6 x 1.0 cm complex fluid collection in the left forearm is likely a small hematoma. If patient's symptoms worsen, repeat ultrasound evaluation should be performed. Electronically Signed   By: Miachel Roux M.D.   On: 11/01/2021 17:30   CT HEAD CODE STROKE WO CONTRAST`  Result Date: 11/01/2021 CLINICAL DATA:  Code stroke.  Facial droop and slurred speech EXAM: CT HEAD WITHOUT CONTRAST TECHNIQUE: Contiguous axial images were obtained from the base of the skull through the vertex without intravenous contrast. COMPARISON:  01/07/2015 FINDINGS: Brain: No evidence of acute infarction, hemorrhage, hydrocephalus, extra-axial collection or mass lesion/mass effect. Vascular: No hyperdense vessel or unexpected calcification. Skull: Normal. Negative for fracture or focal lesion. Sinuses/Orbits: No acute finding. Other: These results were communicated to Dr Stark Klein at 6:03 am on 11/01/2021 by text page via the [facility] messaging system. ASPECTS [facility] Stroke Program Early CT Score) - Ganglionic level infarction (caudate, lentiform nuclei, internal capsule, insula, M1-M3 cortex): 7 -  Supraganglionic infarction (M4-M6 cortex): 3 Total score (0-10 with 10 being normal): 10 IMPRESSION: No acute finding.  No hemorrhage or visible infarct. Electronically Signed   By: Jorje Guild M.D.   On: 11/01/2021 06:05    ECHO 11/01/21 LVEF less than 20% with severely decreased function, severe hypokinesis of left ventricle, entire inferior wall and inferolateral wall.  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 - Increase IV Lasix 80 mg twice daily. Still volume overloaded. - Continue coreg 3.125 mg BID - Holding home Entresto, metolazone and torsemide. - resume losartan 12.5 mg in place  of Entresto today  #Acute hypoxic respiratory failure 2/2 suspected volume overload and possible pneumonia Antibiotics and respiratory support per primary team  #Permanent atrial fibrillation on warfarin for anticoagulation; s/p AV node ablation - Resume amiodarone 200 mg daily (prescribed BID, but unclear reason) - Warfarin supratherapeutic, appreciate pharmacy assistance with dosing  #AKI on CKD- Resolved Creatinine downtrending.  Baseline is probably around 2.  Continue to monitor, replete electrolytes as needed  #Hypertension #History of DVT As above   Signed: Andrez Grime , MD 11/05/2021, 9:54 AM

## 2021-11-05 NOTE — Progress Notes (Signed)
Nutrition Follow-up  DOCUMENTATION CODES:   Not applicable  INTERVENTION:   -Ensure Enlive po BID, each supplement provides 350 kcal and 20 grams of protein  -MVI with minerals daily  NUTRITION DIAGNOSIS:   Inadequate oral intake related to acute illness as evidenced by NPO status.  Progressing; advanced to PO diet on 11/03/21  GOAL:   Patient will meet greater than or equal to 90% of their needs  Progressing   MONITOR:   Labs, Weight trends, Skin, I & O's, Diet advancement  REASON FOR ASSESSMENT:   Malnutrition Screening Tool    ASSESSMENT:   81 y.o male  with significant PMH of DVT, ischemic colitis, NICM, CKD, chronic anticoagulation, COVID-19, CHF, NYHA class 2, essential hypertension, ICD, atrial fibrillation, benign prostatic hyperplasia with lower urinary tract symptoms who presented to the ED with chief complaints of progressive shortness of breath.  12/31- s/p BSE- advanced to dysphagia 1 diet with nectar thick liquids 1/2- advanced to dysphagia 3 diet with thin liquids  Reviewed I/O's: -1.4 L x 24 hours and -2.9 L since admission  UOP: 2.5 L x 24 hours  Case discussed with RN, who reports pt with good appetite and tolerating meals well. He did not like the pureed food, but consumed breakfast this morning after putting syrup on his food. RN confirmed diet advancement.  Spoke with pt, who was sitting in recliner char and watching Rocky movie at time of visit. He reports feel very well and having a good appetite. He consumed all of his breakfast this morning. Noted meal completions 100%. Pt is happy about his diet being advanced.   Discussed importance of good meal and supplement intake to promote healing. Pt amenable to Ensure; reports he consumes Isopure protein shakes at home, as he used to be a Research officer, political party.   Medications reviewed and include lasix.   Labs reviewed.   Diet Order:   Diet Order             DIET DYS 3 Room service  appropriate? Yes; Fluid consistency: Thin  Diet effective now                   EDUCATION NEEDS:   Education needs have been addressed  Skin:  Skin Assessment: Reviewed RN Assessment  Last BM:  11/04/21  Height:   Ht Readings from Last 1 Encounters:  11/01/21 5\' 8"  (1.727 m)    Weight:   Wt Readings from Last 1 Encounters:  11/05/21 82 kg    Ideal Body Weight:  70 kg  BMI:  Body mass index is 27.49 kg/m.  Estimated Nutritional Needs:   Kcal:  1900-2200kcal/day  Protein:  95-110g/day  Fluid:  1.8-2.1L/day    Loistine Chance, RD, LDN, [place] Registered Dietitian II Certified Diabetes Care and Education Specialist Please refer to AMION for RD and/or RD on-call/weekend/after hours pager

## 2021-11-05 NOTE — Progress Notes (Signed)
PROGRESS NOTE    Adrian Chavez  BHA:[id number] DOB: 10-Apr-1941 DOA: 10/31/2021 PCP: Dion Body, MD    Brief Narrative:  81 y.o. 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    In the ER, dyspneic, SpO2 70%, hypotensive.  Cr 2.93 from baseline 1.6, BNP >4500, pH 7.29, pCO2 67.  Placed on BiPAP, given empiric fluids and antibiotics and admitted to ICU.   12/28: Admitted to ICU on BiPAP 12/30: Remained on BiPAP, but improving, Cardiology consulted for diuresis 12/31: Stable for transfer to SDU, remains on IV Lasix, off antibiotics 1/1: Patient remains medically stable.  Continue to diurese.  Low suspicion for infection.  Antibiotics remain on hold   Assessment & Plan:   Principal Problem:   Acute on chronic respiratory failure with hypoxia and hypercapnia (HCC) Active Problems:   Benign prostatic hyperplasia with lower urinary tract symptoms   AF (paroxysmal atrial fibrillation) (HCC)   Acute on chronic combined systolic and diastolic CHF (congestive heart failure) (HCC)   Essential hypertension   NICM (nonischemic cardiomyopathy) (HCC)   Chronic anticoagulation   Acute renal failure (ARF) (HCC)   Pulmonary hypertension (HCC)   Moderate to severe mitral regurgitation   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HCC)   OSA (obstructive sleep apnea)  Acute on chronic heart failure with reduced ejection fraction, LVEF less than 20% Status post BiV ICD Cardiogenic shock, now resolved Nonischemic dilated cardiomyopathy Initially required vasopressor support, wean off as of 12/30 Remains volume overloaded Plan: Continue Lasix 80 mg IV twice daily Coreg 3.125 mg twice daily Currently holding home Entresto, metolazone, torsemide Losartan 12.5 mg daily started  by cardiology  Acute hypoxic respiratory failure secondary to volume overload Infectious process felt unlikely Patient without fevers, leukocytosis, clear source of infection Received 1 dose of Rocephin in ED Antibiotics on hold Monitor vitals and fever curve  Primary atrial fibrillation Chronic anticoagulation on warfarin Plan: Amiodarone 200 mg daily Warfarin pharmacy dosing assistance Telemetry monitoring  AKI on CKD stage IIIb Creatinine downtrending Avoid nonessential nephrotoxins Careful monitoring of electrolytes while on diuresis  Hypertension History of VTE Medication management as above  DVT prophylaxis: Warfarin Code Status: DNR Family Communication: None today Disposition Plan: Status is: Inpatient  Remains inpatient appropriate because: Cardiogenic shock, now resolved.  Acute decompensated heart failure.  Remains on IV diuretics.       Level of care: Telemetry Cardiac  Consultants:  Cardiology-[facility]  Procedures:  None  Antimicrobials: None   Subjective: Seen and examined.  Sleeping this morning.  Per bedside RN did not sleep at all last night  Objective: Vitals:   11/05/21 0700 11/05/21 0800 11/05/21 0832 11/05/21 1357  BP:  99/77  (!) 88/60  Pulse: 80 81 81 81  Resp: (!) 9 16 13  (!) 22  Temp:   97.6 F (36.4 C)   TempSrc:   Oral   SpO2: 96% 95% 96% 99%  Weight:      Height:        Intake/Output Summary (Last 24 hours) at 11/05/2021 1412 Last data filed at 11/05/2021 1400 Gross per 24 hour  Intake 1310 ml  Output 2785 ml  Net -1475 ml   Filed Weights   11/02/21 1400 11/04/21 0123 11/05/21 0213  Weight:  86.1 kg 82.9 kg 82 kg    Examination:  General exam: No acute distress Respiratory system: Bilateral scattered crackles.  Normal work of breathing.  2 L Cardiovascular system: S1-S2, RRR, no murmurs, 2+ pitting edema BLE Gastrointestinal system: Mild distended, nontender, positive bowel sounds Central nervous system:  Alert and oriented. No focal neurological deficits. Extremities: Symmetric 5 x 5 power. Skin: No rashes, lesions or ulcers Psychiatry: Judgement and insight appear normal. Mood & affect appropriate.     Data Reviewed: I have personally reviewed following labs and imaging studies  CBC: Recent Labs  Lab 10/31/21 2008 11/01/21 0523 11/03/21 0348 11/04/21 0400  WBC 3.9* 4.1 5.7 9.2  NEUTROABS 2.1  --   --   --   HGB 14.6 13.2 13.8 13.1  HCT 48.0 43.2 45.1 42.9  MCV 95.8 94.5 94.4 94.3  PLT 174 146* 142* 462   Basic Metabolic Panel: Recent Labs  Lab 11/01/21 0523 11/01/21 1818 11/02/21 0342 11/03/21 0348 11/04/21 0400 11/05/21 0445  NA 139 141 142 143 139 143  K 3.0* 3.0* 3.4* 4.0 4.0 3.6  CL 100 101 103 104 102 98  CO2 31 28 30 28 31  35*  GLUCOSE 84  --  78 123* 125* 100*  BUN 54*  --  46* 52* 66* 58*  CREATININE 2.68*  --  2.29* 2.21* 2.17* 1.81*  CALCIUM 8.7*  --  8.8* 8.8* 8.9 8.8*  MG 2.3  --  2.0 2.0 2.1 2.1  PHOS 4.9*  --  4.5 4.1 2.6 2.6   GFR: Estimated Creatinine Clearance: 32.5 mL/min (A) (by C-G formula based on SCr of 1.81 mg/dL (H)). Liver Function Tests: Recent Labs  Lab 10/31/21 2008  AST 57*  ALT 52*  ALKPHOS 92  BILITOT 2.9*  PROT 6.6  ALBUMIN 3.8   No results for input(s): LIPASE, AMYLASE in the last 168 hours. No results for input(s): AMMONIA in the last 168 hours. Coagulation Profile: Recent Labs  Lab 11/01/21 0523 11/02/21 0342 11/03/21 0348 11/04/21 0400 11/05/21 1009  INR 3.9* 3.5* 4.1* 4.3* 3.3*   Cardiac Enzymes: No results for input(s): CKTOTAL, CKMB, CKMBINDEX, TROPONINI in the last 168 hours. BNP (last 3 results) No results for input(s): PROBNP in the last 8760 hours. HbA1C: No results for input(s): HGBA1C in the last 72 hours. CBG: Recent Labs  Lab 10/31/21 2342 11/01/21 1726  GLUCAP 88 98   Lipid Profile: No results for input(s): CHOL, HDL, LDLCALC, TRIG, CHOLHDL, LDLDIRECT in the last 72 hours. Thyroid  Function Tests: No results for input(s): TSH, T4TOTAL, FREET4, T3FREE, THYROIDAB in the last 72 hours. Anemia Panel: No results for input(s): VITAMINB12, FOLATE, FERRITIN, TIBC, IRON, RETICCTPCT in the last 72 hours. Sepsis Labs: Recent Labs  Lab 10/31/21 2008 10/31/21 2028 10/31/21 2354  PROCALCITON  --  <0.10  --   LATICACIDVEN 1.7  --  1.6    Recent Results (from the past 240 hour(s))  Blood Culture (routine x 2)     Status: None (Preliminary result)   Collection Time: 10/31/21  8:08 PM   Specimen: BLOOD  Result Value Ref Range Status   Specimen Description BLOOD LEFT ANTECUBITAL  Final   Special Requests   Final    BOTTLES DRAWN AEROBIC AND ANAEROBIC Blood Culture adequate volume   Culture   Final    NO GROWTH 4 DAYS Performed at [facility], [address], [place], [place] [address]    Report Status PENDING  Incomplete  Blood Culture (routine  x 2)     Status: None (Preliminary result)   Collection Time: 10/31/21  8:13 PM   Specimen: BLOOD  Result Value Ref Range Status   Specimen Description BLOOD BLOOD LEFT FOREARM  Final   Special Requests   Final    BOTTLES DRAWN AEROBIC AND ANAEROBIC Blood Culture adequate volume   Culture   Final    NO GROWTH 4 DAYS Performed at [facility], [address], [place], [place] [address]    Report Status PENDING  Incomplete  Resp Panel by RT-PCR (Flu A&B, Covid) Nasopharyngeal Swab     Status: None   Collection Time: 10/31/21  8:28 PM   Specimen: Nasopharyngeal Swab; Nasopharyngeal(NP) swabs in vial transport medium  Result Value Ref Range Status   SARS Coronavirus 2 by RT PCR NEGATIVE NEGATIVE Final    Comment: (NOTE) SARS-CoV-2 target nucleic acids are NOT DETECTED.  The SARS-CoV-2 RNA is generally detectable in upper respiratory specimens during the acute phase of infection. The lowest concentration of SARS-CoV-2 viral copies this assay can detect is 138 copies/mL. A negative result does not preclude  SARS-Cov-2 infection and should not be used as the sole basis for treatment or other patient management decisions. A negative result may occur with  improper specimen collection/handling, submission of specimen other than nasopharyngeal swab, presence of viral mutation(s) within the areas targeted by this assay, and inadequate number of viral copies(<138 copies/mL). A negative result must be combined with clinical observations, patient history, and epidemiological information. The expected result is Negative.  Fact Sheet for Patients:  EntrepreneurPulse.com.au  Fact Sheet for Healthcare Providers:  IncredibleEmployment.be  This test is no t yet approved or cleared by the [place] FDA and  has been authorized for detection and/or diagnosis of SARS-CoV-2 by FDA under an Emergency Use Authorization (EUA). This EUA will remain  in effect (meaning this test can be used) for the duration of the COVID-19 declaration under Section 564(b)(1) of the Act, 21 U.S.C.section 360bbb-3(b)(1), unless the authorization is terminated  or revoked sooner.       Influenza A by PCR NEGATIVE NEGATIVE Final   Influenza B by PCR NEGATIVE NEGATIVE Final    Comment: (NOTE) The Xpert Xpress SARS-CoV-2/FLU/RSV plus assay is intended as an aid in the diagnosis of influenza from Nasopharyngeal swab specimens and should not be used as a sole basis for treatment. Nasal washings and aspirates are unacceptable for Xpert Xpress SARS-CoV-2/FLU/RSV testing.  Fact Sheet for Patients: EntrepreneurPulse.com.au  Fact Sheet for Healthcare Providers: IncredibleEmployment.be  This test is not yet approved or cleared by the [place] FDA and has been authorized for detection and/or diagnosis of SARS-CoV-2 by FDA under an Emergency Use Authorization (EUA). This EUA will remain in effect (meaning this test can be used) for the duration of  the COVID-19 declaration under Section 564(b)(1) of the Act, 21 U.S.C. section 360bbb-3(b)(1), unless the authorization is terminated or revoked.  Performed at [facility], [address] [place], [place] [address]   MRSA Next Gen by PCR, Nasal     Status: None   Collection Time: 10/31/21 11:59 PM   Specimen: Nasal Mucosa; Nasal Swab  Result Value Ref Range Status   MRSA by PCR Next Gen NOT DETECTED NOT DETECTED Final    Comment: (NOTE) The GeneXpert MRSA Assay (FDA approved for NASAL specimens only), is one component of a comprehensive MRSA colonization surveillance program. It is not intended to diagnose MRSA infection nor to guide or monitor treatment  for MRSA infections. Test performance is not FDA approved in patients less than 26 years old. Performed at [facility], [address] [place], [place] [address]   Urine Culture     Status: Abnormal   Collection Time: 11/01/21  1:00 AM   Specimen: Urine, Random  Result Value Ref Range Status   Specimen Description   Final    URINE, RANDOM Performed at [facility], [address] [place], [place] [address]    Special Requests   Final    NONE Performed at [facility], [place], [place] [address]    Culture (A)  Final    70,000 COLONIES/mL KLEBSIELLA OXYTOCA 50,000 COLONIES/mL ESCHERICHIA COLI    Report Status 11/04/2021 FINAL  Final   Organism ID, Bacteria KLEBSIELLA OXYTOCA (A)  Final   Organism ID, Bacteria ESCHERICHIA COLI (A)  Final      Susceptibility   Escherichia coli - MIC*    AMPICILLIN 4 SENSITIVE Sensitive     CEFAZOLIN <=4 SENSITIVE Sensitive     CEFEPIME <=0.12 SENSITIVE Sensitive     CEFTRIAXONE <=0.25 SENSITIVE Sensitive     CIPROFLOXACIN >=4 RESISTANT Resistant     GENTAMICIN <=1 SENSITIVE Sensitive     IMIPENEM <=0.25 SENSITIVE Sensitive     NITROFURANTOIN <=16 SENSITIVE Sensitive     TRIMETH/SULFA <=20 SENSITIVE Sensitive      AMPICILLIN/SULBACTAM <=2 SENSITIVE Sensitive     PIP/TAZO <=4 SENSITIVE Sensitive     * 50,000 COLONIES/mL ESCHERICHIA COLI   Klebsiella oxytoca - MIC*    AMPICILLIN >=32 RESISTANT Resistant     CEFAZOLIN 8 SENSITIVE Sensitive     CEFEPIME <=0.12 SENSITIVE Sensitive     CEFTRIAXONE <=0.25 SENSITIVE Sensitive     CIPROFLOXACIN <=0.25 SENSITIVE Sensitive     GENTAMICIN <=1 SENSITIVE Sensitive     IMIPENEM <=0.25 SENSITIVE Sensitive     NITROFURANTOIN 32 SENSITIVE Sensitive     TRIMETH/SULFA <=20 SENSITIVE Sensitive     AMPICILLIN/SULBACTAM 16 INTERMEDIATE Intermediate     PIP/TAZO <=4 SENSITIVE Sensitive     * 70,000 COLONIES/mL KLEBSIELLA OXYTOCA         Radiology Studies: No results found.      Scheduled Meds:  amiodarone  200 mg Oral Daily   budesonide (PULMICORT) nebulizer solution  0.25 mg Nebulization BID   carvedilol  3.125 mg Oral BID WC   Chlorhexidine Gluconate Cloth  6 each Topical Daily   feeding supplement  237 mL Oral BID BM   furosemide  80 mg Intravenous BID   losartan  12.5 mg Oral Daily   multivitamin with minerals  1 tablet Oral Daily   warfarin  2 mg Oral ONCE-1600   Warfarin - Pharmacist Dosing Inpatient   Does not apply q1600   Continuous Infusions:   LOS: 5 days    Time spent: 25 minutes    Sidney Ace, MD [facility]   If 7PM-7AM, please contact night-coverage  11/05/2021, 2:12 PM

## 2021-11-05 NOTE — Progress Notes (Addendum)
Speech Language Pathology Treatment:    Patient Details Name: Adrian Chavez MRN: [id number] DOB: Apr 04, 1941 Today's Date: 11/05/2021 Time: 0865-7846 SLP Time Calculation (min) (ACUTE ONLY): 35 min  Assessment / Plan / Recommendation Clinical Impression  Pt seen for diet tolerance/clinical swallowing evaluation. Cleared with RN. RN noted pt tolerating breakfast tray consisting of a pureed diet with nectar-thick liquids without overt s/sx pharyngeal dysphagia. Pt alert and cooperative. Seated upright in chair. Pt eager to upgrade diet.   Per chart review, WBC and temp WNL . Pt on 2L/min O2 via .   Given reported tolerance of current diet by pt and RN. Pt given trials of thin liquids (via cup and straw; total of 6 oz) and x2 saltine crackers (presented in halves). Pt with s/sx mild oral dysphagia c/b prolonged mastication of solids (likely due to dental status; wears upper partial; not present) and trace oral residual which cleared with unprompted liquid wash. No overt s/sx pharyngeal dysphagia noted across trials. To palpation, seemingly timely swallow initiation and seemingly adequate laryngeal elevation. No change to vocal quality noted across trials. Respiratory rate remained stable ~12-15 breaths per minute during evaluation.  Recommend diet upgrade to mech soft diet with thin liquids and safe swallowing strategies/aspiration precautions as outlined below.   SLP to f/u x1 for diet tolerance as pt with increased risk for aspiration/aspiration PNA given multiple medical comorbidites and respiratory status.   Pt educated at length re: diet recommendations, safe swallowing strategies/aspiration precautions, relationship between breathing/swallowing, and SLP POC. Pt verbalized understanding/agreement. RN also updated.    HPI HPI: Per 64 H&P "81 y.o male  with significant PMH of DVT, Ischemic Colitis, NICM (nonischemic cardiomyopathy), AKI (acute kidney injury), Chronic anticoagulation,  History of 2019 novel coronavirus disease ([id number]), Chronic systolic CHF (congestive heart failure), NYHA class 2, Essential hypertension, ICD (implantable cardioverter-defibrillator) in place, Atrial fibrillation, Benign prostatic hyperplasia with lower urinary tract symptoms who presented to the ED with chief complaints of progressive shortness of breath.     Per family members at the bedside, EMS was called for complaints of progressive SOB. The family members reported that the patient has been more weak for a week due to nausea, vomiting, and diarrhea. No reports of fever, chills, chest pain, abdominal pain, or cough. On EMS arrival, patient was already on NRB, he was hypotensive, but alert and oriented. After IV access was obtained, responders began administering fluid and placing the patient on the stair chair. Enroute the patient  received IV fluids. The patient remained alert and oriented, but became lethargic upon arrival to the hospital." CXR on admission "1. Cardiac enlargement, small left pleural effusion and pulmonary  vascular congestion. Correlate for any signs or symptoms of CHF.  2. Right base atelectasis." Head CT on admission "No acute finding.  No hemorrhage or visible infarct."      SLP Plan  Continue with current plan of care      Recommendations for follow up therapy are one component of a multi-disciplinary discharge planning process, led by the attending physician.  Recommendations may be updated based on patient status, additional functional criteria and insurance authorization.    Recommendations  Diet recommendations: Dysphagia 3 (mechanical soft);Thin liquid Liquids provided via: Cup;Straw Supervision: Patient able to self feed;Intermittent supervision to cue for compensatory strategies (review safe swallowing strategies with pt prior to POs) Compensations: Minimize environmental distractions;Slow rate;Small sips/bites;Follow solids with liquid Postural Changes and/or  Swallow Maneuvers: Out of bed for meals;Seated upright 90 degrees;Upright 30-60 min after  meal               Oral Care Recommendations: Oral care QID Follow Up Recommendations:  (TBD) Assistance recommended at discharge:  (TBD) SLP Visit Diagnosis: Dysphagia, pharyngeal phase (R13.13) Plan: Continue with current plan of care         Cherrie Gauze, M.S., [facility] [phone] [place])   Quintella Baton  11/05/2021, 11:36 AM

## 2021-11-05 NOTE — TOC Transition Note (Addendum)
Transition of Care [facility]) - CM/SW Discharge Note   Patient Details  Name: LEANDREW KEECH MRN: [id number] Date of Birth: Mar 06, 1941  Transition of [facility] CM/SW Contact:  Shelbie Hutching, RN Phone Number: 11/05/2021, 5:53 PM   Clinical Narrative:    RNCM met with patient at the bedside.  Patient sitting up eating lunch.  PT is recommending home health.  Patient agrees to having home health set up.  RNCM gave referral to [place] with [facility] she will look to see if they can accept for RN, PT, and OT.  Patient will also need a RW at discharge.       Barriers to Discharge: Continued Medical Work up   Patient Goals and CMS Choice Patient states their goals for this hospitalization and ongoing recovery are:: Patient on Bipap- son thinks he needs oxygen at home CMS Medicare.gov Compare Post Acute Care list provided to:: Patient Choice offered to / list presented to : Patient  Discharge Placement                       Discharge Plan and Services   Discharge Planning Services: CM Consult Post Acute Care Choice: Home Health          DME Arranged: [place] rolling DME Agency: [facility]       HH Arranged: RN, PT, OT HH Agency: [facility] Date [place]: 11/05/21 Time [place]: 8032 Representative spoke with at [place]: [place]  Social Determinants of Health ([place]) Interventions     Readmission Risk Interventions No flowsheet data found.

## 2021-11-05 NOTE — Consult Note (Signed)
Adrian Chavez for Warfarin  Indication: atrial fibrillation and LV thrombus  Patient Measurements: Height: 5\' 8"  (172.7 cm) Weight: 82 kg (180 lb 12.4 oz) IBW/kg (Calculated) : 68.4  Labs: Recent Labs    11/03/21 0348 11/04/21 0400 11/05/21 0445 11/05/21 1009  HGB 13.8 13.1  --   --   HCT 45.1 42.9  --   --   PLT 142* 170  --   --   LABPROT 39.4* 41.0*  --  33.5*  INR 4.1* 4.3*  --  3.3*  CREATININE 2.21* 2.17* 1.81*  --      Estimated Creatinine Clearance: 32.5 mL/min (A) (by C-G formula based on SCr of 1.81 mg/dL (H)).   Medical History: Past Medical History:  Diagnosis Date   Acute urinary retention 03/05/2021   Anxiety    CHF (congestive heart failure) ([place])    Claustrophobia    Colitis, ischemic ([place]) 2010   developed DVT   DVT (deep venous thrombosis) ([place]) 2010   stomach and leg   Dysrhythmia    A-Fib   History of 2019 novel coronavirus disease (COVID-19) 11/22/2019   Hypertension      Assessment: 81yo male with PMH of DVT, Ischemic Colitis, NICM (nonischemic cardiomyopathy), AKI (acute kidney injury), Chronic anticoagulation, Chronic systolic CHF, NYHA class 2, Essential hypertension, ICD in place, Atrial fibrillation, Benign prostatic hyperplasia who was admitted to the hospital for SOB. Pharmacy was consulted for warfarin dosing and monitoring.  PTA regimen: 2mg  daily (TWD=14 mg) (confirmed with PCP)  Last dose: 12/27  DDI: amiodarone (home medication)  Date/time INR Warfarin Dose 12/28   3.9 HOLD 12/29   3.9 HOLD 12/30   3.5 HOLD 12/31   4.1 HOLD 1/1  4.3 HOLD 1/2  3.3 Warfarin 2 mg  Goal of Therapy:  INR 2-3 Monitor platelets by anticoagulation protocol: Yes   Plan:  --INR supratherapeutic again today but down-trending. Suspect INR elevations primarily driven by hepatic congestion / poor PO intake / antibiotics (which have been discontinued) --Patient has missed x 5 days of warfarin therapy. Will go ahead  and resume home dose of warfarin 2 mg tonight --Daily INR per protocol --CBC at least every 3 days per protocol; stable  Benita Gutter 11/05/2021 12:17 PM

## 2021-11-06 LAB — BASIC METABOLIC PANEL
Anion gap: 7 (ref 5–15)
BUN: 59 mg/dL — ABNORMAL HIGH (ref 8–23)
CO2: 35 mmol/L — ABNORMAL HIGH (ref 22–32)
Calcium: 8.7 mg/dL — ABNORMAL LOW (ref 8.9–10.3)
Chloride: 98 mmol/L (ref 98–111)
Creatinine, Ser: 1.81 mg/dL — ABNORMAL HIGH (ref 0.61–1.24)
GFR, Estimated: 38 mL/min — ABNORMAL LOW (ref >60)
Glucose, Bld: 102 mg/dL — ABNORMAL HIGH (ref 70–99)
Potassium: 3.8 mmol/L (ref 3.5–5.1)
Sodium: 140 mmol/L (ref 135–145)

## 2021-11-06 LAB — PROTIME-INR
INR: 2 — ABNORMAL HIGH (ref 0.8–1.2)
Prothrombin Time: 22.9 seconds — ABNORMAL HIGH (ref 11.4–15.2)

## 2021-11-06 LAB — CULTURE, BLOOD (ROUTINE X 2)
Culture: NO GROWTH
Culture: NO GROWTH
Special Requests: ADEQUATE
Special Requests: ADEQUATE

## 2021-11-06 LAB — MAGNESIUM: Magnesium: 2.1 mg/dL (ref 1.7–2.4)

## 2021-11-06 MED ORDER — ENSURE ENLIVE PO LIQD
237.0000 mL | Freq: Three times a day (TID) | ORAL | Status: DC
  Administered 2021-11-06 – 2021-11-08 (×5): 237 mL via ORAL

## 2021-11-06 MED ORDER — POTASSIUM CHLORIDE CRYS ER 20 MEQ PO TBCR
20.0000 meq | EXTENDED_RELEASE_TABLET | Freq: Two times a day (BID) | ORAL | Status: DC
  Administered 2021-11-06 – 2021-11-08 (×5): 20 meq via ORAL
  Filled 2021-11-06 (×4): qty 1

## 2021-11-06 MED ORDER — WARFARIN SODIUM 2 MG PO TABS
2.0000 mg | ORAL_TABLET | Freq: Every day | ORAL | Status: DC
  Administered 2021-11-06 – 2021-11-07 (×2): 2 mg via ORAL
  Filled 2021-11-06 (×2): qty 1

## 2021-11-06 MED ORDER — POTASSIUM CHLORIDE CRYS ER 20 MEQ PO TBCR
40.0000 meq | EXTENDED_RELEASE_TABLET | Freq: Once | ORAL | Status: DC
  Filled 2021-11-06: qty 2

## 2021-11-06 NOTE — Progress Notes (Signed)
PROGRESS NOTE    Adrian Chavez  SEG:[id number] DOB: 04/26/1941 DOA: 10/31/2021 PCP: Dion Body, MD    Brief Narrative:  81 y.o. 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    In the ER, dyspneic, SpO2 70%, hypotensive.  Cr 2.93 from baseline 1.6, BNP >4500, pH 7.29, pCO2 67.  Placed on BiPAP, given empiric fluids and antibiotics and admitted to ICU.   12/28: Admitted to ICU on BiPAP 12/30: Remained on BiPAP, but improving, Cardiology consulted for diuresis 12/31: Stable for transfer to SDU, remains on IV Lasix, off antibiotics 1/1: Patient remains medically stable.  Continue to diurese.  Low suspicion for infection.  Antibiotics remain on hold   Assessment & Plan:   Principal Problem:   Acute on chronic respiratory failure with hypoxia and hypercapnia (HCC) Active Problems:   Benign prostatic hyperplasia with lower urinary tract symptoms   AF (paroxysmal atrial fibrillation) (HCC)   Acute on chronic combined systolic and diastolic CHF (congestive heart failure) (HCC)   Essential hypertension   NICM (nonischemic cardiomyopathy) (HCC)   Chronic anticoagulation   Acute renal failure (ARF) (HCC)   Pulmonary hypertension (HCC)   Moderate to severe mitral regurgitation   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HCC)   OSA (obstructive sleep apnea)  Acute on chronic heart failure with reduced ejection fraction, LVEF less than 20% Status post BiV ICD Cardiogenic shock, now resolved Nonischemic dilated cardiomyopathy Initially required vasopressor support, wean off as of 12/30 Remains volume overloaded, slowly improving Plan: Continue Lasix 80 mg IV twice daily Coreg 3.125 mg twice daily Currently holding home Entresto, metolazone, torsemide Losartan 12.5  mg daily started by cardiology, could not be started due to low blood pressures.  Okay to hold to allow more room for diuresis  Acute hypoxic respiratory failure secondary to volume overload Infectious process felt unlikely Patient without fevers, leukocytosis, clear source of infection Received 1 dose of Rocephin in ED No indication for antibiotics at this time Monitor vitals and fever curve  Primary atrial fibrillation Chronic anticoagulation on warfarin Plan: Amiodarone 200 mg daily Warfarin pharmacy dosing assistance Telemetry monitoring  AKI on CKD stage IIIb Creatinine downtrending Avoid nonessential nephrotoxins Careful monitoring of electrolytes while on diuresis  Hypertension History of VTE Medication management as above  DVT prophylaxis: Warfarin Code Status: DNR Family Communication: None today Disposition Plan: Status is: Inpatient  Remains inpatient appropriate because: Cardiogenic shock, now resolved.  Acute decompensated heart failure.  Remains on IV diuretics.  Plan to transition to p.o. diuretics within the next 24 hours.  Will attempt to transition to progressive unit but blood pressure prohibitive at this time       Level of care: Stepdown  Consultants:  Cardiology-[facility]  Procedures:  None  Antimicrobials: None   Subjective:  patient seen and examined.  More awake this morning.  Sitting up in chair.  Answers all questions appropriately.  Objective: Vitals:   11/06/21 0754 11/06/21 0800 11/06/21 0843 11/06/21 1000  BP:  (!) 73/58 91/72 91/69   Pulse:  71 79 80  Resp:  (!) 23 18 20   Temp: 98 F (36.7 C)     TempSrc: Oral     SpO2:  100% 95% 97%  Weight:  Height:        Intake/Output Summary (Last 24 hours) at 11/06/2021 1403 Last data filed at 11/06/2021 1100 Gross per 24 hour  Intake 1014 ml  Output 1601 ml  Net -587 ml   Filed Weights   11/04/21 0123 11/05/21 0213 11/06/21 0500  Weight: 82.9 kg 82 kg 82.9 kg     Examination:  General exam: No acute distress.  Appears fatigued Respiratory system: Bibasilar crackles.  Normal work of breathing.  2 L Cardiovascular system: S1-S2, RRR, no murmurs, 2+ pitting edema BLE Gastrointestinal system: Mild distended, nontender, positive bowel sounds Central nervous system: Alert and oriented. No focal neurological deficits. Extremities: Symmetric 5 x 5 power. Skin: No rashes, lesions or ulcers Psychiatry: Judgement and insight appear normal. Mood & affect appropriate.     Data Reviewed: I have personally reviewed following labs and imaging studies  CBC: Recent Labs  Lab 10/31/21 2008 11/01/21 0523 11/03/21 0348 11/04/21 0400  WBC 3.9* 4.1 5.7 9.2  NEUTROABS 2.1  --   --   --   HGB 14.6 13.2 13.8 13.1  HCT 48.0 43.2 45.1 42.9  MCV 95.8 94.5 94.4 94.3  PLT 174 146* 142* 540   Basic Metabolic Panel: Recent Labs  Lab 11/01/21 0523 11/01/21 1818 11/02/21 0342 11/03/21 0348 11/04/21 0400 11/05/21 0445 11/06/21 0501  NA 139   < > 142 143 139 143 140  K 3.0*   < > 3.4* 4.0 4.0 3.6 3.8  CL 100   < > 103 104 102 98 98  CO2 31   < > 30 28 31  35* 35*  GLUCOSE 84  --  78 123* 125* 100* 102*  BUN 54*  --  46* 52* 66* 58* 59*  CREATININE 2.68*  --  2.29* 2.21* 2.17* 1.81* 1.81*  CALCIUM 8.7*  --  8.8* 8.8* 8.9 8.8* 8.7*  MG 2.3  --  2.0 2.0 2.1 2.1 2.1  PHOS 4.9*  --  4.5 4.1 2.6 2.6  --    < > = values in this interval not displayed.   GFR: Estimated Creatinine Clearance: 35.3 mL/min (A) (by C-G formula based on SCr of 1.81 mg/dL (H)). Liver Function Tests: Recent Labs  Lab 10/31/21 2008  AST 57*  ALT 52*  ALKPHOS 92  BILITOT 2.9*  PROT 6.6  ALBUMIN 3.8   No results for input(s): LIPASE, AMYLASE in the last 168 hours. No results for input(s): AMMONIA in the last 168 hours. Coagulation Profile: Recent Labs  Lab 11/02/21 0342 11/03/21 0348 11/04/21 0400 11/05/21 1009 11/06/21 0501  INR 3.5* 4.1* 4.3* 3.3* 2.0*   Cardiac  Enzymes: No results for input(s): CKTOTAL, CKMB, CKMBINDEX, TROPONINI in the last 168 hours. BNP (last 3 results) No results for input(s): PROBNP in the last 8760 hours. HbA1C: No results for input(s): HGBA1C in the last 72 hours. CBG: Recent Labs  Lab 10/31/21 2342 11/01/21 1726  GLUCAP 88 98   Lipid Profile: No results for input(s): CHOL, HDL, LDLCALC, TRIG, CHOLHDL, LDLDIRECT in the last 72 hours. Thyroid Function Tests: No results for input(s): TSH, T4TOTAL, FREET4, T3FREE, THYROIDAB in the last 72 hours. Anemia Panel: No results for input(s): VITAMINB12, FOLATE, FERRITIN, TIBC, IRON, RETICCTPCT in the last 72 hours. Sepsis Labs: Recent Labs  Lab 10/31/21 2008 10/31/21 2028 10/31/21 2354  PROCALCITON  --  <0.10  --   LATICACIDVEN 1.7  --  1.6    Recent Results (from the past 240 hour(s))  Blood Culture (routine x 2)  Status: None (Preliminary result)   Collection Time: 10/31/21  8:08 PM   Specimen: BLOOD  Result Value Ref Range Status   Specimen Description BLOOD LEFT ANTECUBITAL  Final   Special Requests   Final    BOTTLES DRAWN AEROBIC AND ANAEROBIC Blood Culture adequate volume   Culture   Final    NO GROWTH 4 DAYS Performed at [facility], [address] [place], [place] [address]    Report Status PENDING  Incomplete  Blood Culture (routine x 2)     Status: None (Preliminary result)   Collection Time: 10/31/21  8:13 PM   Specimen: BLOOD  Result Value Ref Range Status   Specimen Description BLOOD BLOOD LEFT FOREARM  Final   Special Requests   Final    BOTTLES DRAWN AEROBIC AND ANAEROBIC Blood Culture adequate volume   Culture   Final    NO GROWTH 4 DAYS Performed at [facility], [address] [place], [place] [address]    Report Status PENDING  Incomplete  Resp Panel by RT-PCR (Flu A&B, Covid) Nasopharyngeal Swab     Status: None   Collection Time: 10/31/21  8:28 PM   Specimen: Nasopharyngeal Swab; Nasopharyngeal(NP) swabs in  vial transport medium  Result Value Ref Range Status   SARS Coronavirus 2 by RT PCR NEGATIVE NEGATIVE Final    Comment: (NOTE) SARS-CoV-2 target nucleic acids are NOT DETECTED.  The SARS-CoV-2 RNA is generally detectable in upper respiratory specimens during the acute phase of infection. The lowest concentration of SARS-CoV-2 viral copies this assay can detect is 138 copies/mL. A negative result does not preclude SARS-Cov-2 infection and should not be used as the sole basis for treatment or other patient management decisions. A negative result may occur with  improper specimen collection/handling, submission of specimen other than nasopharyngeal swab, presence of viral mutation(s) within the areas targeted by this assay, and inadequate number of viral copies(<138 copies/mL). A negative result must be combined with clinical observations, patient history, and epidemiological information. The expected result is Negative.  Fact Sheet for Patients:  EntrepreneurPulse.com.au  Fact Sheet for Healthcare Providers:  IncredibleEmployment.be  This test is no t yet approved or cleared by the [place] FDA and  has been authorized for detection and/or diagnosis of SARS-CoV-2 by FDA under an Emergency Use Authorization (EUA). This EUA will remain  in effect (meaning this test can be used) for the duration of the COVID-19 declaration under Section 564(b)(1) of the Act, 21 U.S.C.section 360bbb-3(b)(1), unless the authorization is terminated  or revoked sooner.       Influenza A by PCR NEGATIVE NEGATIVE Final   Influenza B by PCR NEGATIVE NEGATIVE Final    Comment: (NOTE) The Xpert Xpress SARS-CoV-2/FLU/RSV plus assay is intended as an aid in the diagnosis of influenza from Nasopharyngeal swab specimens and should not be used as a sole basis for treatment. Nasal washings and aspirates are unacceptable for Xpert Xpress SARS-CoV-2/FLU/RSV testing.  Fact  Sheet for Patients: EntrepreneurPulse.com.au  Fact Sheet for Healthcare Providers: IncredibleEmployment.be  This test is not yet approved or cleared by the [place] FDA and has been authorized for detection and/or diagnosis of SARS-CoV-2 by FDA under an Emergency Use Authorization (EUA). This EUA will remain in effect (meaning this test can be used) for the duration of the COVID-19 declaration under Section 564(b)(1) of the Act, 21 U.S.C. section 360bbb-3(b)(1), unless the authorization is terminated or revoked.  Performed at [facility], [address] [place],  [place] [address]   MRSA Next Gen by PCR, Nasal     Status: None   Collection Time: 10/31/21 11:59 PM   Specimen: Nasal Mucosa; Nasal Swab  Result Value Ref Range Status   MRSA by PCR Next Gen NOT DETECTED NOT DETECTED Final    Comment: (NOTE) The GeneXpert MRSA Assay (FDA approved for NASAL specimens only), is one component of a comprehensive MRSA colonization surveillance program. It is not intended to diagnose MRSA infection nor to guide or monitor treatment for MRSA infections. Test performance is not FDA approved in patients less than 71 years old. Performed at [facility], [address] [place], [place] [address]   Urine Culture     Status: Abnormal   Collection Time: 11/01/21  1:00 AM   Specimen: Urine, Random  Result Value Ref Range Status   Specimen Description   Final    URINE, RANDOM Performed at [facility], [address] [place], [place] [address]    Special Requests   Final    NONE Performed at [facility], [place], [place] [address]    Culture (A)  Final    70,000 COLONIES/mL KLEBSIELLA OXYTOCA 50,000 COLONIES/mL ESCHERICHIA COLI    Report Status 11/04/2021 FINAL  Final   Organism ID, Bacteria KLEBSIELLA OXYTOCA (A)  Final   Organism ID, Bacteria ESCHERICHIA COLI (A)  Final       Susceptibility   Escherichia coli - MIC*    AMPICILLIN 4 SENSITIVE Sensitive     CEFAZOLIN <=4 SENSITIVE Sensitive     CEFEPIME <=0.12 SENSITIVE Sensitive     CEFTRIAXONE <=0.25 SENSITIVE Sensitive     CIPROFLOXACIN >=4 RESISTANT Resistant     GENTAMICIN <=1 SENSITIVE Sensitive     IMIPENEM <=0.25 SENSITIVE Sensitive     NITROFURANTOIN <=16 SENSITIVE Sensitive     TRIMETH/SULFA <=20 SENSITIVE Sensitive     AMPICILLIN/SULBACTAM <=2 SENSITIVE Sensitive     PIP/TAZO <=4 SENSITIVE Sensitive     * 50,000 COLONIES/mL ESCHERICHIA COLI   Klebsiella oxytoca - MIC*    AMPICILLIN >=32 RESISTANT Resistant     CEFAZOLIN 8 SENSITIVE Sensitive     CEFEPIME <=0.12 SENSITIVE Sensitive     CEFTRIAXONE <=0.25 SENSITIVE Sensitive     CIPROFLOXACIN <=0.25 SENSITIVE Sensitive     GENTAMICIN <=1 SENSITIVE Sensitive     IMIPENEM <=0.25 SENSITIVE Sensitive     NITROFURANTOIN 32 SENSITIVE Sensitive     TRIMETH/SULFA <=20 SENSITIVE Sensitive     AMPICILLIN/SULBACTAM 16 INTERMEDIATE Intermediate     PIP/TAZO <=4 SENSITIVE Sensitive     * 70,000 COLONIES/mL KLEBSIELLA OXYTOCA         Radiology Studies: No results found.      Scheduled Meds:  amiodarone  200 mg Oral Daily   budesonide (PULMICORT) nebulizer solution  0.25 mg Nebulization BID   carvedilol  3.125 mg Oral BID WC   Chlorhexidine Gluconate Cloth  6 each Topical Daily   feeding supplement  237 mL Oral TID BM   furosemide  80 mg Intravenous BID   losartan  12.5 mg Oral Daily   multivitamin with minerals  1 tablet Oral Daily   potassium chloride  20 mEq Oral BID   warfarin  2 mg Oral q1600   Warfarin - Pharmacist Dosing Inpatient   Does not apply q1600   Continuous Infusions:   LOS: 6 days    Time spent: 25 minutes    Sidney Ace, MD [facility]  If 7PM-7AM, please contact night-coverage  11/06/2021, 2:03 PM

## 2021-11-06 NOTE — Consult Note (Signed)
[facility] for Warfarin  Indication: atrial fibrillation and LV thrombus  Patient Measurements: Height: 5\' 8"  (172.7 cm) Weight: 82.9 kg (182 lb 12.2 oz) IBW/kg (Calculated) : 68.4  Labs: Recent Labs    11/04/21 0400 11/05/21 0445 11/05/21 1009 11/06/21 0501  HGB 13.1  --   --   --   HCT 42.9  --   --   --   PLT 170  --   --   --   LABPROT 41.0*  --  33.5* 22.9*  INR 4.3*  --  3.3* 2.0*  CREATININE 2.17* 1.81*  --  1.81*     Estimated Creatinine Clearance: 35.3 mL/min (A) (by C-G formula based on SCr of 1.81 mg/dL (H)).   Medical History: Past Medical History:  Diagnosis Date   Acute urinary retention 03/05/2021   Anxiety    CHF (congestive heart failure) ([place])    Claustrophobia    Colitis, ischemic ([place]) 2010   developed DVT   DVT (deep venous thrombosis) ([place]) 2010   stomach and leg   Dysrhythmia    A-Fib   History of 2019 novel coronavirus disease (COVID-19) 11/22/2019   Hypertension      Assessment: 81yo male with PMH of DVT, Ischemic Colitis, NICM (nonischemic cardiomyopathy), AKI (acute kidney injury), Chronic anticoagulation, Chronic systolic CHF, NYHA class 2, Essential hypertension, ICD in place, Atrial fibrillation, Benign prostatic hyperplasia who was admitted to the hospital for SOB. Pharmacy was consulted for warfarin dosing and monitoring.  PTA regimen: 2mg  daily (TWD=14 mg) (confirmed with PCP)  Last dose: 12/27  DDI: amiodarone (home medication)  Date/time INR Warfarin Dose 12/28   3.9 HOLD 12/29   3.9 HOLD 12/30   3.5 HOLD 12/31   4.1 HOLD 1/1  4.3 HOLD 1/2  3.3 Warfarin 2 mg 1/3  2.0 Warfarin 2 mg  Goal of Therapy:  INR 2-3 Monitor platelets by anticoagulation protocol: Yes   Plan:  --INR at lower limit of therapeutic. Anticipate several days for warfarin effect given missed doses --Re-start home regimen of warfarin 2 mg daily --Daily INR per protocol --CBC at least every 3 days per protocol;  stable  Benita Gutter 11/06/2021 7:48 AM

## 2021-11-06 NOTE — Progress Notes (Signed)
PT Cancellation Note  Patient Details Name: BIRT REINOSO MRN: [id number] DOB: 26-Jan-1941   Cancelled Treatment:    Reason Eval/Treat Not Completed: Other (comment). Attempted to work with pt this morning, however he recently placed bipap and was asleep from not sleeping all night. Per RN, requesting to attempt therapy another time when patient awake. Of note, pt is continuing to ambulate in hallway with RN staff without assistance. Will continue to progress as able.   Emoree Sasaki 11/06/2021, 12:06 PM Greggory Stallion, PT, DPT [phone]

## 2021-11-06 NOTE — Progress Notes (Signed)
[facility] Cardiology  SUBJECTIVE: Patient sitting in recliner, reports doing well, denies chest pain or shortness of breath   Vitals:   11/06/21 0400 11/06/21 0500 11/06/21 0737 11/06/21 0800  BP: 94/72     Pulse: 80     Resp: 19     Temp: 98.5 F (36.9 C)  98 F (36.7 C)   TempSrc: Oral  Oral   SpO2: 96%   100%  Weight:  82.9 kg    Height:         Intake/Output Summary (Last 24 hours) at 11/06/2021 0830 Last data filed at 11/06/2021 0745 Gross per 24 hour  Intake 1264 ml  Output 1990 ml  Net -726 ml      PHYSICAL EXAM  General: Well developed, well nourished, in no acute distress HEENT:  Normocephalic and atramatic Neck:  No JVD.  Lungs: Clear bilaterally to auscultation and percussion. Heart: HRRR . Normal S1 and S2 without gallops or murmurs.  Abdomen: Bowel sounds are positive, abdomen soft and non-tender  Msk:  Back normal, normal gait. Normal strength and tone for age. Extremities: No clubbing, cyanosis or edema.   Neuro: Alert and oriented X 3. Psych:  Good affect, responds appropriately   LABS: Basic Metabolic Panel: Recent Labs    11/04/21 0400 11/05/21 0445 11/06/21 0501  NA 139 143 140  K 4.0 3.6 3.8  CL 102 98 98  CO2 31 35* 35*  GLUCOSE 125* 100* 102*  BUN 66* 58* 59*  CREATININE 2.17* 1.81* 1.81*  CALCIUM 8.9 8.8* 8.7*  MG 2.1 2.1 2.1  PHOS 2.6 2.6  --    Liver Function Tests: No results for input(s): AST, ALT, ALKPHOS, BILITOT, PROT, ALBUMIN in the last 72 hours. No results for input(s): LIPASE, AMYLASE in the last 72 hours. CBC: Recent Labs    11/04/21 0400  WBC 9.2  HGB 13.1  HCT 42.9  MCV 94.3  PLT 170   Cardiac Enzymes: No results for input(s): CKTOTAL, CKMB, CKMBINDEX, TROPONINI in the last 72 hours. BNP: Invalid input(s): POCBNP D-Dimer: No results for input(s): DDIMER in the last 72 hours. Hemoglobin A1C: No results for input(s): HGBA1C in the last 72 hours. Fasting Lipid Panel: No results for input(s): CHOL, HDL, LDLCALC,  TRIG, CHOLHDL, LDLDIRECT in the last 72 hours. Thyroid Function Tests: No results for input(s): TSH, T4TOTAL, T3FREE, THYROIDAB in the last 72 hours.  Invalid input(s): FREET3 Anemia Panel: No results for input(s): VITAMINB12, FOLATE, FERRITIN, TIBC, IRON, RETICCTPCT in the last 72 hours.  No results found.   Echo LVEF less than 20%  TELEMETRY: Paced rhythm:  ASSESSMENT AND PLAN:  Principal Problem:   Acute on chronic respiratory failure with hypoxia and hypercapnia (HCC) Active Problems:   Benign prostatic hyperplasia with lower urinary tract symptoms   AF (paroxysmal atrial fibrillation) (HCC)   Acute on chronic combined systolic and diastolic CHF (congestive heart failure) (HCC)   Essential hypertension   NICM (nonischemic cardiomyopathy) (HCC)   Chronic anticoagulation   Acute renal failure (ARF) (HCC)   Pulmonary hypertension (HCC)   Moderate to severe mitral regurgitation   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HCC)   OSA (obstructive sleep apnea)    1.  Acute on chronic systolic congestive heart failure, LVEF less than 20%, status post BiV ICD, clinically improved after diuresis 2.  Chronic atrial fibrillation, on warfarin for stroke prevention, on amiodarone for rate and rhythm control 3.  Acute kidney injury with underlying CKD, creatinine downtrending 4.  Hypotension,  asymptomatic, carvedilol and losartan being held 5.  Urinary retention  Recommendations  1.  Continue diuresis 2.  Carefully monitor renal status 3.  Likely will transition from IV to p.o. furosemide in a.m. 4.  Resume carvedilol and losartan as blood pressure tolerates 5.  Continue warfarin for stroke prevention, target INR 2.0-3.0   Isaias Cowman, MD, PhD, [facility] 11/06/2021 8:30 AM

## 2021-11-06 NOTE — Progress Notes (Signed)
SLP Cancellation Note  Patient Details Name: GEDDY BOYDSTUN MRN: [id number] DOB: 03-14-1941   Cancelled treatment:       Reason Eval/Treat Not Completed:  (chart reviewed; consulted NSG. Pt on BiPAP to rest d/t lack of wearing/resting last night per NSG) NSG reported pt ate a "full" breakfast meal this AM finishing the plate w/ No clinical s/s of aspiration noted; no overt coughing or decline in Pulmonary status during/post meal.  D/t pt's desire to NOT wear his Partial Denture as well as declined Pulmonary status and overall weakness, a Mech Soft diet consistency (cut meats, moistened foods) would be beneficial for safer/easier oral intake; for conservation of energy. Recommend continue on current diet as ordered and general aspiration precautions w/ Pills Whole in Puree for safer swallowing as well. NSG agreed.  NSG to reconsult if any new needs arise while admitted.      Orinda Kenner, MS, CCC-SLP Speech Language Pathologist Rehab Services [phone] [facility] 11/06/2021, 2:40 PM

## 2021-11-06 NOTE — Progress Notes (Signed)
Physical Therapy Treatment Patient Details Name: Adrian Chavez MRN: [id number] DOB: 1941-02-08 Today's Date: 11/06/2021   History of Present Illness Pt is a 81 y/o M who was admitted on 10/31/21 after presenting with c/c of SOB. Pt is being treated for acute hypoxic hypercapnic respiratory failure 2/2 pulmonary edema & severe acute heart failure with acute renal failture consistent with progressive cardiorenal syndrome. PMH: DVT, ischemic colitis, NICM, AKI, chronic anticoagulation, chronic systolic CHF, NYHA class 2, essential HTN, ICD, a-fib, benign prostatic hyperplasia    PT Comments    Pt is making good progress towards goals with ability to ambulate in hallway without AD, although does reach for chair railing and ended up using O2 tank to roll. Recommending continuing use of RW to improve functional independence and decreasing falls risk. Good endurance with seated there-ex, focused on ankles as he reports stiffness. Vital monitored throughout session and will continue to progress as able.  Recommendations for follow up therapy are one component of a multi-disciplinary discharge planning process, led by the attending physician.  Recommendations may be updated based on patient status, additional functional criteria and insurance authorization.  Follow Up Recommendations  Home health PT     Assistance Recommended at Discharge Intermittent Supervision/Assistance  Patient can return home with the following     Equipment Recommendations  Rolling walker (2 wheels)    Recommendations for Other Services       Precautions / Restrictions Precautions Precautions: Fall Restrictions Weight Bearing Restrictions: No     Mobility  Bed Mobility Overal bed mobility: Needs Assistance Bed Mobility: Supine to Sit;Sit to Supine     Supine to sit: Min guard     General bed mobility comments: reaching for therapist hand during transition to sitting.    Transfers Overall transfer level:  Needs assistance Equipment used: 1 person hand held assist Transfers: Sit to/from Stand Sit to Stand: Min guard           General transfer comment: safe technique with good standing static balance once upright    Ambulation/Gait Ambulation/Gait assistance: Min guard Gait Distance (Feet): 200 Feet Assistive device: IV Pole Gait Pattern/deviations: Shuffle;Decreased step length - right;Decreased step length - left       General Gait Details: increased lateral sway and decreased speed. No AD used, however holding onto O2 tank. Does fatigue requiring 1 standing rest break due to O2 sats decreasing to 81% on 2L of O2. Occasionally reaches out for chair railing in hallway during ambulation. HR increases to 104bpm with exertion   Stairs             Wheelchair Mobility    Modified Rankin (Stroke Patients Only)       Balance Overall balance assessment: Needs assistance Sitting-balance support: Feet supported Sitting balance-Leahy Scale: Fair     Standing balance support: No upper extremity supported Standing balance-Leahy Scale: Fair                              Cognition Arousal/Alertness: Awake/alert Behavior During Therapy: WFL for tasks assessed/performed Overall Cognitive Status: Within Functional Limits for tasks assessed                                 General Comments: very motivated and agreeable to ambulation        Exercises Other Exercises Other Exercises: Seated ther-ex including B LE ankle  circles, AP, and calf stretches. 10 reps with cga    General Comments        Pertinent Vitals/Pain Pain Assessment: No/denies pain    Home Living                          Prior Function            PT Goals (current goals can now be found in the care plan section) Acute Rehab PT Goals Patient Stated Goal: get better, go home PT Goal Formulation: With patient Time For Goal Achievement: 11/17/21 Potential to  Achieve Goals: Good Progress towards PT goals: Progressing toward goals    Frequency    Min 2X/week      PT Plan Current plan remains appropriate    Co-evaluation              AM-PAC PT "6 Clicks" Mobility   Outcome Measure  Help needed turning from your back to your side while in a flat bed without using bedrails?: None Help needed moving from lying on your back to sitting on the side of a flat bed without using bedrails?: A Little Help needed moving to and from a bed to a chair (including a wheelchair)?: A Little Help needed standing up from a chair using your arms (e.g., wheelchair or bedside chair)?: A Little Help needed to walk in hospital room?: A Little Help needed climbing 3-5 steps with a railing? : A Lot 6 Click Score: 18    End of Session Equipment Utilized During Treatment: Gait belt;Oxygen Activity Tolerance: Patient tolerated treatment well Patient left: in chair Nurse Communication: Mobility status PT Visit Diagnosis: Unsteadiness on feet (R26.81);Muscle weakness (generalized) (M62.81)     Time: 1610-9604 PT Time Calculation (min) (ACUTE ONLY): 25 min  Charges:  $Gait Training: 8-22 mins $Therapeutic Exercise: 8-22 mins                     Greggory Stallion, PT, DPT [phone]    Jaquanna Ballentine 11/06/2021, 3:59 PM

## 2021-11-06 NOTE — Consult Note (Signed)
PHARMACY CONSULT NOTE  Pharmacy Consult for Electrolyte Monitoring and Replacement   Recent Labs: Potassium (mmol/L)  Date Value  11/06/2021 3.8  04/07/2012 3.7   Magnesium (mg/dL)  Date Value  11/06/2021 2.1   Calcium (mg/dL)  Date Value  11/06/2021 8.7 (L)   Calcium, Total (mg/dL)  Date Value  04/07/2012 8.6   Albumin (g/dL)  Date Value  10/31/2021 3.8  04/07/2012 3.6   Phosphorus (mg/dL)  Date Value  11/05/2021 2.6   Sodium (mmol/L)  Date Value  11/06/2021 140  04/07/2012 141    Assessment: 81yo male with PMH of DVT, Ischemic Colitis, NICM (nonischemic cardiomyopathy), AKI (acute kidney injury), Chronic anticoagulation, HFrEF, Essential hypertension, ICD in place, Atrial fibrillation, Benign prostatic hyperplasia who was admitted to the hospital for SOB. Pharmacy was consulted for electrolyte replacement and monitoring.  Diuresis: Lasix 80 mg IV BID  Goal of Therapy:  Electrolytes within normal limits  Plan:  --K 3.8, will re-order Kcl 20 mEq BID (home regimen) --Follow-up electrolytes with AM labs tomorrow  Benita Gutter 11/06/2021 7:52 AM

## 2021-11-07 LAB — CBC
HCT: 46 % (ref 39.0–52.0)
Hemoglobin: 14 g/dL (ref 13.0–17.0)
MCH: 28.6 pg (ref 26.0–34.0)
MCHC: 30.4 g/dL (ref 30.0–36.0)
MCV: 93.9 fL (ref 80.0–100.0)
Platelets: 132 10*3/uL — ABNORMAL LOW (ref 150–400)
RBC: 4.9 MIL/uL (ref 4.22–5.81)
RDW: 17.2 % — ABNORMAL HIGH (ref 11.5–15.5)
WBC: 3.6 10*3/uL — ABNORMAL LOW (ref 4.0–10.5)
nRBC: 0 % (ref 0.0–0.2)

## 2021-11-07 LAB — BASIC METABOLIC PANEL
Anion gap: 6 (ref 5–15)
BUN: 52 mg/dL — ABNORMAL HIGH (ref 8–23)
CO2: 36 mmol/L — ABNORMAL HIGH (ref 22–32)
Calcium: 8.9 mg/dL (ref 8.9–10.3)
Chloride: 99 mmol/L (ref 98–111)
Creatinine, Ser: 1.7 mg/dL — ABNORMAL HIGH (ref 0.61–1.24)
GFR, Estimated: 41 mL/min — ABNORMAL LOW (ref >60)
Glucose, Bld: 107 mg/dL — ABNORMAL HIGH (ref 70–99)
Potassium: 4 mmol/L (ref 3.5–5.1)
Sodium: 141 mmol/L (ref 135–145)

## 2021-11-07 LAB — PROTIME-INR
INR: 1.8 — ABNORMAL HIGH (ref 0.8–1.2)
Prothrombin Time: 20.6 seconds — ABNORMAL HIGH (ref 11.4–15.2)

## 2021-11-07 MED ORDER — SERTRALINE HCL 50 MG PO TABS
25.0000 mg | ORAL_TABLET | Freq: Every day | ORAL | Status: DC
  Administered 2021-11-07 – 2021-11-08 (×2): 25 mg via ORAL
  Filled 2021-11-07 (×2): qty 1

## 2021-11-07 MED ORDER — TORSEMIDE 20 MG PO TABS
20.0000 mg | ORAL_TABLET | Freq: Two times a day (BID) | ORAL | Status: DC
  Administered 2021-11-07 – 2021-11-08 (×2): 20 mg via ORAL
  Filled 2021-11-07 (×2): qty 1

## 2021-11-07 NOTE — Progress Notes (Signed)
Patient refused to wear his Bipap during this shift. O2 decreased down to the low 80s while asleep. Patient educated

## 2021-11-07 NOTE — Progress Notes (Addendum)
PROGRESS NOTE    Adrian Chavez  VOZ:[id number] DOB: Jul 01, 1941 DOA: 10/31/2021 PCP: Dion Body, MD    Brief Narrative:  81 y.o. 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    In the ER, dyspneic, SpO2 70%, hypotensive.  Cr 2.93 from baseline 1.6, BNP >4500, pH 7.29, pCO2 67.  Placed on BiPAP, given empiric fluids and antibiotics and admitted to ICU.   12/28: Admitted to ICU on BiPAP 12/30: Remained on BiPAP, but improving, Cardiology consulted for diuresis 12/31: Stable for transfer to SDU, remains on IV Lasix, off antibiotics 1/1: Patient remains medically stable.  Continue to diurese.  Low suspicion for infection.  Antibiotics remain on hold   Assessment & Plan:   Principal Problem:   Acute on chronic respiratory failure with hypoxia and hypercapnia (HCC) Active Problems:   Benign prostatic hyperplasia with lower urinary tract symptoms   AF (paroxysmal atrial fibrillation) (HCC)   Acute on chronic combined systolic and diastolic CHF (congestive heart failure) (HCC)   Essential hypertension   NICM (nonischemic cardiomyopathy) (HCC)   Chronic anticoagulation   Acute renal failure (ARF) (HCC)   Pulmonary hypertension (HCC)   Moderate to severe mitral regurgitation   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HCC)   OSA (obstructive sleep apnea)  Acute on chronic heart failure with reduced ejection fraction, LVEF less than 20% Status post BiV ICD Cardiogenic shock, now resolved Nonischemic dilated cardiomyopathy Initially required vasopressor support, wean off as of 12/30 Volume status much improved Plan: Cardiology has resumed home torsemide Coint Coreg 3.125 mg twice daily Currently holding home Entresto, metolazone 2/2 hypotension  Acute hypoxic  respiratory failure secondary to volume overload Infectious process felt unlikely. Has o2 at home but doesn't use regularly - Wean o2 as able  Primary atrial fibrillation Chronic anticoagulation on warfarin Plan: Amiodarone 200 mg daily Warfarin pharmacy dosing assistance Telemetry monitoring  AKI on CKD stage IIIb Creatinine downtrending Avoid nonessential nephrotoxins Careful monitoring of electrolytes while on diuresis  Chronic urinary obstruction Self-caths at home. Has foley here  Left basilic vein superficial thrombophlebitis Swelling of left arm, u/s shows thrombophlebitis - supportive care - consider outpt repeat doppler   Hypertension History of VTE Medication management as above  DVT prophylaxis: Warfarin Code Status: DNR Family Communication: wife updated telephonically 1/4 Disposition Plan: Status is: Inpatient  Remains inpatient appropriate because: severity of illness       Level of care: Telemetry Medical  Consultants:  Cardiology-[facility]  Procedures:  None  Antimicrobials: None   Subjective:  patient seen and examined.  Out of bed in chair. No complaints. Breathing improved.   Objective: Vitals:   11/07/21 0800 11/07/21 0823 11/07/21 1000 11/07/21 1200  BP: 100/77  96/74 (!) 90/59  Pulse: 80  83 75  Resp: 14     Temp: 98.1 F (36.7 C)   98 F (36.7 C)  TempSrc: Oral   Oral  SpO2: 96% 92% 97% 98%  Weight:      Height:        Intake/Output Summary (Last 24 hours) at 11/07/2021 1318 Last data filed at 11/07/2021 1300 Gross per 24 hour  Intake 957 ml  Output 3550 ml  Net -2593 ml   Filed Weights   11/05/21 4259  11/06/21 0500 11/07/21 0500  Weight: 82 kg 82.9 kg 83.1 kg    Examination:  General exam: No acute distress.  Appears fatigued Respiratory system: Bibasilar crackles.  Normal work of breathing.  2 L Cardiovascular system: S1-S2, RRR, no murmurs,  Gastrointestinal system: Mild distended, nontender, positive  bowel sounds Central nervous system: Alert and oriented. No focal neurological deficits. Extremities: Symmetric 5 x 5 power. No LE edema Skin: No rashes, lesions or ulcers Psychiatry: Judgement and insight appear normal. Mood & affect appropriate.     Data Reviewed: I have personally reviewed following labs and imaging studies  CBC: Recent Labs  Lab 10/31/21 2008 11/01/21 0523 11/03/21 0348 11/04/21 0400 11/07/21 0519  WBC 3.9* 4.1 5.7 9.2 3.6*  NEUTROABS 2.1  --   --   --   --   HGB 14.6 13.2 13.8 13.1 14.0  HCT 48.0 43.2 45.1 42.9 46.0  MCV 95.8 94.5 94.4 94.3 93.9  PLT 174 146* 142* 170 702*   Basic Metabolic Panel: Recent Labs  Lab 11/01/21 0523 11/01/21 1818 11/02/21 0342 11/03/21 0348 11/04/21 0400 11/05/21 0445 11/06/21 0501 11/07/21 0519  NA 139   < > 142 143 139 143 140 141  K 3.0*   < > 3.4* 4.0 4.0 3.6 3.8 4.0  CL 100   < > 103 104 102 98 98 99  CO2 31   < > 30 28 31  35* 35* 36*  GLUCOSE 84  --  78 123* 125* 100* 102* 107*  BUN 54*  --  46* 52* 66* 58* 59* 52*  CREATININE 2.68*  --  2.29* 2.21* 2.17* 1.81* 1.81* 1.70*  CALCIUM 8.7*  --  8.8* 8.8* 8.9 8.8* 8.7* 8.9  MG 2.3  --  2.0 2.0 2.1 2.1 2.1  --   PHOS 4.9*  --  4.5 4.1 2.6 2.6  --   --    < > = values in this interval not displayed.   GFR: Estimated Creatinine Clearance: 37.6 mL/min (A) (by C-G formula based on SCr of 1.7 mg/dL (H)). Liver Function Tests: Recent Labs  Lab 10/31/21 2008  AST 57*  ALT 52*  ALKPHOS 92  BILITOT 2.9*  PROT 6.6  ALBUMIN 3.8   No results for input(s): LIPASE, AMYLASE in the last 168 hours. No results for input(s): AMMONIA in the last 168 hours. Coagulation Profile: Recent Labs  Lab 11/03/21 0348 11/04/21 0400 11/05/21 1009 11/06/21 0501 11/07/21 0519  INR 4.1* 4.3* 3.3* 2.0* 1.8*   Cardiac Enzymes: No results for input(s): CKTOTAL, CKMB, CKMBINDEX, TROPONINI in the last 168 hours. BNP (last 3 results) No results for input(s): PROBNP in the last  8760 hours. HbA1C: No results for input(s): HGBA1C in the last 72 hours. CBG: Recent Labs  Lab 10/31/21 2342 11/01/21 1726  GLUCAP 88 98   Lipid Profile: No results for input(s): CHOL, HDL, LDLCALC, TRIG, CHOLHDL, LDLDIRECT in the last 72 hours. Thyroid Function Tests: No results for input(s): TSH, T4TOTAL, FREET4, T3FREE, THYROIDAB in the last 72 hours. Anemia Panel: No results for input(s): VITAMINB12, FOLATE, FERRITIN, TIBC, IRON, RETICCTPCT in the last 72 hours. Sepsis Labs: Recent Labs  Lab 10/31/21 2008 10/31/21 2028 10/31/21 2354  PROCALCITON  --  <0.10  --   LATICACIDVEN 1.7  --  1.6    Recent Results (from the past 240 hour(s))  Blood Culture (routine x 2)     Status: None   Collection Time: 10/31/21  8:08 PM   Specimen: BLOOD  Result  Value Ref Range Status   Specimen Description BLOOD LEFT ANTECUBITAL  Final   Special Requests   Final    BOTTLES DRAWN AEROBIC AND ANAEROBIC Blood Culture adequate volume   Culture   Final    NO GROWTH 6 DAYS Performed at [facility], [address] [place], [place] [address]    Report Status 11/06/2021 FINAL  Final  Blood Culture (routine x 2)     Status: None   Collection Time: 10/31/21  8:13 PM   Specimen: BLOOD  Result Value Ref Range Status   Specimen Description BLOOD BLOOD LEFT FOREARM  Final   Special Requests   Final    BOTTLES DRAWN AEROBIC AND ANAEROBIC Blood Culture adequate volume   Culture   Final    NO GROWTH 6 DAYS Performed at [facility], [address] [place], [place] [address]    Report Status 11/06/2021 FINAL  Final  Resp Panel by RT-PCR (Flu A&B, Covid) Nasopharyngeal Swab     Status: None   Collection Time: 10/31/21  8:28 PM   Specimen: Nasopharyngeal Swab; Nasopharyngeal(NP) swabs in vial transport medium  Result Value Ref Range Status   SARS Coronavirus 2 by RT PCR NEGATIVE NEGATIVE Final    Comment: (NOTE) SARS-CoV-2 target nucleic acids are NOT DETECTED.  The  SARS-CoV-2 RNA is generally detectable in upper respiratory specimens during the acute phase of infection. The lowest concentration of SARS-CoV-2 viral copies this assay can detect is 138 copies/mL. A negative result does not preclude SARS-Cov-2 infection and should not be used as the sole basis for treatment or other patient management decisions. A negative result may occur with  improper specimen collection/handling, submission of specimen other than nasopharyngeal swab, presence of viral mutation(s) within the areas targeted by this assay, and inadequate number of viral copies(<138 copies/mL). A negative result must be combined with clinical observations, patient history, and epidemiological information. The expected result is Negative.  Fact Sheet for Patients:  EntrepreneurPulse.com.au  Fact Sheet for Healthcare Providers:  IncredibleEmployment.be  This test is no t yet approved or cleared by the [place] FDA and  has been authorized for detection and/or diagnosis of SARS-CoV-2 by FDA under an Emergency Use Authorization (EUA). This EUA will remain  in effect (meaning this test can be used) for the duration of the COVID-19 declaration under Section 564(b)(1) of the Act, 21 U.S.C.section 360bbb-3(b)(1), unless the authorization is terminated  or revoked sooner.       Influenza A by PCR NEGATIVE NEGATIVE Final   Influenza B by PCR NEGATIVE NEGATIVE Final    Comment: (NOTE) The Xpert Xpress SARS-CoV-2/FLU/RSV plus assay is intended as an aid in the diagnosis of influenza from Nasopharyngeal swab specimens and should not be used as a sole basis for treatment. Nasal washings and aspirates are unacceptable for Xpert Xpress SARS-CoV-2/FLU/RSV testing.  Fact Sheet for Patients: EntrepreneurPulse.com.au  Fact Sheet for Healthcare Providers: IncredibleEmployment.be  This test is not yet approved or  cleared by the [place] FDA and has been authorized for detection and/or diagnosis of SARS-CoV-2 by FDA under an Emergency Use Authorization (EUA). This EUA will remain in effect (meaning this test can be used) for the duration of the COVID-19 declaration under Section 564(b)(1) of the Act, 21 U.S.C. section 360bbb-3(b)(1), unless the authorization is terminated or revoked.  Performed at [facility], [address], [place], [place] [address]   MRSA Next Gen by PCR, Nasal     Status: None  Collection Time: 10/31/21 11:59 PM   Specimen: Nasal Mucosa; Nasal Swab  Result Value Ref Range Status   MRSA by PCR Next Gen NOT DETECTED NOT DETECTED Final    Comment: (NOTE) The GeneXpert MRSA Assay (FDA approved for NASAL specimens only), is one component of a comprehensive MRSA colonization surveillance program. It is not intended to diagnose MRSA infection nor to guide or monitor treatment for MRSA infections. Test performance is not FDA approved in patients less than 18 years old. Performed at [facility], [address] [place], [place] [address]   Urine Culture     Status: Abnormal   Collection Time: 11/01/21  1:00 AM   Specimen: Urine, Random  Result Value Ref Range Status   Specimen Description   Final    URINE, RANDOM Performed at [facility], [address] [place], [place] [address]    Special Requests   Final    NONE Performed at [facility], [place], [place] [address]    Culture (A)  Final    70,000 COLONIES/mL KLEBSIELLA OXYTOCA 50,000 COLONIES/mL ESCHERICHIA COLI    Report Status 11/04/2021 FINAL  Final   Organism ID, Bacteria KLEBSIELLA OXYTOCA (A)  Final   Organism ID, Bacteria ESCHERICHIA COLI (A)  Final      Susceptibility   Escherichia coli - MIC*    AMPICILLIN 4 SENSITIVE Sensitive     CEFAZOLIN <=4 SENSITIVE Sensitive     CEFEPIME <=0.12 SENSITIVE Sensitive     CEFTRIAXONE <=0.25 SENSITIVE  Sensitive     CIPROFLOXACIN >=4 RESISTANT Resistant     GENTAMICIN <=1 SENSITIVE Sensitive     IMIPENEM <=0.25 SENSITIVE Sensitive     NITROFURANTOIN <=16 SENSITIVE Sensitive     TRIMETH/SULFA <=20 SENSITIVE Sensitive     AMPICILLIN/SULBACTAM <=2 SENSITIVE Sensitive     PIP/TAZO <=4 SENSITIVE Sensitive     * 50,000 COLONIES/mL ESCHERICHIA COLI   Klebsiella oxytoca - MIC*    AMPICILLIN >=32 RESISTANT Resistant     CEFAZOLIN 8 SENSITIVE Sensitive     CEFEPIME <=0.12 SENSITIVE Sensitive     CEFTRIAXONE <=0.25 SENSITIVE Sensitive     CIPROFLOXACIN <=0.25 SENSITIVE Sensitive     GENTAMICIN <=1 SENSITIVE Sensitive     IMIPENEM <=0.25 SENSITIVE Sensitive     NITROFURANTOIN 32 SENSITIVE Sensitive     TRIMETH/SULFA <=20 SENSITIVE Sensitive     AMPICILLIN/SULBACTAM 16 INTERMEDIATE Intermediate     PIP/TAZO <=4 SENSITIVE Sensitive     * 70,000 COLONIES/mL KLEBSIELLA OXYTOCA         Radiology Studies: No results found.      Scheduled Meds:  amiodarone  200 mg Oral Daily   budesonide (PULMICORT) nebulizer solution  0.25 mg Nebulization BID   carvedilol  3.125 mg Oral BID WC   Chlorhexidine Gluconate Cloth  6 each Topical Daily   feeding supplement  237 mL Oral TID BM   losartan  12.5 mg Oral Daily   multivitamin with minerals  1 tablet Oral Daily   potassium chloride  20 mEq Oral BID   sertraline  25 mg Oral Daily   torsemide  20 mg Oral BID   warfarin  2 mg Oral q1600   Warfarin - Pharmacist Dosing Inpatient   Does not apply q1600   Continuous Infusions:   LOS: 7 days    Time spent: 25 minutes    Desma Maxim, MD [facility]   If 7PM-7AM, please contact night-coverage  11/07/2021, 1:18 PM

## 2021-11-07 NOTE — Progress Notes (Signed)
Report called to [employer] and all updates given.

## 2021-11-07 NOTE — Progress Notes (Signed)
[facility] Cardiology    SUBJECTIVE: The patient has no complaints at this time. He denies chest pain, shortness of breath or palpitations.   Vitals:   11/07/21 0600 11/07/21 0700 11/07/21 0800 11/07/21 0823  BP: (!) 83/54 102/74 100/77   Pulse: 80 80 80   Resp: (!) 21 18 14    Temp:   98.1 F (36.7 C)   TempSrc:   Oral   SpO2: 97% 97% 96% 92%  Weight:      Height:         Intake/Output Summary (Last 24 hours) at 11/07/2021 0910 Last data filed at 11/07/2021 0600 Gross per 24 hour  Intake 477 ml  Output 3126 ml  Net -2649 ml      PHYSICAL EXAM  General: Well developed, well nourished, in no acute distress HEENT:  Normocephalic and atramatic Neck:  No JVD.  Lungs: normal WOB, slight bibasilar crackles Heart: IRRR . Normal S1 and S2 without gallops or murmurs.  Abdomen: nondistended Msk:  Back normal, normal gait. Normal strength and tone for age. Extremities: No clubbing, cyanosis or edema.   Neuro: Alert and oriented X 3. Psych:  Good affect, responds appropriately   LABS: Basic Metabolic Panel: Recent Labs    11/05/21 0445 11/06/21 0501 11/07/21 0519  NA 143 140 141  K 3.6 3.8 4.0  CL 98 98 99  CO2 35* 35* 36*  GLUCOSE 100* 102* 107*  BUN 58* 59* 52*  CREATININE 1.81* 1.81* 1.70*  CALCIUM 8.8* 8.7* 8.9  MG 2.1 2.1  --   PHOS 2.6  --   --    Liver Function Tests: No results for input(s): AST, ALT, ALKPHOS, BILITOT, PROT, ALBUMIN in the last 72 hours. No results for input(s): LIPASE, AMYLASE in the last 72 hours. CBC: Recent Labs    11/07/21 0519  WBC 3.6*  HGB 14.0  HCT 46.0  MCV 93.9  PLT 132*   Cardiac Enzymes: No results for input(s): CKTOTAL, CKMB, CKMBINDEX, TROPONINI in the last 72 hours. BNP: Invalid input(s): POCBNP D-Dimer: No results for input(s): DDIMER in the last 72 hours. Hemoglobin A1C: No results for input(s): HGBA1C in the last 72 hours. Fasting Lipid Panel: No results for input(s): CHOL, HDL, LDLCALC, TRIG, CHOLHDL, LDLDIRECT  in the last 72 hours. Thyroid Function Tests: No results for input(s): TSH, T4TOTAL, T3FREE, THYROIDAB in the last 72 hours.  Invalid input(s): FREET3 Anemia Panel: No results for input(s): VITAMINB12, FOLATE, FERRITIN, TIBC, IRON, RETICCTPCT in the last 72 hours.  No results found.   Echo LVEF <20%, moderate MR  TELEMETRY: ventricular paced rhythm  ASSESSMENT AND PLAN:  Principal Problem:   Acute on chronic respiratory failure with hypoxia and hypercapnia (HCC) Active Problems:   Benign prostatic hyperplasia with lower urinary tract symptoms   AF (paroxysmal atrial fibrillation) (HCC)   Acute on chronic combined systolic and diastolic CHF (congestive heart failure) (HCC)   Essential hypertension   NICM (nonischemic cardiomyopathy) (HCC)   Chronic anticoagulation   Acute renal failure (ARF) (HCC)   Pulmonary hypertension (HCC)   Moderate to severe mitral regurgitation   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HCC)   OSA (obstructive sleep apnea)    1. Acute on chronic systolic congestive heart failure, LVEF less than 20%, status post BiV ICD, clinically improved after diuresis. Holding home Entresto, metolazone and torsemide. 2.  Chronic atrial fibrillation, on warfarin for stroke prevention, on amiodarone for rate and rhythm control 3.  Acute kidney injury with underlying CKD, creatinine  downtrending 4.  Hypotension, asymptomatic 5.  Urinary retention, self-caths at home  Recommendations: Continue amiodarone 200 mg daily for rhythm control 2.   Continue carvedilol and losartan, blood pressure permitting 3.   Discontinue IV Lasix and switch to home torsemide 20 mg BID   Clabe Seal, PA-C 11/07/2021 9:10 AM

## 2021-11-07 NOTE — Progress Notes (Signed)
Pt transported to 1C via bed and oxygen.  All belongings with patient at time of transfer.

## 2021-11-07 NOTE — Consult Note (Signed)
PHARMACY CONSULT NOTE  Pharmacy Consult for Electrolyte Monitoring and Replacement   Recent Labs: Potassium (mmol/L)  Date Value  11/07/2021 4.0  04/07/2012 3.7   Magnesium (mg/dL)  Date Value  11/06/2021 2.1   Calcium (mg/dL)  Date Value  11/07/2021 8.9   Calcium, Total (mg/dL)  Date Value  04/07/2012 8.6   Albumin (g/dL)  Date Value  10/31/2021 3.8  04/07/2012 3.6   Phosphorus (mg/dL)  Date Value  11/05/2021 2.6   Sodium (mmol/L)  Date Value  11/07/2021 141  04/07/2012 141    Assessment: 81yo male with PMH of DVT, Ischemic Colitis, NICM (nonischemic cardiomyopathy), AKI (acute kidney injury), Chronic anticoagulation, HFrEF, Essential hypertension, ICD in place, Atrial fibrillation, Benign prostatic hyperplasia who was admitted to the hospital for SOB. Pharmacy was consulted for electrolyte replacement and monitoring.  Diuresis: Lasix 80 mg IV BID  Goal of Therapy:  Electrolytes within normal limits  Plan:  --Continue Kcl 20 mEq BID (home regimen) --Patient care transferred from PCCM to [facility]. Will discontinue electrolyte consult at this time. Defer further ordering of labs and electrolyte replacement to hospitalist --Pharmacy will continue to monitor peripherally  Benita Gutter 11/07/2021 7:52 AM

## 2021-11-07 NOTE — Progress Notes (Signed)
Occupational Therapy Treatment Patient Details Name: Adrian Chavez MRN: [id number] DOB: 18-Aug-1941 Today's Date: 11/07/2021   History of present illness Pt is a 81 y/o M who was admitted on 10/31/21 after presenting with c/c of SOB. Pt is being treated for acute hypoxic hypercapnic respiratory failure 2/2 pulmonary edema & severe acute heart failure with acute renal failture consistent with progressive cardiorenal syndrome. PMH: DVT, ischemic colitis, NICM, AKI, chronic anticoagulation, chronic systolic CHF, NYHA class 2, essential HTN, ICD, a-fib, benign prostatic hyperplasia   OT comments  Adrian Chavez was seen for OT treatment on this date. Upon arrival to room pt reclined in bed, agreeable to tx. Pt requires SBA for in room mobility, ~80 ft, pt reaching out for furniture to steady self, defers walker 2/2 limited space. SUPERVISION for toileting at regular toilet. Pt given red theraband and reviewed HEP including shoulder and elbow felxion/extension 1 set x 10 reps each.  Pt making good progress toward goals. Pt continues to benefit from skilled OT services to maximize return to PLOF and minimize risk of future falls, injury, caregiver burden, and readmission. Will continue to follow POC. Discharge recommendation remains appropriate.     Recommendations for follow up therapy are one component of a multi-disciplinary discharge planning process, led by the attending physician.  Recommendations may be updated based on patient status, additional functional criteria and insurance authorization.    Follow Up Recommendations  Home health OT    Assistance Recommended at Discharge Intermittent Supervision/Assistance  Patient can return home with the following  A little help with bathing/dressing/bathroom;Assistance with cooking/housework   Equipment Recommendations  None recommended by OT    Recommendations for Other Services      Precautions / Restrictions Precautions Precautions:  Fall Restrictions Weight Bearing Restrictions: No       Mobility Bed Mobility Overal bed mobility: Modified Independent                  Transfers Overall transfer level: Needs assistance Equipment used: None Transfers: Sit to/from Stand Sit to Stand: Min guard                 Balance Overall balance assessment: Needs assistance Sitting-balance support: Feet supported Sitting balance-Leahy Scale: Good     Standing balance support: No upper extremity supported Standing balance-Leahy Scale: Fair                             ADL either performed or assessed with clinical judgement   ADL Overall ADL's : Needs assistance/impaired                                       General ADL Comments: SBA for in room mobility, pt reaching out for furniture. SUPERVISION for toileting at regular toilet.      Cognition Arousal/Alertness: Awake/alert Behavior During Therapy: WFL for tasks assessed/performed Overall Cognitive Status: Within Functional Limits for tasks assessed                                 General Comments: very motivated and agreeable to ambulation                     Pertinent Vitals/ Pain       Pain Assessment: No/denies pain   Frequency  Min 2X/week        Progress Toward Goals  OT Goals(current goals can now be found in the care plan section)  Progress towards OT goals: Progressing toward goals  Acute Rehab OT Goals Patient Stated Goal: to walk more OT Goal Formulation: With patient Time For Goal Achievement: 11/17/21 Potential to Achieve Goals: Good ADL Goals Additional ADL Goal #1: Pt will be modified independent with bathing, dressing, and toileting, 3/3 opportunities. Additional ADL Goal #2: Pt will verbalize plan to implement at least 2 learned ECS to maximize safety/indep with ADL/IADL.  Plan Discharge plan remains appropriate;Frequency remains appropriate    Co-evaluation                  AM-PAC OT "6 Clicks" Daily Activity     Outcome Measure   Help from another person eating meals?: None Help from another person taking care of personal grooming?: None Help from another person toileting, which includes using toliet, bedpan, or urinal?: A Little Help from another person bathing (including washing, rinsing, drying)?: A Little Help from another person to put on and taking off regular upper body clothing?: None Help from another person to put on and taking off regular lower body clothing?: A Little 6 Click Score: 21    End of Session    OT Visit Diagnosis: Other abnormalities of gait and mobility (R26.89);Muscle weakness (generalized) (M62.81)   Activity Tolerance Patient tolerated treatment well   Patient Left in bed;with call bell/phone within reach   Nurse Communication Mobility status        Time: 1455-1520 OT Time Calculation (min): 25 min  Charges: OT General Charges $OT Visit: 1 Visit OT Treatments $Self Care/Home Management : 8-22 mins $Therapeutic Exercise: 8-22 mins  Dessie Coma, M.S. OTR/L  11/07/21, 3:32 PM  ascom [phone]

## 2021-11-07 NOTE — Consult Note (Signed)
[place] for Warfarin  Indication: atrial fibrillation and LV thrombus  Patient Measurements: Height: 5\' 8"  (172.7 cm) Weight: 83.1 kg (183 lb 3.2 oz) IBW/kg (Calculated) : 68.4  Labs: Recent Labs    11/05/21 0445 11/05/21 1009 11/06/21 0501 11/07/21 0519  HGB  --   --   --  14.0  HCT  --   --   --  46.0  PLT  --   --   --  132*  LABPROT  --  33.5* 22.9* 20.6*  INR  --  3.3* 2.0* 1.8*  CREATININE 1.81*  --  1.81* 1.70*     Estimated Creatinine Clearance: 37.6 mL/min (A) (by C-G formula based on SCr of 1.7 mg/dL (H)).   Medical History: Past Medical History:  Diagnosis Date   Acute urinary retention 03/05/2021   Anxiety    CHF (congestive heart failure) ([place])    Claustrophobia    Colitis, ischemic ([place]) 2010   developed DVT   DVT (deep venous thrombosis) ([place]) 2010   stomach and leg   Dysrhythmia    A-Fib   History of 2019 novel coronavirus disease (COVID-19) 11/22/2019   Hypertension      Assessment: 81yo male with PMH of DVT, Ischemic Colitis, NICM (nonischemic cardiomyopathy), AKI (acute kidney injury), Chronic anticoagulation, Chronic systolic CHF, NYHA class 2, Essential hypertension, ICD in place, Atrial fibrillation, Benign prostatic hyperplasia who was admitted to the hospital for SOB. Pharmacy was consulted for warfarin dosing and monitoring.  PTA regimen: 2mg  daily (TWD=14 mg) (confirmed with PCP)  Last dose: 12/27  DDI: amiodarone (home medication)  Date/time INR Warfarin Dose 12/28   3.9 HOLD 12/29   3.9 HOLD 12/30   3.5 HOLD 12/31   4.1 HOLD 1/1  4.3 HOLD 1/2  3.3 Warfarin 2 mg 1/3  2.0 Warfarin 2 mg 1/4  1.8 Warfarin 2 mg  Goal of Therapy:  INR 2-3 Monitor platelets by anticoagulation protocol: Yes   Plan:  --INR subtherapeutic. Anticipate several days for warfarin effect given missed doses --Continue home regimen of warfarin 2 mg daily --Daily INR per protocol --CBC at least every 3 days per  protocol; stable  Benita Gutter 11/07/2021 7:50 AM

## 2021-11-08 LAB — BASIC METABOLIC PANEL
Anion gap: 9 (ref 5–15)
BUN: 47 mg/dL — ABNORMAL HIGH (ref 8–23)
CO2: 35 mmol/L — ABNORMAL HIGH (ref 22–32)
Calcium: 8.7 mg/dL — ABNORMAL LOW (ref 8.9–10.3)
Chloride: 95 mmol/L — ABNORMAL LOW (ref 98–111)
Creatinine, Ser: 1.59 mg/dL — ABNORMAL HIGH (ref 0.61–1.24)
GFR, Estimated: 44 mL/min — ABNORMAL LOW (ref >60)
Glucose, Bld: 93 mg/dL (ref 70–99)
Potassium: 3.9 mmol/L (ref 3.5–5.1)
Sodium: 139 mmol/L (ref 135–145)

## 2021-11-08 LAB — GLUCOSE, CAPILLARY
Glucose-Capillary: 95 mg/dL (ref 70–99)
Glucose-Capillary: 83 mg/dL (ref 70–99)

## 2021-11-08 LAB — PROTIME-INR
INR: 1.7 — ABNORMAL HIGH (ref 0.8–1.2)
Prothrombin Time: 19.6 seconds — ABNORMAL HIGH (ref 11.4–15.2)

## 2021-11-08 MED ORDER — AMIODARONE HCL 200 MG PO TABS: 200.0000 mg | ORAL_TABLET | Freq: Every day | ORAL | Status: DC

## 2021-11-08 MED ORDER — WARFARIN SODIUM 4 MG PO TABS
4.0000 mg | ORAL_TABLET | Freq: Once | ORAL | Status: DC
  Filled 2021-11-08: qty 1

## 2021-11-08 MED ORDER — ENSURE ENLIVE PO LIQD: 237.0000 mL | Freq: Two times a day (BID) | ORAL | Status: DC

## 2021-11-08 NOTE — Progress Notes (Signed)
SATURATION QUALIFICATIONS: (This note is used to comply with regulatory documentation for home oxygen)  Patient Saturations on Room Air at Rest = 92%  Patient Saturations on Room Air while Ambulating = 87%  Patient Saturations on 2 Liters of oxygen while Ambulating = 90%  Please briefly explain why patient needs home oxygen: Patient requires 2L of oxygen while ambulating to maintain saturations at an acceptable level

## 2021-11-08 NOTE — Care Management Important Message (Signed)
Important Message  Patient Details  Name: Adrian Chavez MRN: [id number] Date of Birth: 11/17/1940   Medicare Important Message Given:  Yes     Adrian Chavez 11/08/2021, 1:23 PM

## 2021-11-08 NOTE — Progress Notes (Signed)
[facility] Cardiology    SUBJECTIVE: The patient reports feeling "good." He denies chest pain or shortness of breath. He is eager to get up and walk around today.   Vitals:   11/08/21 0008 11/08/21 0501 11/08/21 0849 11/08/21 1021  BP: 99/64 103/74 106/70   Pulse: (!) 59 78 81 70  Resp: 19 20 16    Temp: 98.2 F (36.8 C) 98.4 F (36.9 C) 98.4 F (36.9 C)   TempSrc:   Oral   SpO2: 95% 94% 95% 92%  Weight:      Height:         Intake/Output Summary (Last 24 hours) at 11/08/2021 1026 Last data filed at 11/08/2021 1018 Gross per 24 hour  Intake 440 ml  Output 1650 ml  Net -1210 ml      PHYSICAL EXAM  General: Well developed, well nourished, in no acute distress, sitting up in bed eating breakfast HEENT:  Normocephalic and atramatic Neck:  No JVD.  Lungs: Clear bilaterally to auscultation, normal WOB on RA  Heart: HRRR . Normal S1 and S2 without gallops or murmurs.  Abdomen:no distention Extremities: No clubbing, cyanosis or edema of LE. Left arm edematous Neuro: Alert and oriented X 3. Psych:  Good affect, responds appropriately   LABS: Basic Metabolic Panel: Recent Labs    11/06/21 0501 11/07/21 0519 11/08/21 0630  NA 140 141 139  K 3.8 4.0 3.9  CL 98 99 95*  CO2 35* 36* 35*  GLUCOSE 102* 107* 93  BUN 59* 52* 47*  CREATININE 1.81* 1.70* 1.59*  CALCIUM 8.7* 8.9 8.7*  MG 2.1  --   --    Liver Function Tests: No results for input(s): AST, ALT, ALKPHOS, BILITOT, PROT, ALBUMIN in the last 72 hours. No results for input(s): LIPASE, AMYLASE in the last 72 hours. CBC: Recent Labs    11/07/21 0519  WBC 3.6*  HGB 14.0  HCT 46.0  MCV 93.9  PLT 132*   Cardiac Enzymes: No results for input(s): CKTOTAL, CKMB, CKMBINDEX, TROPONINI in the last 72 hours. BNP: Invalid input(s): POCBNP D-Dimer: No results for input(s): DDIMER in the last 72 hours. Hemoglobin A1C: No results for input(s): HGBA1C in the last 72 hours. Fasting Lipid Panel: No results for input(s): CHOL,  HDL, LDLCALC, TRIG, CHOLHDL, LDLDIRECT in the last 72 hours. Thyroid Function Tests: No results for input(s): TSH, T4TOTAL, T3FREE, THYROIDAB in the last 72 hours.  Invalid input(s): FREET3 Anemia Panel: No results for input(s): VITAMINB12, FOLATE, FERRITIN, TIBC, IRON, RETICCTPCT in the last 72 hours.  No results found.   Echo LVEF <20%, moderate MR  TELEMETRY: V-paced rhythm, 80s  ASSESSMENT AND PLAN:  Principal Problem:   Acute on chronic respiratory failure with hypoxia and hypercapnia (HCC) Active Problems:   Benign prostatic hyperplasia with lower urinary tract symptoms   AF (paroxysmal atrial fibrillation) (HCC)   Acute on chronic combined systolic and diastolic CHF (congestive heart failure) (HCC)   Essential hypertension   NICM (nonischemic cardiomyopathy) (HCC)   Chronic anticoagulation   Acute renal failure (ARF) (HCC)   Pulmonary hypertension (HCC)   Moderate to severe mitral regurgitation   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HCC)   OSA (obstructive sleep apnea)    1. Acute on chronic systolic congestive heart failure, LVEF less than 20%, status post BiV ICD, clinically improved after diuresis. Holding home Entresto, metolazone and torsemide. 2.  Chronic atrial fibrillation, on warfarin for stroke prevention, on amiodarone for rate and rhythm control 3.  Acute kidney  injury with underlying CKD, creatinine downtrending 4.  Hypotension, asymptomatic 5.  Urinary retention, self-caths at home   Recommendations: Continue amiodarone 200 mg daily for rhythm control 2.   Continue carvedilol and losartan, blood pressure permitting. Continue to hold Entresto and metolazone. 3.   Continue home torsemide 20 mg BID 4.  Ambulate today; consider discharge today and follow-up with Dr. Saralyn Pilar in 1 week.   Clabe Seal, PA-C 11/08/2021 10:26 AM

## 2021-11-08 NOTE — TOC Progression Note (Addendum)
Transition of Care [facility]) - Progression Note    Patient Details  Name: ROLANDO HESSLING MRN: [id number] Date of Birth: 1940-12-15  Transition of Care [facility]) CM/SW [place], RN Phone Number: 11/08/2021, 1:06 PM  Clinical Narrative:   Patient is set up with [facility] confirmed by [place].  He accepts home health and home oxygen as recommended by care team.  Patient states he has oxygen at home, in his garage along with a CPAP, but he is unable to use the CPAP.  He is current with Dr. Netty Starring, PCP and sees no concerns with transportation or making his follow up appointments.  He has assistance getting to the pharmacy and takes medications as directed.  Patient's wife assists him at home, and what she is unable to do, his sons assist him with.    Addendum:  1427 pm:  After some research, patient's home CPAP company is [employer].  RNCM spoke to [place], who asked for orders to be faxed.  Fax sent, awaiting confirmation of portable oxygen delivery prior to discharge today.Patient has delivery of rolling walker to room by [employer]  Addendum:  3500: Jeneen Rinks from [place] verifies that oxygen will be delivered to patient within the hour for discharge.  Expected Discharge Plan: [place] Barriers to Discharge: Continued Medical Work up  Expected Discharge Plan and Services Expected Discharge Plan: [place]   Discharge Planning Services: CM Consult Post Acute Care Choice: [place] arrangements for the past 2 months: Single Family Home Expected Discharge Date: 11/08/21               DME Arranged: Gilford Rile rolling DME Agency: [employer]       HH Arranged: RN, PT, OT HH Agency: [facility] Date HH Agency Contacted: 11/05/21 Time [place]: [id number] Representative spoke with at [place]: [place]   Social Determinants of Health ([place]) Interventions    Readmission Risk Interventions Readmission Risk Prevention Plan  11/08/2021  Transportation Screening Complete  PCP or Specialist Appt within 5-7 Days Complete  Home Care Screening Complete  Medication Review (RN CM) Complete  Some recent data might be hidden

## 2021-11-08 NOTE — Progress Notes (Signed)
Occupational Therapy Treatment Patient Details Name: Adrian Chavez MRN: [id number] DOB: 1940/12/17 Today's Date: 11/08/2021   History of present illness Pt is a 81 y/o M who was admitted on 10/31/21 after presenting with c/c of SOB. Pt is being treated for acute hypoxic hypercapnic respiratory failure 2/2 pulmonary edema & severe acute heart failure with acute renal failture consistent with progressive cardiorenal syndrome. PMH: DVT, ischemic colitis, NICM, AKI, chronic anticoagulation, chronic systolic CHF, NYHA class 2, essential HTN, ICD, a-fib, benign prostatic hyperplasia   OT comments  Pt seen for OT tx this date to f/u re: safety with ADLs/ADL mobility. He is generally feeling better and agreeable to tx session. OT engages pt in seated LB ADLs with cues for modified techniques for energy conservation and engages pt in functional mobility with RW around nsg unit with SBA on RA with sats 88-92%. Pt returned to bed end of session with all needs met and in reach, requesting ice which is approved with RN given pt's fluid restriction. Will continue to follow acutely. Continue to anticipate pt will require f/u HHOT for home modifications given energy conservation needs.    Recommendations for follow up therapy are one component of a multi-disciplinary discharge planning process, led by the attending physician.  Recommendations may be updated based on patient status, additional functional criteria and insurance authorization.    Follow Up Recommendations  Home health OT    Assistance Recommended at Discharge Intermittent Supervision/Assistance  Patient can return home with the following  A little help with bathing/dressing/bathroom;Assistance with cooking/housework   Equipment Recommendations  None recommended by OT    Recommendations for Other Services      Precautions / Restrictions Precautions Precautions: Fall Restrictions Weight Bearing Restrictions: No       Mobility Bed  Mobility Overal bed mobility: Modified Independent                  Transfers Overall transfer level: Needs assistance Equipment used: Rolling walker (2 wheels) Transfers: Sit to/from Stand Sit to Stand: Min guard;Supervision           General transfer comment: CGA for intial stand with no AD, but pt noted to be attempting to reach out and hold onto counter top, wall, bed, etc. Gave pt RW and able to CTS and perform fxl mobility w/ SBA only, requries cues for safe use     Balance Overall balance assessment: Needs assistance Sitting-balance support: Feet supported Sitting balance-Leahy Scale: Good     Standing balance support: No upper extremity supported Standing balance-Leahy Scale: Fair                             ADL either performed or assessed with clinical judgement   ADL Overall ADL's : Needs assistance/impaired                                       General ADL Comments: SUPV with RW for fxl mobility, increased time and SBA for seated dynamic balance for LB ADLs including donning socks    Extremity/Trunk Assessment              Vision       Perception     Praxis      Cognition Arousal/Alertness: Awake/alert Behavior During Therapy: WFL for tasks assessed/performed Overall Cognitive Status: Within Functional Limits for tasks assessed  Exercises Other Exercises Other Exercises: OT engages pt in seated LB ADLs with cues for modified techniques for energy conservation and engages pt in functional mobility with RW around nsg unit with SBA on RA with sats 88-92%   Shoulder Instructions       General Comments      Pertinent Vitals/ Pain       Pain Assessment: No/denies pain  Home Living                                          Prior Functioning/Environment              Frequency  Min 2X/week        Progress Toward  Goals  OT Goals(current goals can now be found in the care plan section)  Progress towards OT goals: Progressing toward goals  Acute Rehab OT Goals Patient Stated Goal: to walk more OT Goal Formulation: With patient Time For Goal Achievement: 11/17/21 Potential to Achieve Goals: Good  Plan Discharge plan remains appropriate;Frequency remains appropriate    Co-evaluation                 AM-PAC OT "6 Clicks" Daily Activity     Outcome Measure   Help from another person eating meals?: None Help from another person taking care of personal grooming?: None Help from another person toileting, which includes using toliet, bedpan, or urinal?: A Little Help from another person bathing (including washing, rinsing, drying)?: A Little Help from another person to put on and taking off regular upper body clothing?: None Help from another person to put on and taking off regular lower body clothing?: A Little 6 Click Score: 21    End of Session Equipment Utilized During Treatment: Rolling walker (2 wheels)  OT Visit Diagnosis: Other abnormalities of gait and mobility (R26.89);Muscle weakness (generalized) (M62.81)   Activity Tolerance Patient tolerated treatment well   Patient Left in bed;with call bell/phone within reach   Nurse Communication Mobility status        Time: 1610-9604 OT Time Calculation (min): 24 min  Charges: OT General Charges $OT Visit: 1 Visit OT Treatments $Self Care/Home Management : 8-22 mins $Therapeutic Activity: 8-22 mins  Gerrianne Scale, [place], OTR/L ascom [phone] 11/08/21, 4:38 PM

## 2021-11-08 NOTE — Discharge Summary (Signed)
Adrian Chavez:[id number] DOB: 05/16/1941 DOA: 10/31/2021  PCP: Adrian Body, MD  Admit date: 10/31/2021 Discharge date: 11/08/2021  Time spent: 35 minutes  Recommendations for Outpatient Follow-up:  Cardiology f/u 1-2 weeks INR 1 week     Discharge Diagnoses:  Principal Problem:   Acute on chronic respiratory failure with hypoxia and hypercapnia (HCC) Active Problems:   Benign prostatic hyperplasia with lower urinary tract symptoms   AF (paroxysmal atrial fibrillation) (HCC)   Acute on chronic combined systolic and diastolic CHF (congestive heart failure) (HCC)   Essential hypertension   NICM (nonischemic cardiomyopathy) (HCC)   Chronic anticoagulation   Acute renal failure (ARF) (HCC)   Pulmonary hypertension (HCC)   Moderate to severe mitral regurgitation   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HCC)   OSA (obstructive sleep apnea)   Discharge Condition: stable  Diet recommendation: heart healthy  Filed Weights   11/05/21 0213 11/06/21 0500 11/07/21 0500  Weight: 82 kg 82.9 kg 83.1 kg    History of present illness:  81 y.o male  with significant PMH of DVT, Ischemic Colitis, NICM (nonischemic cardiomyopathy), AKI (acute kidney injury), Chronic anticoagulation, History of 2019 novel coronavirus disease ([id number]-[id number]), Chronic systolic CHF (congestive heart failure), NYHA class 2, Essential hypertension, ICD (implantable cardioverter-defibrillator) in place, Atrial fibrillation, Benign prostatic hyperplasia with lower urinary tract symptoms who presented to the ED with chief complaints of progressive shortness of breath.   Per family members at the bedside, EMS was called for complaints of progressive SOB. The family members reported that the patient has been more weak for a week due to nausea, vomiting, and diarrhea. No reports of fever, chills, chest pain, abdominal pain, or cough. On EMS arrival, patient was already on NRB, he was hypotensive, but alert and oriented.  After IV access was obtained, responders began administering fluid and placing the patient on the stair chair. Enroute the patient  received IV fluids. The patient remained alert and oriented, but became lethargic upon arrival to the hospital.   Hospital Course:  Acute on chronic heart failure with reduced ejection fraction, LVEF less than 20% Status post BiV ICD Cardiogenic shock, now resolved Nonischemic dilated cardiomyopathy Initially required vasopressor support and bipap, weaned off both0 Volume status much improved Plan: Cardiology has resumed home torsemide Coint Coreg 3.125 mg twice daily Currently holding home Entresto, metolazone 2/2 hypotension Close outpt cardiology f/u, referral to heart failure clinic as well   Acute hypoxic respiratory failure secondary to volume overload Infectious process felt unlikely. Has o2 at home but doesn't use regularly - ambulatory o2 at discharge, wean as able   Primary atrial fibrillation Chronic anticoagulation on warfarin Plan: Amiodarone 200 mg daily Continue warfarin INR 1 week   AKI on CKD stage IIIb Resolved with diuresis   Chronic urinary obstruction Self-caths at home. Foley catheter while inpatient   Left basilic vein superficial thrombophlebitis Swelling of left arm, u/s shows thrombophlebitis - supportive care - on warfarin as above    Procedures: none   Consultations: pccm  Discharge Exam: Vitals:   11/08/21 1021 11/08/21 1201  BP:  104/80  Pulse: 70 81  Resp:  18  Temp:  98.1 F (36.7 C)  SpO2: 92% 97%    General exam: No acute distress.  Appears fatigued Respiratory system: Bibasilar crackles.  Normal work of breathing.   Cardiovascular system: S1-S2, RRR, no murmurs,  Gastrointestinal system: Mild distended, nontender, positive bowel sounds Central nervous system: Alert and oriented. No focal neurological deficits. Extremities: Symmetric  5 x 5 power. No LE edema Skin: No rashes, lesions or  ulcers Psychiatry: Judgement and insight appear normal. Mood & affect appropriate.   Discharge Instructions   Discharge Instructions     AMB referral to CHF clinic   Complete by: As directed    Diet - low sodium heart healthy   Complete by: As directed    Increase activity slowly   Complete by: As directed       Allergies as of 11/08/2021       Reactions   Escitalopram Oxalate Nausea Only   Oxycodone    vomiting        Medication List     STOP taking these medications    Entresto 49-51 MG Generic drug: sacubitril-valsartan   metolazone 5 MG tablet Commonly known as: ZAROXOLYN       TAKE these medications    albuterol 108 (90 Base) MCG/ACT inhaler Commonly known as: VENTOLIN HFA SMARTSIG:1-2 Inhalation Via Inhaler Every 4 Hours PRN   ALPRAZolam 0.5 MG tablet Commonly known as: XANAX Take 0.5 mg by mouth daily as needed for anxiety.   amiodarone 200 MG tablet Commonly known as: PACERONE Take 1 tablet (200 mg total) by mouth daily. What changed: when to take this   carvedilol 3.125 MG tablet Commonly known as: COREG Take 3.125 mg by mouth 2 (two) times daily.   cetirizine 10 MG tablet Commonly known as: ZYRTEC Take 1 tablet by mouth daily.   Cialis 5 MG tablet Generic drug: tadalafil Take 5 mg by mouth daily.   finasteride 5 MG tablet Commonly known as: Proscar Take 1 tablet (5 mg total) by mouth daily.   fluticasone 50 MCG/ACT nasal spray Commonly known as: FLONASE Place 2 sprays into both nostrils daily as needed for allergies or rhinitis.   potassium chloride SA 20 MEQ tablet Commonly known as: KLOR-CON M Take 20 mEq by mouth 2 (two) times daily.   sertraline 25 MG tablet Commonly known as: ZOLOFT Take 25 mg by mouth daily.   torsemide 20 MG tablet Commonly known as: DEMADEX Take 20 mg by mouth 2 (two) times daily.   vitamin B-12 1000 MCG tablet Commonly known as: CYANOCOBALAMIN Take 1,000 mcg by mouth daily.   warfarin 1 MG  tablet Commonly known as: COUMADIN Take 2 mg by mouth See admin instructions. Take 2 MG by mouth every day               Durable Medical Equipment  (From admission, onward)           Start     Ordered   11/08/21 1236  DME Oxygen  Once       Question Answer Comment  Length of Need 6 Months   Mode or (Route) Nasal cannula   Liters per Minute 2   Frequency Continuous (stationary and portable oxygen unit needed)   Oxygen delivery system Gas      11/08/21 1247           Allergies  Allergen Reactions   Escitalopram Oxalate Nausea Only   Oxycodone     vomiting      The results of significant diagnostics from this hospitalization (including imaging, microbiology, ancillary and laboratory) are listed below for reference.    Significant Diagnostic Studies: DG Chest 1 View  Result Date: 11/02/2021 CLINICAL DATA:  81 year old male with history of DVT. EXAM: CHEST  1 VIEW COMPARISON:  Chest x-ray 10/31/2021. FINDINGS: Lung volumes are normal. There is cephalization of  the pulmonary vasculature and slight indistinctness of the interstitial markings suggestive of mild pulmonary edema. Trace left pleural effusion. No definite right pleural effusion. No pneumothorax. Moderate to severe cardiomegaly. The patient is rotated to the right on today's exam, resulting in distortion of the mediastinal contours and reduced diagnostic sensitivity and specificity for mediastinal pathology. Atherosclerotic calcifications in the thoracic aorta. Left-sided biventricular pacemaker/AICD with lead tips projecting over the expected location of the right atrium, right ventricle and lateral wall the left ventricle via the coronary sinus and coronary veins. IMPRESSION: 1. The appearance the chest suggest congestive heart failure, as above. Electronically Signed   By: Vinnie Langton M.D.   On: 11/02/2021 07:22   DG Abd 1 View  Result Date: 10/31/2021 CLINICAL DATA:  Abdominal pain. EXAM: ABDOMEN -  1 VIEW COMPARISON:  October 27, 2010 FINDINGS: Mild atelectatic changes are seen within the bilateral lung bases. The cardiac silhouette is markedly enlarged. The bowel gas pattern is normal. Multiple small radiopaque surgical coils are seen overlying the lower pelvis on the right. No radio-opaque calculi or other significant radiographic abnormality are seen. IMPRESSION: 1. Normal bowel gas pattern without evidence of renal calculi. 2. Mild bibasilar atelectasis. Electronically Signed   By: Virgina Norfolk M.D.   On: 10/31/2021 23:39   US Venous Img Upper Uni Left (DVT)  Result Date: 11/01/2021 CLINICAL DATA:  Bruising of left forearm status post fall 1 week ago Edema Pain EXAM: LEFT UPPER EXTREMITY VENOUS DOPPLER ULTRASOUND TECHNIQUE: Gray-scale sonography with graded compression, as well as color Doppler and duplex ultrasound were performed to evaluate the upper extremity deep venous system from the level of the subclavian vein and including the jugular, axillary, basilic, radial, ulnar and upper cephalic vein. Spectral Doppler was utilized to evaluate flow at rest and with distal augmentation maneuvers. COMPARISON:  None. FINDINGS: Contralateral Subclavian Vein: Respiratory phasicity is normal and symmetric with the symptomatic side. No evidence of thrombus. Normal compressibility. Internal Jugular Vein: No evidence of thrombus. Normal compressibility, respiratory phasicity and response to augmentation. Subclavian Vein: No evidence of thrombus. Normal compressibility, respiratory phasicity and response to augmentation. Axillary Vein: No evidence of thrombus. Normal compressibility, respiratory phasicity and response to augmentation. Cephalic Vein: No evidence of thrombus. Normal compressibility, respiratory phasicity and response to augmentation. 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 Brachial Veins: No evidence of thrombus. Normal  compressibility, respiratory phasicity and response to augmentation. Radial Veins: No evidence of thrombus. Normal compressibility, respiratory phasicity and response to augmentation. Ulnar Veins: No evidence of thrombus. Normal compressibility, respiratory phasicity and response to augmentation. Venous Reflux:  None visualized. Other Findings:  None visualized. IMPRESSION: 1. No left lower extremity DVT. 2. Minimal partially occlusive web-like thrombus in the proximal left basilic vein is consistent with chronic superficial venous thrombosis. Electronically Signed   By: Miachel Roux M.D.   On: 11/01/2021 17:29   DG Chest Port 1 View  Result Date: 10/31/2021 CLINICAL DATA:  Questionable sepsis.  Evaluate for abnormality. EXAM: PORTABLE CHEST 1 VIEW COMPARISON:  03/05/2021 FINDINGS: Multi lead ICD is identified with battery pack in the left chest wall. Marked cardiac enlargement is unchanged. There is blunting of the left costophrenic angle, new from previous exam. This may represent a small effusion. Diffuse pulmonary vascular congestion. No frank edema or airspace consolidation. Platelike atelectasis is noted in the right lung base. IMPRESSION: 1. Cardiac enlargement, small left pleural effusion and pulmonary vascular congestion. Correlate for any signs  or symptoms of CHF. 2. Right base atelectasis. Electronically Signed   By: Kerby Moors M.D.   On: 10/31/2021 20:26   DG Shoulder Left Port  Result Date: 11/01/2021 CLINICAL DATA:  Left shoulder pain EXAM: LEFT SHOULDER COMPARISON:  None. FINDINGS: Mild AC joint degenerative change. Moderate glenohumeral degenerative change. No fracture or dislocation. Faint calcifications at the superolateral humeral head. IMPRESSION: 1. No acute osseous abnormality 2. Moderate degenerative changes with calcific tendinopathy. Electronically Signed   By: Donavan Foil M.D.   On: 11/01/2021 16:10   ECHOCARDIOGRAM COMPLETE  Result Date: 11/01/2021    ECHOCARDIOGRAM  REPORT   Patient Name:   Adrian Chavez Date of Exam: 11/01/2021 Medical Rec #:  [id number]    Height:       68.0 in Accession #:    [id number]   Weight:       184.7 lb Date of Birth:  11-02-1941    BSA:          1.976 m Patient Age:    81 years     BP:           101/73 mmHg Patient Gender: M            HR:           78 bpm. Exam Location:  [facility] Procedure: 2D Echo, Cardiac Doppler and Color Doppler Indications:     CHF-acute diastolic T41.96                  CHF-acute systolic Q22.97  History:         Patient has no prior history of Echocardiogram examinations.                  CHF; Risk Factors:Hypertension.  Sonographer:     Sherrie Sport Referring Phys:  [id number] [facility] OUMA Diagnosing Phys: Donnelly Angelica  Sonographer Comments: Suboptimal apical window. IMPRESSIONS  1. Left ventricular ejection fraction, by estimation, is <20%. The left ventricle has severely decreased function. The left ventricle demonstrates regional wall motion abnormalities (see scoring diagram/findings for description). The left ventricular internal cavity size was mildly dilated. Left ventricular diastolic parameters are indeterminate. There is severe hypokinesis of the left ventricular, entire inferior wall and inferolateral wall.  2. Right ventricular systolic function reduced. The right ventricular size is not well visualized.  3. Left atrial size was severely dilated.  4. Right atrial size was severely dilated.  5. The mitral valve is grossly normal. Moderate mitral valve regurgitation.  6. The aortic valve is normal in structure. Aortic valve regurgitation is not visualized. Aortic valve sclerosis is present, with no evidence of aortic valve stenosis.  7. Mild pulmonic stenosis. FINDINGS  Left Ventricle: Left ventricular ejection fraction, by estimation, is <20%. The left ventricle has severely decreased function. The left ventricle demonstrates regional wall motion abnormalities. Severe hypokinesis of the left ventricular, entire  inferior wall and inferolateral wall. The left ventricular internal cavity size was mildly dilated. There is no left ventricular hypertrophy. Left ventricular diastolic parameters are indeterminate. Right Ventricle: The right ventricular size is not well visualized. Right vetricular wall thickness was not well visualized. Right ventricular systolic function reduced. Left Atrium: Left atrial size was severely dilated. Right Atrium: Right atrial size was severely dilated. Pericardium: There is no evidence of pericardial effusion. Mitral Valve: The mitral valve is grossly normal. Moderate mitral valve regurgitation. Tricuspid Valve: The tricuspid valve is normal in structure. Tricuspid valve regurgitation is not demonstrated. Aortic Valve: The  aortic valve is normal in structure. Aortic valve regurgitation is not visualized. Aortic valve sclerosis is present, with no evidence of aortic valve stenosis. Aortic valve mean gradient measures 2.0 mmHg. Aortic valve peak gradient measures 3.4 mmHg. Aortic valve area, by VTI measures 2.47 cm. Pulmonic Valve: The pulmonic valve was not well visualized. Pulmonic valve regurgitation is not visualized. Mild pulmonic stenosis. Aorta: The aortic root is normal in size and structure. Venous: The inferior vena cava was not well visualized. IAS/Shunts: The interatrial septum was not well visualized.  LEFT VENTRICLE PLAX 2D LVIDd:         6.20 cm LVIDs:         5.70 cm LV PW:         1.40 cm LV IVS:        0.95 cm LVOT diam:     2.10 cm LV SV:         32 LV SV Index:   16 LVOT Area:     3.46 cm  LV Volumes (MOD) LV vol d, MOD A4C: 328.0 ml LV vol s, MOD A4C: 219.0 ml LV SV MOD A4C:     328.0 ml RIGHT VENTRICLE RV Basal diam:  4.70 cm RV S prime:     9.68 cm/s TAPSE (M-mode): 4.2 cm LEFT ATRIUM              Index         RIGHT ATRIUM           Index LA diam:        8.30 cm  4.20 cm/m    RA Area:     50.50 cm LA Vol (A2C):   554.0 ml 280.37 ml/m  RA Volume:   230.00 ml 116.40 ml/m  LA Vol (A4C):   339.0 ml 171.56 ml/m LA Biplane Vol: 439.0 ml 222.17 ml/m  AORTIC VALVE                    PULMONIC VALVE AV Area (Vmax):    2.39 cm     PV Vmax:        0.60 m/s AV Area (Vmean):   2.21 cm     PV Vmean:       37.450 cm/s AV Area (VTI):     2.47 cm     PV VTI:         0.081 m AV Vmax:           91.80 cm/s   PV Peak grad:   1.4 mmHg AV Vmean:          62.100 cm/s  PV Mean grad:   1.0 mmHg AV VTI:            0.130 m      RVOT Peak grad: 3 mmHg AV Peak Grad:      3.4 mmHg AV Mean Grad:      2.0 mmHg LVOT Vmax:         63.30 cm/s LVOT Vmean:        39.600 cm/s LVOT VTI:          0.093 m LVOT/AV VTI ratio: 0.71  AORTA Ao Root diam: 3.10 cm MITRAL VALVE                TRICUSPID VALVE MV Area (PHT): 4.93 cm     TR Peak grad:   16.8 mmHg MV Decel Time: 154 msec     TR Vmax:  205.00 cm/s MV E velocity: 111.00 cm/s                             SHUNTS                             Systemic VTI:  0.09 m                             Systemic Diam: 2.10 cm                             Pulmonic VTI:  0.130 m Donnelly Angelica Electronically signed by Donnelly Angelica Signature Date/Time: 11/01/2021/12:52:46 PM    Final    [place] LT UPPER EXTREM LTD SOFT TISSUE NON VASCULAR  Result Date: 11/01/2021 CLINICAL DATA:  Left forearm bruise status post fall 1 week ago EXAM: ULTRASOUND LEFT UPPER EXTREMITY LIMITED TECHNIQUE: Ultrasound examination of the upper extremity soft tissues was performed in the area of clinical concern. COMPARISON:  None. FINDINGS: Targeted sonographic evaluation of the left forearm demonstrates a 1.5 x 0.6 x 1.0 cm complex fluid collection which is likely a hematoma. IMPRESSION: 1.5 x 0.6 x 1.0 cm complex fluid collection in the left forearm is likely a small hematoma. If patient's symptoms worsen, repeat ultrasound evaluation should be performed. Electronically Signed   By: Miachel Roux M.D.   On: 11/01/2021 17:30   CT HEAD CODE STROKE WO CONTRAST`  Result Date: 11/01/2021 CLINICAL DATA:  Code  stroke.  Facial droop and slurred speech EXAM: CT HEAD WITHOUT CONTRAST TECHNIQUE: Contiguous axial images were obtained from the base of the skull through the vertex without intravenous contrast. COMPARISON:  01/07/2015 FINDINGS: Brain: No evidence of acute infarction, hemorrhage, hydrocephalus, extra-axial collection or mass lesion/mass effect. Vascular: No hyperdense vessel or unexpected calcification. Skull: Normal. Negative for fracture or focal lesion. Sinuses/Orbits: No acute finding. Other: These results were communicated to Dr Stark Klein at 6:03 am on 11/01/2021 by text page via the [facility] messaging system. ASPECTS [facility] Stroke Program Early CT Score) - Ganglionic level infarction (caudate, lentiform nuclei, internal capsule, insula, M1-M3 cortex): 7 - Supraganglionic infarction (M4-M6 cortex): 3 Total score (0-10 with 10 being normal): 10 IMPRESSION: No acute finding.  No hemorrhage or visible infarct. Electronically Signed   By: Jorje Guild M.D.   On: 11/01/2021 06:05    Microbiology: Recent Results (from the past 240 hour(s))  Blood Culture (routine x 2)     Status: None   Collection Time: 10/31/21  8:08 PM   Specimen: BLOOD  Result Value Ref Range Status   Specimen Description BLOOD LEFT ANTECUBITAL  Final   Special Requests   Final    BOTTLES DRAWN AEROBIC AND ANAEROBIC Blood Culture adequate volume   Culture   Final    NO GROWTH 6 DAYS Performed at [facility], [address] [place], [place] [address]    Report Status 11/06/2021 FINAL  Final  Blood Culture (routine x 2)     Status: None   Collection Time: 10/31/21  8:13 PM   Specimen: BLOOD  Result Value Ref Range Status   Specimen Description BLOOD BLOOD LEFT FOREARM  Final   Special Requests   Final    BOTTLES DRAWN AEROBIC AND ANAEROBIC Blood Culture adequate volume   Culture  Final    NO GROWTH 6 DAYS Performed at [facility], [address] [place], [place] [address]    Report Status 11/06/2021  FINAL  Final  Resp Panel by RT-PCR (Flu A&B, Covid) Nasopharyngeal Swab     Status: None   Collection Time: 10/31/21  8:28 PM   Specimen: Nasopharyngeal Swab; Nasopharyngeal(NP) swabs in vial transport medium  Result Value Ref Range Status   SARS Coronavirus 2 by RT PCR NEGATIVE NEGATIVE Final    Comment: (NOTE) SARS-CoV-2 target nucleic acids are NOT DETECTED.  The SARS-CoV-2 RNA is generally detectable in upper respiratory specimens during the acute phase of infection. The lowest concentration of SARS-CoV-2 viral copies this assay can detect is 138 copies/mL. A negative result does not preclude SARS-Cov-2 infection and should not be used as the sole basis for treatment or other patient management decisions. A negative result may occur with  improper specimen collection/handling, submission of specimen other than nasopharyngeal swab, presence of viral mutation(s) within the areas targeted by this assay, and inadequate number of viral copies(<138 copies/mL). A negative result must be combined with clinical observations, patient history, and epidemiological information. The expected result is Negative.  Fact Sheet for Patients:  EntrepreneurPulse.com.au  Fact Sheet for Healthcare Providers:  IncredibleEmployment.be  This test is no t yet approved or cleared by the [place] FDA and  has been authorized for detection and/or diagnosis of SARS-CoV-2 by FDA under an Emergency Use Authorization (EUA). This EUA will remain  in effect (meaning this test can be used) for the duration of the COVID-19 declaration under Section 564(b)(1) of the Act, 21 U.S.C.section 360bbb-3(b)(1), unless the authorization is terminated  or revoked sooner.       Influenza A by PCR NEGATIVE NEGATIVE Final   Influenza B by PCR NEGATIVE NEGATIVE Final    Comment: (NOTE) The Xpert Xpress SARS-CoV-2/FLU/RSV plus assay is intended as an aid in the diagnosis of influenza  from Nasopharyngeal swab specimens and should not be used as a sole basis for treatment. Nasal washings and aspirates are unacceptable for Xpert Xpress SARS-CoV-2/FLU/RSV testing.  Fact Sheet for Patients: EntrepreneurPulse.com.au  Fact Sheet for Healthcare Providers: IncredibleEmployment.be  This test is not yet approved or cleared by the [place] FDA and has been authorized for detection and/or diagnosis of SARS-CoV-2 by FDA under an Emergency Use Authorization (EUA). This EUA will remain in effect (meaning this test can be used) for the duration of the COVID-19 declaration under Section 564(b)(1) of the Act, 21 U.S.C. section 360bbb-3(b)(1), unless the authorization is terminated or revoked.  Performed at [facility], [address] [place], [place] [address]   MRSA Next Gen by PCR, Nasal     Status: None   Collection Time: 10/31/21 11:59 PM   Specimen: Nasal Mucosa; Nasal Swab  Result Value Ref Range Status   MRSA by PCR Next Gen NOT DETECTED NOT DETECTED Final    Comment: (NOTE) The GeneXpert MRSA Assay (FDA approved for NASAL specimens only), is one component of a comprehensive MRSA colonization surveillance program. It is not intended to diagnose MRSA infection nor to guide or monitor treatment for MRSA infections. Test performance is not FDA approved in patients less than 63 years old. Performed at [facility], [address], [place], [place]  [address]   Urine Culture     Status: Abnormal   Collection Time: 11/01/21  1:00 AM   Specimen: Urine, Random  Result Value Ref Range Status   Specimen Description  Final    URINE, RANDOM Performed at [facility], [address] [place], [place] [address]    Special Requests   Final    NONE Performed at [facility], [address] [place], [place] [address]    Culture (A)  Final    70,000 COLONIES/mL KLEBSIELLA OXYTOCA 50,000  COLONIES/mL ESCHERICHIA COLI    Report Status 11/04/2021 FINAL  Final   Organism ID, Bacteria KLEBSIELLA OXYTOCA (A)  Final   Organism ID, Bacteria ESCHERICHIA COLI (A)  Final      Susceptibility   Escherichia coli - MIC*    AMPICILLIN 4 SENSITIVE Sensitive     CEFAZOLIN <=4 SENSITIVE Sensitive     CEFEPIME <=0.12 SENSITIVE Sensitive     CEFTRIAXONE <=0.25 SENSITIVE Sensitive     CIPROFLOXACIN >=4 RESISTANT Resistant     GENTAMICIN <=1 SENSITIVE Sensitive     IMIPENEM <=0.25 SENSITIVE Sensitive     NITROFURANTOIN <=16 SENSITIVE Sensitive     TRIMETH/SULFA <=20 SENSITIVE Sensitive     AMPICILLIN/SULBACTAM <=2 SENSITIVE Sensitive     PIP/TAZO <=4 SENSITIVE Sensitive     * 50,000 COLONIES/mL ESCHERICHIA COLI   Klebsiella oxytoca - MIC*    AMPICILLIN >=32 RESISTANT Resistant     CEFAZOLIN 8 SENSITIVE Sensitive     CEFEPIME <=0.12 SENSITIVE Sensitive     CEFTRIAXONE <=0.25 SENSITIVE Sensitive     CIPROFLOXACIN <=0.25 SENSITIVE Sensitive     GENTAMICIN <=1 SENSITIVE Sensitive     IMIPENEM <=0.25 SENSITIVE Sensitive     NITROFURANTOIN 32 SENSITIVE Sensitive     TRIMETH/SULFA <=20 SENSITIVE Sensitive     AMPICILLIN/SULBACTAM 16 INTERMEDIATE Intermediate     PIP/TAZO <=4 SENSITIVE Sensitive     * 70,000 COLONIES/mL KLEBSIELLA OXYTOCA     Labs: Basic Metabolic Panel: Recent Labs  Lab 11/02/21 0342 11/03/21 0348 11/04/21 0400 11/05/21 0445 11/06/21 0501 11/07/21 0519 11/08/21 0630  NA 142 143 139 143 140 141 139  K 3.4* 4.0 4.0 3.6 3.8 4.0 3.9  CL 103 104 102 98 98 99 95*  CO2 30 28 31  35* 35* 36* 35*  GLUCOSE 78 123* 125* 100* 102* 107* 93  BUN 46* 52* 66* 58* 59* 52* 47*  CREATININE 2.29* 2.21* 2.17* 1.81* 1.81* 1.70* 1.59*  CALCIUM 8.8* 8.8* 8.9 8.8* 8.7* 8.9 8.7*  MG 2.0 2.0 2.1 2.1 2.1  --   --   PHOS 4.5 4.1 2.6 2.6  --   --   --    Liver Function Tests: No results for input(s): AST, ALT, ALKPHOS, BILITOT, PROT, ALBUMIN in the last 168 hours. No results for  input(s): LIPASE, AMYLASE in the last 168 hours. No results for input(s): AMMONIA in the last 168 hours. CBC: Recent Labs  Lab 11/03/21 0348 11/04/21 0400 11/07/21 0519  WBC 5.7 9.2 3.6*  HGB 13.8 13.1 14.0  HCT 45.1 42.9 46.0  MCV 94.4 94.3 93.9  PLT 142* 170 132*   Cardiac Enzymes: No results for input(s): CKTOTAL, CKMB, CKMBINDEX, TROPONINI in the last 168 hours. BNP: BNP (last 3 results) Recent Labs    10/31/21 2146 11/03/21 0348 11/05/21 0445  BNP >4,500.0* >4,500.0* 3,695.0*    ProBNP (last 3 results) No results for input(s): PROBNP in the last 8760 hours.  CBG: Recent Labs  Lab 11/01/21 1726 11/08/21 0848 11/08/21 1200  GLUCAP 98 95 83       Signed:  Desma Maxim MD.  [facility] 11/08/2021, 12:47 PM

## 2021-11-08 NOTE — Plan of Care (Signed)
  Problem: Clinical Measurements: Goal: Ability to maintain clinical measurements within normal limits will improve Outcome: Adequate for Discharge Goal: Will remain free from infection Outcome: Adequate for Discharge Goal: Diagnostic test results will improve Outcome: Adequate for Discharge Goal: Respiratory complications will improve Outcome: Adequate for Discharge Goal: Cardiovascular complication will be avoided Outcome: Adequate for Discharge   

## 2021-11-08 NOTE — Progress Notes (Signed)
Nutrition Follow-up  DOCUMENTATION CODES:   Not applicable  INTERVENTION:   -Decrease Ensure Enlive po to BID, each supplement provides 350 kcal and 20 grams of protein  -Continue MVI with minerals daily  NUTRITION DIAGNOSIS:   Inadequate oral intake related to acute illness as evidenced by NPO status.  Progressing; advanced to PO diet on 11/03/21  GOAL:   Patient will meet greater than or equal to 90% of their needs  Progressing   MONITOR:   Labs, Weight trends, Skin, I & O's, Diet advancement  REASON FOR ASSESSMENT:   Malnutrition Screening Tool    ASSESSMENT:   81 y.o male  with significant PMH of DVT, ischemic colitis, NICM, CKD, chronic anticoagulation, COVID-19, CHF, NYHA class 2, essential hypertension, ICD, atrial fibrillation, benign prostatic hyperplasia with lower urinary tract symptoms who presented to the ED with chief complaints of progressive shortness of breath.  12/31- s/p BSE- advanced to dysphagia 1 diet with nectar thick liquids 1/2- advanced to dysphagia 3 diet with thin liquids 1/4- transitioned from IV to PO diuretics  Reviewed I/O's: -1.8 L x 24 hours and -8.4 L since admission  UOP: 2.4 L x 24 hours   Pt remains with good appetite. Noted meal completion 100%. Pt consuming 1-2 Ensure supplements per day.   Noted mild wt gain since admission; pt current diuresing.   Medications reviewed and include demadex.   Labs reviewed: CBGS: 95.  Diet Order:   Diet Order             DIET DYS 3 Room service appropriate? Yes; Fluid consistency: Thin; Fluid restriction: 1200 mL Fluid  Diet effective now                   EDUCATION NEEDS:   Education needs have been addressed  Skin:  Skin Assessment: Reviewed RN Assessment  Last BM:  11/07/20  Height:   Ht Readings from Last 1 Encounters:  11/01/21 5\' 8"  (1.727 m)    Weight:   Wt Readings from Last 1 Encounters:  11/07/21 83.1 kg    Ideal Body Weight:  70 kg  BMI:  Body mass  index is 27.86 kg/m.  Estimated Nutritional Needs:   Kcal:  1900-2200kcal/day  Protein:  95-110g/day  Fluid:  1.8-2.1L/day    Loistine Chance, RD, LDN, [place] Registered Dietitian II Certified Diabetes Care and Education Specialist Please refer to AMION for RD and/or RD on-call/weekend/after hours pager

## 2021-11-08 NOTE — Consult Note (Signed)
ANTICOAGULATION CONSULT NOTE  Pharmacy Consult for Warfarin  Indication: atrial fibrillation and LV thrombus  Patient Measurements: Height: 5\' 8"  (172.7 cm) Weight: 83.1 kg (183 lb 3.2 oz) IBW/kg (Calculated) : 68.4  Labs: Recent Labs    11/06/21 0501 11/07/21 0519 11/08/21 0630  HGB  --  14.0  --   HCT  --  46.0  --   PLT  --  132*  --   LABPROT 22.9* 20.6* 19.6*  INR 2.0* 1.8* 1.7*  CREATININE 1.81* 1.70* 1.59*     Estimated Creatinine Clearance: 40.2 mL/min (A) (by C-G formula based on SCr of 1.59 mg/dL (H)).   Medical History: Past Medical History:  Diagnosis Date   Acute urinary retention 03/05/2021   Anxiety    CHF (congestive heart failure) ([place])    Claustrophobia    Colitis, ischemic ([place]) 2010   developed DVT   DVT (deep venous thrombosis) ([place]) 2010   stomach and leg   Dysrhythmia    A-Fib   History of 2019 novel coronavirus disease (COVID-19) 11/22/2019   Hypertension      Assessment: 81yo male with PMH of DVT, Ischemic Colitis, NICM (nonischemic cardiomyopathy), AKI (acute kidney injury), Chronic anticoagulation, Chronic systolic CHF, NYHA class 2, Essential hypertension, ICD in place, Atrial fibrillation, Benign prostatic hyperplasia who was admitted to the hospital for SOB. Pharmacy was consulted for warfarin dosing and monitoring.  PTA regimen: 2mg  daily (TWD=14 mg) (confirmed with PCP)  Last dose: 12/27  Baseline DDI amiodarone (home medication)  Date/time INR Warfarin Dose 12/28   3.9 HOLD 12/29   3.9 HOLD 12/30   3.5 HOLD 12/31   4.1 HOLD 1/1  4.3 HOLD 1/2  3.3 Warfarin 2 mg 1/3  2.0 Warfarin 2 mg 1/4  1.8 Warfarin 2 mg 1/5  1.7 Warfarin 4mg  x 1 dose  Goal of Therapy:  INR 2-3 Monitor platelets by anticoagulation protocol: Yes   Plan:  --INR remains subtherapeutic and continues to trend downward. --Anticipate several days for warfarin effect given held doses --Will order warfarin 4mg  x 1 as booster dose --Daily INR per  protocol --CBC at least every 3 days per protocol (last CDC done 1/4)  Colston Pyle Rodriguez-Guzman PharmD, BCPS 11/08/2021 9:41 AM

## 2021-11-13 ENCOUNTER — Ambulatory Visit (INDEPENDENT_AMBULATORY_CARE_PROVIDER_SITE_OTHER): Payer: HMO | Admitting: Urology

## 2021-11-13 ENCOUNTER — Other Ambulatory Visit: Payer: Self-pay

## 2021-11-13 ENCOUNTER — Encounter: Payer: Self-pay | Admitting: Urology

## 2021-11-13 DIAGNOSIS — R339 Retention of urine, unspecified: Secondary | ICD-10-CM

## 2021-11-13 LAB — BLADDER SCAN AMB NON-IMAGING: Scan Result: >485

## 2021-11-13 NOTE — Progress Notes (Signed)
11/13/2021 2:03 PM   Adrian Chavez 11/01/1941 [id number]  Referring provider: Dion Body, MD [place] [facility] [place],  [place] [address]  Chief Complaint  Patient presents with   Urinary Retention   Urological history: 1. Urinary retention -presented to ED on 03/06/2021 -Foley placed for 2 L return -failed TOV on 03/19/2021 -cysto 04/2021-Prominent lateral lobe enlargement prostate  - Moderate elevation bladder neck - Moderate to severe trabeculation-scattered cellules diverticula   2. BPH  -prostate volume 111 cc on recent CT -appointment in February to discuss Rezum    3. Elevated PSA -PSA 11.8 in 2017 -per patient prostate biopsy with inconclusive results ~ 15 years ago   4. High risk hematuria -former smoker -Dr. Jacqlyn Larsen in 2014 for gross hematuria and CT urogram/cystoscopy recommended however this apparently was never scheduled or performed -reports of gross heme -CT renal stone 2022 - No urolithiasis. No hydronephrosis -RUS 2022 - No hydronephrosis or hydroureter to indicate obstruction contributing to the patient's current renal insufficiency. Renal echogenicity is within normal limits. Low-level perirenal stranding/fluid, as shown on recent CT. -cysto 04/2021 -  inflammatory changes bladder base most likely secondary to indwelling Foley- urine cytology negative -Continues to have episodes with gross hematuria  HPI: Adrian Chavez is a 81 y.o. male who presents today no urine returned with self-catheterization.  Patient with massive BPH and urinary retention awaiting appointment in February to be evaluated for possible Rezum procedure on his prostate who stated he is having no urine return with self catheterization.  Unfortunately he had been recently admitted for cardiogenic shock, so a possible Rezum procedure may not be an option.  He was able to self cath this morning.    PVR > 485 mL    PMH: Past Medical History:  Diagnosis  Date   Acute urinary retention 03/05/2021   Anxiety    CHF (congestive heart failure) ([facility])    Claustrophobia    Colitis, ischemic ([place]) 2010   developed DVT   DVT (deep venous thrombosis) ([place]) 2010   stomach and leg   Dysrhythmia    A-Fib   History of 2019 novel coronavirus disease (COVID-19) 11/22/2019   Hypertension     Surgical History: Past Surgical History:  Procedure Laterality Date   HERNIA REPAIR Right 2007   IMPLANTABLE CARDIOVERTER DEFIBRILLATOR (ICD) GENERATOR CHANGE Left 02/21/2016   Procedure: ICD GENERATOR CHANGE;  Surgeon: Isaias Cowman, MD;  Location: [facility] ORS;  Service: Cardiovascular;  Laterality: Left;   PACEMAKER INSERTION     PITUITARY SURGERY  2002   Done at [facility] Medications:  Allergies as of 11/13/2021       Reactions   Escitalopram Oxalate Nausea Only   Oxycodone    vomiting        Medication List        Accurate as of November 13, 2021  2:03 PM. If you have any questions, ask your nurse or doctor.          albuterol 108 (90 Base) MCG/ACT inhaler Commonly known as: VENTOLIN HFA SMARTSIG:1-2 Inhalation Via Inhaler Every 4 Hours PRN   ALPRAZolam 0.5 MG tablet Commonly known as: XANAX Take 0.5 mg by mouth daily as needed for anxiety.   amiodarone 200 MG tablet Commonly known as: PACERONE Take 1 tablet (200 mg total) by mouth daily.   carvedilol 3.125 MG tablet Commonly known as: COREG Take 3.125 mg by mouth 2 (two) times daily.   cetirizine 10  MG tablet Commonly known as: ZYRTEC Take 1 tablet by mouth daily.   Cialis 5 MG tablet Generic drug: tadalafil Take 5 mg by mouth daily.   finasteride 5 MG tablet Commonly known as: Proscar Take 1 tablet (5 mg total) by mouth daily.   fluticasone 50 MCG/ACT nasal spray Commonly known as: FLONASE Place 2 sprays into both nostrils daily as needed for allergies or rhinitis.   potassium chloride SA 20 MEQ tablet Commonly known as: KLOR-CON M Take 20 mEq by mouth 2 (two)  times daily.   sertraline 25 MG tablet Commonly known as: ZOLOFT Take 25 mg by mouth daily.   torsemide 20 MG tablet Commonly known as: DEMADEX Take 20 mg by mouth 2 (two) times daily.   vitamin B-12 1000 MCG tablet Commonly known as: CYANOCOBALAMIN Take 1,000 mcg by mouth daily.   warfarin 1 MG tablet Commonly known as: COUMADIN Take 2 mg by mouth See admin instructions. Take 2 MG by mouth every day        Allergies:  Allergies  Allergen Reactions   Escitalopram Oxalate Nausea Only   Oxycodone     vomiting    Family History: No family history on file.  Social History:  reports that he quit smoking about 47 years ago. His smoking use included cigarettes. He has never used smokeless tobacco. He reports that he does not drink alcohol and does not use drugs.  ROS: Pertinent ROS in HPI  Physical Exam: Constitutional: Cachectic and frail-appearing  HEENT: [place] AT, mask in place.  Trachea midline Cardiovascular: No clubbing, cyanosis, or edema. Respiratory: Normal respiratory effort, no increased work of breathing. GU: No CVA tenderness.  No bladder fullness or masses.  Patient with uncircumcised phallus. Foreskin easily retracted.  Penile and foreskin edema is noted.  Urethral meatus is patent.  No penile discharge. No penile lesions or rashes. Scrotum with mild edema.  Neurologic: Grossly intact, no focal deficits, moving all 4 extremities. Psychiatric: Normal mood and affect.   Laboratory Data: BMP Latest Ref Rng & Units 11/08/2021 11/07/2021 11/06/2021  Glucose 70 - 99 mg/dL 93 107(H) 102(H)  BUN 8 - 23 mg/dL 47(H) 52(H) 59(H)  Creatinine 0.61 - 1.24 mg/dL 1.59(H) 1.70(H) 1.81(H)  Sodium 135 - 145 mmol/L 139 141 140  Potassium 3.5 - 5.1 mmol/L 3.9 4.0 3.8  Chloride 98 - 111 mmol/L 95(L) 99 98  CO2 22 - 32 mmol/L 35(H) 36(H) 35(H)  Calcium 8.9 - 10.3 mg/dL 8.7(L) 8.9 8.7(L)    CBC    Component Value Date/Time   WBC 3.6 (L) 11/07/2021 0519   RBC 4.90 11/07/2021 0519    HGB 14.0 11/07/2021 0519   HGB 14.7 04/07/2012 1530   HCT 46.0 11/07/2021 0519   HCT 45.7 04/07/2012 1530   PLT 132 (L) 11/07/2021 0519   PLT 201 04/07/2012 1530   MCV 93.9 11/07/2021 0519   MCV 83 04/07/2012 1530   MCH 28.6 11/07/2021 0519   MCHC 30.4 11/07/2021 0519   RDW 17.2 (H) 11/07/2021 0519   RDW 17.0 (H) 04/07/2012 1530   LYMPHSABS 0.9 10/31/2021 2008   MONOABS 0.7 10/31/2021 2008   EOSABS 0.2 10/31/2021 2008   BASOSABS 0.1 10/31/2021 2008  I have reviewed the labs.   Pertinent Imaging:  11/13/21 14:02  Scan Result >485 mL    Simple Catheter Placement Due to urinary retention patient is present today for a foley cath placement.  Patient was cleaned and prepped in a sterile fashion with betadine. A 18  FR coude foley catheter was inserted, urine return was noted  400 ml, urine was dark yellow in color.  The balloon was filled with 10cc of sterile water.  Foreskin was pulled over the head of penis.  A leg bag was attached for drainage. Patient was also given a night bag to take home and was given instruction on how to change from one bag to another.  Patient was given instruction on proper catheter care.  Patient tolerated well, no complications were noted    Assessment & Plan:    1. Urinary retention -secondary to BPH -continue finasteride 5 mg daily  -has an appointment on 12/20/2021 with Dr. Mal[place] Michael Gridley for evaluation for a possible Rezum procedure  -He likely had difficulty self cathing due to the edematous foreskin making it difficult to pinpoint the urethral meatus -We will leave indwelling Foley for 1 week to allow the penile swelling to diminish  Return in about 1 week (around 11/20/2021) for TOV and PVR .  These notes generated with voice recognition software. I apologize for typographical errors.  Zara Council, PA-C  [facility] [address]  [place], [place] [address] [phone]   I spent 30 minutes  on the day of the encounter to include pre-visit record review, face-to-face time with the patient, and post-visit ordering of tests.

## 2021-11-15 ENCOUNTER — Ambulatory Visit: Payer: HMO | Admitting: Family

## 2021-11-20 ENCOUNTER — Other Ambulatory Visit: Payer: Self-pay

## 2021-11-20 ENCOUNTER — Ambulatory Visit (INDEPENDENT_AMBULATORY_CARE_PROVIDER_SITE_OTHER): Payer: HMO | Admitting: Urology

## 2021-11-20 ENCOUNTER — Ambulatory Visit: Payer: HMO | Admitting: *Deleted

## 2021-11-20 DIAGNOSIS — R339 Retention of urine, unspecified: Secondary | ICD-10-CM | POA: Diagnosis not present

## 2021-11-20 LAB — BLADDER SCAN AMB NON-IMAGING

## 2021-11-20 NOTE — Progress Notes (Signed)
Catheter Removal  Patient is present today for a catheter removal.  30ml of water was drained from the balloon. A 16FR foley cath was removed from the bladder no complications were noted . Patient tolerated well.  Performed by: Gaspar Cola CMA   Follow up/ Additional notes: Afternoon  f/u

## 2021-11-20 NOTE — Progress Notes (Signed)
11/20/2021 11:16 AM   Adrian Chavez [birth year] [id number]  Referring provider: Dion Body, MD [facility] [place],  [place] [address]  Chief Complaint  Patient presents with   Urinary Retention   Urological history: 1. Urinary retention -presented to ED on 03/06/2021 -Foley placed for 2 L return -failed TOV on 03/19/2021 -cysto 04/2021-Prominent lateral lobe enlargement prostate  - Moderate elevation bladder neck - Moderate to severe trabeculation-scattered cellules diverticula   2. BPH  -prostate volume 111 cc on recent CT -appointment in February to discuss Rezum    3. Elevated PSA -PSA 11.8 in 2017 -per patient prostate biopsy with inconclusive results ~ 15 years ago   4. High risk hematuria -former smoker -Dr. Jacqlyn Larsen in 2014 for gross hematuria and CT urogram/cystoscopy recommended however this apparently was never scheduled or performed -reports of gross heme -CT renal stone 2022 - No urolithiasis. No hydronephrosis -RUS 2022 - No hydronephrosis or hydroureter to indicate obstruction contributing to the patient's current renal insufficiency. Renal echogenicity is within normal limits. Low-level perirenal stranding/fluid, as shown on recent CT. -cysto 04/2021 -  inflammatory changes bladder base most likely secondary to indwelling Foley- urine cytology negative -Continues to have episodes with gross hematuria  HPI: Adrian Chavez is a 81 y.o. male who presents today trial of void with his son, Adrian Chavez.    His Foley catheter was removed this morning.  See procedure note dated today.  He is not had any issues today and has been urinating.  He states he feels stronger.  His PVR is 322 mL.  PMH: Past Medical History:  Diagnosis Date   Acute urinary retention 03/05/2021   Anxiety    CHF (congestive heart failure) (HCC)    Claustrophobia    Colitis, ischemic ([place]) 2010   developed DVT   DVT (deep venous thrombosis) ([place]) 2010    stomach and leg   Dysrhythmia    A-Fib   History of 2019 novel coronavirus disease (COVID-19) 11/22/2019   Hypertension     Surgical History: Past Surgical History:  Procedure Laterality Date   HERNIA REPAIR Right 2007   IMPLANTABLE CARDIOVERTER DEFIBRILLATOR (ICD) GENERATOR CHANGE Left 02/21/2016   Procedure: ICD GENERATOR CHANGE;  Surgeon: Isaias Cowman, MD;  Location: [facility] ORS;  Service: Cardiovascular;  Laterality: Left;   PACEMAKER INSERTION     PITUITARY SURGERY  2002   [facility] at [facility] Medications:  Allergies as of 11/20/2021       Reactions   Escitalopram Oxalate Nausea Only   Oxycodone    vomiting        Medication List        Accurate as of November 20, 2021 11:59 PM. If you have any questions, ask your nurse or doctor.          albuterol 108 (90 Base) MCG/ACT inhaler Commonly known as: VENTOLIN HFA SMARTSIG:1-2 Inhalation Via Inhaler Every 4 Hours PRN   ALPRAZolam 0.5 MG tablet Commonly known as: XANAX Take 0.5 mg by mouth daily as needed for anxiety.   amiodarone 200 MG tablet Commonly known as: PACERONE Take 1 tablet (200 mg total) by mouth daily.   carvedilol 3.125 MG tablet Commonly known as: COREG Take 3.125 mg by mouth 2 (two) times daily.   cetirizine 10 MG tablet Commonly known as: ZYRTEC Take 1 tablet by mouth daily.   Cialis 5 MG tablet Generic drug: tadalafil Take 5 mg by mouth daily.   finasteride  5 MG tablet Commonly known as: Proscar Take 1 tablet (5 mg total) by mouth daily.   fluticasone 50 MCG/ACT nasal spray Commonly known as: FLONASE Place 2 sprays into both nostrils daily as needed for allergies or rhinitis.   potassium chloride SA 20 MEQ tablet Commonly known as: KLOR-CON M Take 20 mEq by mouth 2 (two) times daily.   sertraline 25 MG tablet Commonly known as: ZOLOFT Take 25 mg by mouth daily.   torsemide 20 MG tablet Commonly known as: DEMADEX Take 20 mg by mouth 2 (two) times daily.   vitamin  B-12 1000 MCG tablet Commonly known as: CYANOCOBALAMIN Take 1,000 mcg by mouth daily.   warfarin 1 MG tablet Commonly known as: COUMADIN Take 2 mg by mouth See admin instructions. Take 2 MG by mouth every day        Allergies:  Allergies  Allergen Reactions   Escitalopram Oxalate Nausea Only   Oxycodone     vomiting    Family History: No family history on file.  Social History:  reports that he quit smoking about 47 years ago. His smoking use included cigarettes. He has never used smokeless tobacco. He reports that he does not drink alcohol and does not use drugs.  ROS: Pertinent ROS in HPI  Physical Exam: Constitutional:  Well nourished. Alert and oriented, No acute distress. HEENT: [place] AT, mask in place.  Trachea midline Cardiovascular: No clubbing, cyanosis, or edema. Respiratory: Normal respiratory effort, no increased work of breathing. Neurologic: Grossly intact, no focal deficits, moving all 4 extremities. Psychiatric: Normal mood and affect.   Laboratory Data: N/A  Pertinent Imaging:  11/20/21 16:21  Scan Result 339mL    Assessment & Plan:    1. Urinary retention -secondary to BPH -continue finasteride 5 mg daily  -has an appointment on 12/20/2021 with Dr. Mal[place] Michael Gridley for evaluation for a possible Rezum procedure  -He would like to return to self cathing as he feels the strength is returned -He states he has enough catheter supply and does not need a refill at this time  Return in about 1 month (around 12/21/2021) for IPSS and PVR.  These notes generated with voice recognition software. I apologize for typographical errors.  Zara Council, PA-C  [facility] [address]  [place], [place] [address] [phone]

## 2021-11-21 ENCOUNTER — Encounter: Payer: Self-pay | Admitting: Urology

## 2021-11-23 ENCOUNTER — Inpatient Hospital Stay
Admission: EM | Admit: 2021-11-23 | Discharge: 2021-11-28 | DRG: 813 | Disposition: A | Discharge status: Hospice | Payer: HMO | Attending: Internal Medicine | Admitting: Internal Medicine

## 2021-11-23 ENCOUNTER — Other Ambulatory Visit: Payer: Self-pay

## 2021-11-23 ENCOUNTER — Emergency Department: Payer: HMO

## 2021-11-23 DIAGNOSIS — Z95810 Presence of automatic (implantable) cardiac defibrillator: Secondary | ICD-10-CM

## 2021-11-23 DIAGNOSIS — N179 Acute kidney failure, unspecified: Secondary | ICD-10-CM | POA: Diagnosis present

## 2021-11-23 DIAGNOSIS — S3730XA Unspecified injury of urethra, initial encounter: Secondary | ICD-10-CM | POA: Diagnosis present

## 2021-11-23 DIAGNOSIS — B965 Pseudomonas (aeruginosa) (mallei) (pseudomallei) as the cause of diseases classified elsewhere: Secondary | ICD-10-CM | POA: Diagnosis present

## 2021-11-23 DIAGNOSIS — Z66 Do not resuscitate: Secondary | ICD-10-CM | POA: Diagnosis present

## 2021-11-23 DIAGNOSIS — Z8616 Personal history of COVID-19: Secondary | ICD-10-CM

## 2021-11-23 DIAGNOSIS — I272 Pulmonary hypertension, unspecified: Secondary | ICD-10-CM | POA: Diagnosis present

## 2021-11-23 DIAGNOSIS — J9622 Acute and chronic respiratory failure with hypercapnia: Secondary | ICD-10-CM | POA: Diagnosis present

## 2021-11-23 DIAGNOSIS — N189 Chronic kidney disease, unspecified: Secondary | ICD-10-CM | POA: Diagnosis not present

## 2021-11-23 DIAGNOSIS — R310 Gross hematuria: Secondary | ICD-10-CM | POA: Diagnosis present

## 2021-11-23 DIAGNOSIS — J9621 Acute and chronic respiratory failure with hypoxia: Secondary | ICD-10-CM | POA: Diagnosis present

## 2021-11-23 DIAGNOSIS — D500 Iron deficiency anemia secondary to blood loss (chronic): Secondary | ICD-10-CM | POA: Diagnosis not present

## 2021-11-23 DIAGNOSIS — I959 Hypotension, unspecified: Secondary | ICD-10-CM | POA: Diagnosis present

## 2021-11-23 DIAGNOSIS — T45515A Adverse effect of anticoagulants, initial encounter: Secondary | ICD-10-CM | POA: Diagnosis present

## 2021-11-23 DIAGNOSIS — Z7189 Other specified counseling: Secondary | ICD-10-CM | POA: Diagnosis not present

## 2021-11-23 DIAGNOSIS — Z515 Encounter for palliative care: Secondary | ICD-10-CM

## 2021-11-23 DIAGNOSIS — I480 Paroxysmal atrial fibrillation: Secondary | ICD-10-CM | POA: Diagnosis present

## 2021-11-23 DIAGNOSIS — T83518A Infection and inflammatory reaction due to other urinary catheter, initial encounter: Secondary | ICD-10-CM | POA: Diagnosis present

## 2021-11-23 DIAGNOSIS — R339 Retention of urine, unspecified: Secondary | ICD-10-CM | POA: Diagnosis not present

## 2021-11-23 DIAGNOSIS — K761 Chronic passive congestion of liver: Secondary | ICD-10-CM | POA: Diagnosis present

## 2021-11-23 DIAGNOSIS — T8383XA Hemorrhage of genitourinary prosthetic devices, implants and grafts, initial encounter: Secondary | ICD-10-CM | POA: Diagnosis present

## 2021-11-23 DIAGNOSIS — D62 Acute posthemorrhagic anemia: Secondary | ICD-10-CM

## 2021-11-23 DIAGNOSIS — D6832 Hemorrhagic disorder due to extrinsic circulating anticoagulants: Principal | ICD-10-CM | POA: Diagnosis present

## 2021-11-23 DIAGNOSIS — N401 Enlarged prostate with lower urinary tract symptoms: Secondary | ICD-10-CM | POA: Diagnosis present

## 2021-11-23 DIAGNOSIS — G4733 Obstructive sleep apnea (adult) (pediatric): Secondary | ICD-10-CM | POA: Diagnosis present

## 2021-11-23 DIAGNOSIS — R319 Hematuria, unspecified: Secondary | ICD-10-CM | POA: Diagnosis not present

## 2021-11-23 DIAGNOSIS — E876 Hypokalemia: Secondary | ICD-10-CM | POA: Diagnosis present

## 2021-11-23 DIAGNOSIS — Z87891 Personal history of nicotine dependence: Secondary | ICD-10-CM

## 2021-11-23 DIAGNOSIS — N1832 Chronic kidney disease, stage 3b: Secondary | ICD-10-CM | POA: Diagnosis present

## 2021-11-23 DIAGNOSIS — Z888 Allergy status to other drugs, medicaments and biological substances status: Secondary | ICD-10-CM

## 2021-11-23 DIAGNOSIS — Z789 Other specified health status: Secondary | ICD-10-CM

## 2021-11-23 DIAGNOSIS — I10 Essential (primary) hypertension: Secondary | ICD-10-CM | POA: Diagnosis present

## 2021-11-23 DIAGNOSIS — B961 Klebsiella pneumoniae [K. pneumoniae] as the cause of diseases classified elsewhere: Secondary | ICD-10-CM | POA: Diagnosis present

## 2021-11-23 DIAGNOSIS — Z79899 Other long term (current) drug therapy: Secondary | ICD-10-CM

## 2021-11-23 DIAGNOSIS — R338 Other retention of urine: Secondary | ICD-10-CM | POA: Diagnosis present

## 2021-11-23 DIAGNOSIS — I130 Hypertensive heart and chronic kidney disease with heart failure and stage 1 through stage 4 chronic kidney disease, or unspecified chronic kidney disease: Secondary | ICD-10-CM | POA: Diagnosis present

## 2021-11-23 DIAGNOSIS — N138 Other obstructive and reflux uropathy: Secondary | ICD-10-CM | POA: Diagnosis present

## 2021-11-23 DIAGNOSIS — I428 Other cardiomyopathies: Secondary | ICD-10-CM | POA: Diagnosis present

## 2021-11-23 DIAGNOSIS — Y731 Therapeutic (nonsurgical) and rehabilitative gastroenterology and urology devices associated with adverse incidents: Secondary | ICD-10-CM | POA: Diagnosis present

## 2021-11-23 DIAGNOSIS — N200 Calculus of kidney: Secondary | ICD-10-CM | POA: Diagnosis present

## 2021-11-23 DIAGNOSIS — Z86718 Personal history of other venous thrombosis and embolism: Secondary | ICD-10-CM

## 2021-11-23 DIAGNOSIS — Y658 Other specified misadventures during surgical and medical care: Secondary | ICD-10-CM | POA: Diagnosis present

## 2021-11-23 DIAGNOSIS — I5042 Chronic combined systolic (congestive) and diastolic (congestive) heart failure: Secondary | ICD-10-CM | POA: Diagnosis present

## 2021-11-23 DIAGNOSIS — N368 Other specified disorders of urethra: Secondary | ICD-10-CM | POA: Diagnosis present

## 2021-11-23 DIAGNOSIS — J9611 Chronic respiratory failure with hypoxia: Secondary | ICD-10-CM | POA: Diagnosis present

## 2021-11-23 DIAGNOSIS — F419 Anxiety disorder, unspecified: Secondary | ICD-10-CM | POA: Diagnosis present

## 2021-11-23 DIAGNOSIS — Z885 Allergy status to narcotic agent status: Secondary | ICD-10-CM

## 2021-11-23 DIAGNOSIS — N3091 Cystitis, unspecified with hematuria: Secondary | ICD-10-CM

## 2021-11-23 DIAGNOSIS — Z20822 Contact with and (suspected) exposure to covid-19: Secondary | ICD-10-CM | POA: Diagnosis present

## 2021-11-23 DIAGNOSIS — N390 Urinary tract infection, site not specified: Secondary | ICD-10-CM | POA: Diagnosis not present

## 2021-11-23 DIAGNOSIS — R54 Age-related physical debility: Secondary | ICD-10-CM | POA: Diagnosis present

## 2021-11-23 DIAGNOSIS — Z7901 Long term (current) use of anticoagulants: Secondary | ICD-10-CM

## 2021-11-23 DIAGNOSIS — R791 Abnormal coagulation profile: Secondary | ICD-10-CM

## 2021-11-23 LAB — COMPREHENSIVE METABOLIC PANEL
ALT: 24 U/L (ref 0–44)
AST: 33 U/L (ref 15–41)
Albumin: 3.5 g/dL (ref 3.5–5.0)
Alkaline Phosphatase: 68 U/L (ref 38–126)
Anion gap: 11 (ref 5–15)
BUN: 46 mg/dL — ABNORMAL HIGH (ref 8–23)
CO2: 39 mmol/L — ABNORMAL HIGH (ref 22–32)
Calcium: 8.7 mg/dL — ABNORMAL LOW (ref 8.9–10.3)
Chloride: 83 mmol/L — ABNORMAL LOW (ref 98–111)
Creatinine, Ser: 2.22 mg/dL — ABNORMAL HIGH (ref 0.61–1.24)
GFR, Estimated: 30 mL/min — ABNORMAL LOW (ref >60)
Glucose, Bld: 110 mg/dL — ABNORMAL HIGH (ref 70–99)
Potassium: 2.7 mmol/L — CL (ref 3.5–5.1)
Sodium: 133 mmol/L — ABNORMAL LOW (ref 135–145)
Total Bilirubin: 3.9 mg/dL — ABNORMAL HIGH (ref 0.3–1.2)
Total Protein: 6.2 g/dL — ABNORMAL LOW (ref 6.5–8.1)

## 2021-11-23 LAB — URINALYSIS, ROUTINE W REFLEX MICROSCOPIC
RBC / HPF: >50 RBC/hpf — ABNORMAL HIGH (ref 0–5)
Squamous Epithelial / HPF: NONE SEEN (ref 0–5)
WBC, UA: >50 WBC/hpf — ABNORMAL HIGH (ref 0–5)

## 2021-11-23 LAB — CBC
HCT: 41.7 % (ref 39.0–52.0)
Hemoglobin: 12.8 g/dL — ABNORMAL LOW (ref 13.0–17.0)
MCH: 28.8 pg (ref 26.0–34.0)
MCHC: 30.7 g/dL (ref 30.0–36.0)
MCV: 93.7 fL (ref 80.0–100.0)
Platelets: 151 10*3/uL (ref 150–400)
RBC: 4.45 MIL/uL (ref 4.22–5.81)
RDW: 17.7 % — ABNORMAL HIGH (ref 11.5–15.5)
WBC: 5.2 10*3/uL (ref 4.0–10.5)
nRBC: 0 % (ref 0.0–0.2)

## 2021-11-23 LAB — RESP PANEL BY RT-PCR (FLU A&B, COVID) ARPGX2
Influenza A by PCR: NEGATIVE
Influenza B by PCR: NEGATIVE
SARS Coronavirus 2 by RT PCR: NEGATIVE

## 2021-11-23 LAB — PROTIME-INR
INR: 6 (ref 0.8–1.2)
Prothrombin Time: 53.2 seconds — ABNORMAL HIGH (ref 11.4–15.2)

## 2021-11-23 LAB — HEMOGLOBIN AND HEMATOCRIT, BLOOD
HCT: 33.6 % — ABNORMAL LOW (ref 39.0–52.0)
Hemoglobin: 10.6 g/dL — ABNORMAL LOW (ref 13.0–17.0)

## 2021-11-23 LAB — LIPASE, BLOOD: Lipase: 45 U/L (ref 11–51)

## 2021-11-23 MED ORDER — PHYTONADIONE 5 MG PO TABS
5.0000 mg | ORAL_TABLET | Freq: Once | ORAL | Status: AC
  Administered 2021-11-23: 5 mg via ORAL
  Filled 2021-11-23 (×2): qty 1

## 2021-11-23 MED ORDER — SODIUM CHLORIDE 0.9 % IV SOLN
1.0000 g | Freq: Once | INTRAVENOUS | Status: AC
  Administered 2021-11-23: 1 g via INTRAVENOUS
  Filled 2021-11-23: qty 10

## 2021-11-23 MED ORDER — POTASSIUM CHLORIDE CRYS ER 20 MEQ PO TBCR
40.0000 meq | EXTENDED_RELEASE_TABLET | Freq: Once | ORAL | Status: AC
  Administered 2021-11-23: 40 meq via ORAL
  Filled 2021-11-23: qty 2

## 2021-11-23 MED ORDER — SODIUM CHLORIDE 0.9 % IV BOLUS
250.0000 mL | Freq: Once | INTRAVENOUS | Status: AC
Start: 2021-11-23 — End: 2021-11-23
  Administered 2021-11-23: 250 mL via INTRAVENOUS

## 2021-11-23 MED ORDER — ONDANSETRON HCL 4 MG/2ML IJ SOLN: 4.0000 mg | Freq: Four times a day (QID) | INTRAMUSCULAR | Status: DC | PRN

## 2021-11-23 MED ORDER — LIDOCAINE HCL URETHRAL/MUCOSAL 2 % EX GEL
1.0000 "application " | Freq: Once | CUTANEOUS | Status: AC
  Administered 2021-11-23: 1 via URETHRAL
  Filled 2021-11-23: qty 6

## 2021-11-23 MED ORDER — AMIODARONE HCL 200 MG PO TABS
200.0000 mg | ORAL_TABLET | Freq: Every day | ORAL | Status: DC
  Administered 2021-11-23 – 2021-11-24 (×2): 200 mg via ORAL
  Filled 2021-11-23 (×2): qty 1

## 2021-11-23 MED ORDER — ALPRAZOLAM 0.5 MG PO TABS
0.5000 mg | ORAL_TABLET | Freq: Every day | ORAL | Status: DC | PRN
  Administered 2021-11-24 – 2021-11-27 (×3): 0.5 mg via ORAL
  Filled 2021-11-23 (×3): qty 1

## 2021-11-23 MED ORDER — ACETAMINOPHEN 325 MG PO TABS: 650.0000 mg | ORAL_TABLET | Freq: Four times a day (QID) | ORAL | Status: DC | PRN

## 2021-11-23 MED ORDER — POTASSIUM CHLORIDE CRYS ER 20 MEQ PO TBCR
20.0000 meq | EXTENDED_RELEASE_TABLET | Freq: Two times a day (BID) | ORAL | Status: DC
  Administered 2021-11-24 – 2021-11-25 (×4): 20 meq via ORAL
  Filled 2021-11-23 (×4): qty 1

## 2021-11-23 MED ORDER — FLUTICASONE PROPIONATE 50 MCG/ACT NA SUSP
2.0000 | Freq: Every day | NASAL | Status: DC | PRN
  Filled 2021-11-23: qty 16

## 2021-11-23 MED ORDER — CARVEDILOL 3.125 MG PO TABS
3.1250 mg | ORAL_TABLET | Freq: Two times a day (BID) | ORAL | Status: DC
  Administered 2021-11-23: 3.125 mg via ORAL
  Filled 2021-11-23 (×2): qty 1

## 2021-11-23 MED ORDER — HYDROCODONE-ACETAMINOPHEN 5-325 MG PO TABS
1.0000 | ORAL_TABLET | ORAL | Status: DC | PRN
  Administered 2021-11-25: 2 via ORAL
  Filled 2021-11-23: qty 2

## 2021-11-23 MED ORDER — ONDANSETRON HCL 4 MG PO TABS: 4.0000 mg | ORAL_TABLET | Freq: Four times a day (QID) | ORAL | Status: DC | PRN

## 2021-11-23 MED ORDER — MORPHINE SULFATE (PF) 2 MG/ML IV SOLN: 2.0000 mg | INTRAVENOUS | Status: DC | PRN

## 2021-11-23 MED ORDER — ALBUTEROL SULFATE (2.5 MG/3ML) 0.083% IN NEBU: 3.0000 mL | INHALATION_SOLUTION | RESPIRATORY_TRACT | Status: DC | PRN

## 2021-11-23 MED ORDER — POTASSIUM CHLORIDE CRYS ER 20 MEQ PO TBCR
40.0000 meq | EXTENDED_RELEASE_TABLET | ORAL | Status: AC
  Administered 2021-11-23 (×2): 40 meq via ORAL
  Filled 2021-11-23 (×2): qty 2

## 2021-11-23 MED ORDER — VITAMIN B-12 1000 MCG PO TABS
1000.0000 ug | ORAL_TABLET | Freq: Every day | ORAL | Status: DC
  Administered 2021-11-24 – 2021-11-28 (×5): 1000 ug via ORAL
  Filled 2021-11-23 (×6): qty 1

## 2021-11-23 MED ORDER — ACETAMINOPHEN 650 MG RE SUPP: 650.0000 mg | Freq: Four times a day (QID) | RECTAL | Status: DC | PRN

## 2021-11-23 MED ORDER — GUAIFENESIN ER 600 MG PO TB12
600.0000 mg | ORAL_TABLET | Freq: Every day | ORAL | Status: DC
  Administered 2021-11-23 – 2021-11-28 (×6): 600 mg via ORAL
  Filled 2021-11-23 (×6): qty 1

## 2021-11-23 MED ORDER — SERTRALINE HCL 50 MG PO TABS
25.0000 mg | ORAL_TABLET | Freq: Every day | ORAL | Status: DC
  Administered 2021-11-23 – 2021-11-28 (×6): 25 mg via ORAL
  Filled 2021-11-23 (×6): qty 1

## 2021-11-23 NOTE — ED Notes (Signed)
This RN attempted to get urine specimen; patient unable to urinate at this time.

## 2021-11-23 NOTE — ED Provider Notes (Signed)
[facility] Provider Note    Event Date/Time   First MD Initiated Contact with Patient 11/23/21 1350     (approximate)  History   Chief Complaint: Hematuria  HPI  Adrian Chavez is a 81 y.o. male with a past medical history of CHF, DVT on Coumadin who presents to the emergency department for hematuria.  According to the patient he has a history of urinary retention, self caths at home.  States this morning he has been experiencing some lower abdominal discomfort and after cath he was passing clots and blood in the urine.  Patient states this is happened before and cleared with antibiotics.  Wife is concerned because the patient's INR was recently greater than 3.  Physical Exam   Triage Vital Signs: ED Triage Vitals [11/23/21 1243]  Enc Vitals Group     BP 106/79     Pulse Rate 79     Resp 20     Temp (!) 97.4 F (36.3 C)     Temp Source Oral     SpO2 100 %     Weight      Height      Head Circumference      Peak Flow      Pain Score      Pain Loc      Pain Edu?      Excl. in [place]?     Most recent vital signs: Vitals:   11/23/21 1243  BP: 106/79  Pulse: 79  Resp: 20  Temp: (!) 97.4 F (36.3 C)  SpO2: 100%    General: Awake, no distress.  CV:  Good peripheral perfusion.  Regular rate and rhythm  Resp:  Normal effort.  Equal breath sounds bilaterally.  Abd:  No distention.  Soft, slight suprapubic tenderness otherwise benign abdomen no suprapubic fullness.    ED Results / Procedures / Treatments   RADIOLOGY  I have personally reviewed the CT images.  Patient appears to have some urinary bladder distention with what appears to be some clots in the bladder. IMPRESSION: There is no evidence of intestinal obstruction or pneumoperitoneum. There is no hydronephrosis.   Marked cardiomegaly. Coronary artery disease. There is fluid in the lumen of lower thoracic esophagus suggesting gastroesophageal reflux. There is interval increase in  interstitial markings in the posterior lower lung fields suggesting interstitial pneumonitis or progression of scarring.   Marked enlargement of prostate which may be due to benign prostatic hypertrophy or neoplasm. There are multiple diverticula in the margin of the urinary bladder suggesting chronic outlet obstruction.   There is subtle increased density in the lumen of gallbladder suggesting presence of sludge or tiny stones. There are no signs of acute cholecystitis.   Diverticulosis of colon. Lumbar spondylosis with spinal stenosis and encroachment of neural foramina at multiple levels. Possible cysts are seen in the liver and right kidney. Other findings as described in the body of the report.   MEDICATIONS ORDERED IN ED: Medications - No data to display   IMPRESSION / MDM / Chicken / ED COURSE  I reviewed the triage vital signs and the nursing notes.  Patient presents emergency department for some lower abdominal discomfort and hematuria along with clots in the urine.  Patient's lab work shows a hemoglobin of 12.8, slightly down from 2 weeks ago but not significantly decreased.  Patient's renal function is slightly decreased from baseline but again not significantly decreased.  Potassium is 2.7 we will replete in  the emergency department oral potassium.  Bicarb is elevated but unchanged from historical values. Urinalysis does show greater than 50 red cells as well as white cells and white blood cell clumps likely indicating infection.  We will cover with Rocephin. I spoke to urology Dr. Felipa Eth who recommends placing 18 French Foley catheter.  Given the patient's supratherapeutic INR 6.0 I have dosed 5 mg of oral vitamin K.  Given the patient's hemoglobin drop over the past several weeks we will admit the patient to the hospital service to continue to monitor patient's blood counts, reduce INR and treat any possible infection/cystitis.  Hospitalist will be admitting  the patient to their service and urology will be consulting.  FINAL CLINICAL IMPRESSION(S) / ED DIAGNOSES   Hematuria Urinary tract infection Hypokalemia Supratherapeutic INR   Note:  This document was prepared using Dragon voice recognition software and may include unintentional dictation errors.   Harvest Dark, MD 11/23/21 Sharilyn Sites

## 2021-11-23 NOTE — H&P (Signed)
History and Physical    Adrian Chavez YME:[id number] DOB: [birth year] DOA: 11/23/2021  PCP: Dion Body, MD   Patient coming from: home  I have personally briefly reviewed patient's relevant medical records in [place]  Chief Complaint: blood in urine  HPI: Adrian Chavez is a 81 y.o. 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  Patient's INR was recently above 3.  He denies fever or chills.  Denies nausea or vomiting.  Denies bloody or melanotic stools.  ED course: On arrival, vitals within normal limits, but did develop a soft blood pressure of 99/81 around the time of admission. Blood work: Hemoglobin 12.8, down from 14 on 11/07/2021 INR 6 Potassium 2.7 Creatinine 2.22, above baseline of 1.59 Bilirubin 3.9 up from 2.9 10/2021 Urinalysis with many RBCs  CT abdomen and pelvis with mostly nonacute findings but showing marked enlargement of prostate.... Please review complete report under radiographic studies  The emergency room provider spoke with urologist on-call who recommended placing Foley to prevent retraumatized and prostate with self caths,, controlling INR and trending H&H and will follow  Patient received vitamin K 5 mg orally in the ED.  Given a dose of Rocephin for possible UTI.  Hospitalist consulted for admission.   Review of Systems: As per HPI otherwise all other systems on review of systems negative.   Assessment/Plan  Gross hematuria Intermittent self-catheterization due to BPH - Secondary to supratherapeutic INR with possible trauma from self catheterization - Foley catheter to  replace self-catheterization - Urology consulted from the ED and will follow    Supratherapeutic INR on warfarin anticoagulation - Pharmacy to monitor INR s/p vitamin K 5 mg p.o. on 1/20 - Hold warfarin and resume warfarin when appropriate  ABLA (acute blood loss anemia) - Hemoglobin 12.8, down from 14 on 11/07/2021 - Serial H&H and transfuse as appropriate    Hypokalemia - Potassium 2.7 on arrival - Oral repletion and monitor  Possible complicated UTI (urinary tract infection) - Received 1 dose of Rocephin in the ED - We will hold off on further antibiotics pending confirmation of UTI on culture  Hypotension with history of essential hypertension - Systolic under 680 on admission - Hold antihypertensives and resume as appropriate -Small fluid bolus   Acute kidney injury superimposed on CKD lllb ([place]) - Mild worsening of renal function - We will continue to monitor - Small fluid bolus  Hyperbilirubinemia -Bilirubin 3.9 up from 2.9 10/2021, suspect related to CHF - Continue to monitor    AF (paroxysmal atrial fibrillation) (HCC) - Continue amiodarone and carvedilol - Holding warfarin due to supratherapeutic INR and anemia    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 EF <20% 11/01/21 - Continue amiodarone.  Will hold torsemide and carvedilol due to soft blood pressure    DVT prophylaxis: coumadin Code Status: DNR Family Communication:  wife at bedside  Disposition Plan: Back to previous home environment Consults called: urology  Status:At the time of admission, it appears that the appropriate admission status for this patient is INPATIENT. This is judged to be reasonable  and necessary in order to provide the required intensity of service to ensure the patient's safety given the presenting symptoms, physical exam findings, and  initial radiographic and laboratory data in the context of their  Comorbid conditions.   Patient requires inpatient status due to high intensity of service, high risk for further deterioration and high frequency of surveillance required.   I certify that at the point of admission it is my clinical judgment that the patient will require inpatient hospital care spanning beyond 2 midnights     Physical Exam: Vitals:   11/23/21 1700 11/23/21 1730 11/23/21 1800 11/23/21 1900  BP: 109/85 (!) 109/91 119/79 99/81  Pulse: 81 81 84 79  Resp:    18  Temp:      TempSrc:      SpO2: 92% 90% 95% 90%   Constitutional: chronically ill appearing, oriented x 3 . Not in any apparent distress HEENT:      Head: Normocephalic and atraumatic.         Eyes: PERLA, EOMI, Conjunctivae are normal. Sclera is non-icteric.       Mouth/Throat: Mucous membranes are moist.       Neck: Supple with no signs of meningismus. Cardiovascular: Regular rate and rhythm. No murmurs, gallops, or rubs. 2+ symmetrical distal pulses are present . No JVD. No  LE edema Respiratory: Respiratory effort normal .Lungs sounds clear bilaterally. No wheezes, crackles, or rhonchi.  Gastrointestinal: Soft, non tender, non distended. Positive bowel sounds.  Genitourinary: No CVA tenderness. Musculoskeletal: Nontender with normal range of motion in all extremities. No cyanosis, or erythema of extremities. Neurologic:  Face is symmetric. Moving all extremities. No gross focal neurologic deficits . Skin: Skin is warm, dry.  No rash or ulcers Psychiatric: Mood and affect are appropriat     Past Medical History:  Diagnosis Date   Acute urinary retention 03/05/2021   Anxiety    CHF (congestive heart failure) (HCC)    Claustrophobia    Colitis, ischemic ([place]) 2010   developed DVT   DVT (deep venous thrombosis) ([place]) 2010   stomach and leg   Dysrhythmia    A-Fib   History of 2019 novel coronavirus disease (COVID-19) 11/22/2019    Hypertension     Past Surgical History:  Procedure Laterality Date   HERNIA REPAIR Right 2007   IMPLANTABLE CARDIOVERTER DEFIBRILLATOR (ICD) GENERATOR CHANGE Left 02/21/2016   Procedure: ICD GENERATOR CHANGE;  Surgeon: Isaias Cowman, MD;  Location: [facility] ORS;  Service: Cardiovascular;  Laterality: Left;   PACEMAKER INSERTION     PITUITARY SURGERY  2002   Done at [facility]     reports that he quit smoking about 47 years ago. His smoking use included cigarettes. He has never used smokeless tobacco. He reports that he does not drink alcohol and does not use drugs.  Allergies  Allergen Reactions   Escitalopram Oxalate Nausea Only   Oxycodone     vomiting    No family history on file.    Prior to Admission medications   Medication Sig Start Date End Date Taking? Authorizing Provider  albuterol (VENTOLIN HFA) 108 (90 Base) MCG/ACT inhaler SMARTSIG:1-2 Inhalation Via Inhaler Every 4 Hours PRN 12/25/20   [provider]  ALPRAZolam Duanne Moron) 0.5 MG tablet Take 0.5 mg by mouth daily as needed for anxiety.     [provider]  amiodarone (PACERONE) 200 MG tablet Take 1 tablet (200 mg total) by mouth daily. 11/08/21   Wouk, Ailene Rud, MD  carvedilol (COREG) 3.125  MG tablet Take 3.125 mg by mouth 2 (two) times daily.    [provider]  cetirizine (ZYRTEC) 10 MG tablet Take 1 tablet by mouth daily. 06/21/20   [provider]  CIALIS 5 MG tablet Take 5 mg by mouth daily.     [provider]  finasteride (PROSCAR) 5 MG tablet Take 1 tablet (5 mg total) by mouth daily. Patient not taking: Reported on 11/02/2021 03/27/21   Zara Council A, PA-C  fluticasone (FLONASE) 50 MCG/ACT nasal spray Place 2 sprays into both nostrils daily as needed for allergies or rhinitis.    [provider]  potassium chloride SA (K-DUR,KLOR-CON) 20 MEQ tablet Take 20 mEq by mouth 2 (two) times daily.    [provider]  sertraline (ZOLOFT) 25 MG tablet Take  25 mg by mouth daily. 10/04/21   [provider]  torsemide (DEMADEX) 20 MG tablet Take 20 mg by mouth 2 (two) times daily. Patient not taking: Reported on 11/02/2021    [provider]  vitamin B-12 (CYANOCOBALAMIN) 1000 MCG tablet Take 1,000 mcg by mouth daily.    [provider]  warfarin (COUMADIN) 1 MG tablet Take 2 mg by mouth See admin instructions. Take 2 MG by mouth every day    [provider]      Labs on Admission: I have personally reviewed following labs and imaging studies  CBC: Recent Labs  Lab 11/23/21 1246  WBC 5.2  HGB 12.8*  HCT 41.7  MCV 93.7  PLT 062   Basic Metabolic Panel: Recent Labs  Lab 11/23/21 1246  NA 133*  K 2.7*  CL 83*  CO2 39*  GLUCOSE 110*  BUN 46*  CREATININE 2.22*  CALCIUM 8.7*   GFR: CrCl cannot be calculated (Unknown ideal weight.). Liver Function Tests: Recent Labs  Lab 11/23/21 1246  AST 33  ALT 24  ALKPHOS 68  BILITOT 3.9*  PROT 6.2*  ALBUMIN 3.5   Recent Labs  Lab 11/23/21 1246  LIPASE 45   No results for input(s): AMMONIA in the last 168 hours. Coagulation Profile: Recent Labs  Lab 11/23/21 1507  INR 6.0*   Cardiac Enzymes: No results for input(s): CKTOTAL, CKMB, CKMBINDEX, TROPONINI in the last 168 hours. BNP (last 3 results) No results for input(s): PROBNP in the last 8760 hours. HbA1C: No results for input(s): HGBA1C in the last 72 hours. CBG: No results for input(s): GLUCAP in the last 168 hours. Lipid Profile: No results for input(s): CHOL, HDL, LDLCALC, TRIG, CHOLHDL, LDLDIRECT in the last 72 hours. Thyroid Function Tests: No results for input(s): TSH, T4TOTAL, FREET4, T3FREE, THYROIDAB in the last 72 hours. Anemia Panel: No results for input(s): VITAMINB12, FOLATE, FERRITIN, TIBC, IRON, RETICCTPCT in the last 72 hours. Urine analysis:    Component Value Date/Time   COLORURINE RED (A) 11/23/2021 1246   APPEARANCEUR CLOUDY (A) 11/23/2021 1246   APPEARANCEUR  Turbid (A) 10/01/2021 1116   LABSPEC  11/23/2021 1246    TEST NOT REPORTED DUE TO COLOR INTERFERENCE OF URINE PIGMENT   LABSPEC 1.014 04/07/2012 1629   PHURINE  11/23/2021 1246    TEST NOT REPORTED DUE TO COLOR INTERFERENCE OF URINE PIGMENT   GLUCOSEU (A) 11/23/2021 1246    TEST NOT REPORTED DUE TO COLOR INTERFERENCE OF URINE PIGMENT   GLUCOSEU Negative 04/07/2012 1629   HGBUR (A) 11/23/2021 1246    TEST NOT REPORTED DUE TO COLOR INTERFERENCE OF URINE PIGMENT   BILIRUBINUR (A) 11/23/2021 1246    TEST  NOT REPORTED DUE TO COLOR INTERFERENCE OF URINE PIGMENT   BILIRUBINUR Negative 09/18/2021 0830   BILIRUBINUR Negative 04/07/2012 1629   KETONESUR (A) 11/23/2021 1246    TEST NOT REPORTED DUE TO COLOR INTERFERENCE OF URINE PIGMENT   PROTEINUR (A) 11/23/2021 1246    TEST NOT REPORTED DUE TO COLOR INTERFERENCE OF URINE PIGMENT   NITRITE (A) 11/23/2021 1246    TEST NOT REPORTED DUE TO COLOR INTERFERENCE OF URINE PIGMENT   LEUKOCYTESUR (A) 11/23/2021 1246    TEST NOT REPORTED DUE TO COLOR INTERFERENCE OF URINE PIGMENT   LEUKOCYTESUR Negative 04/07/2012 1629    Radiological Exams on Admission: CT Renal Stone Study  Result Date: 11/23/2021 CLINICAL DATA:  Flank pain EXAM: CT ABDOMEN AND PELVIS WITHOUT CONTRAST TECHNIQUE: Multidetector CT imaging of the abdomen and pelvis was performed following the standard protocol without IV contrast. RADIATION DOSE REDUCTION: This exam was performed according to the departmental dose-optimization program which includes automated exposure control, adjustment of the mA and/or kV according to patient size and/or use of iterative reconstruction technique. COMPARISON:  03/05/2021 FINDINGS: Lower chest: Heart is markedly enlarged in size. Biventricular pacer leads are noted in place. Coronary artery calcifications are seen. Increased interstitial markings are seen in the posterior lower lung fields. There are multiple calcified nodules in both lower lung fields  suggesting granulomas. Hepatobiliary: There is 12 mm low-density lesion in the left lobe of liver, possibly cyst. There is 14 mm smooth marginated low-density lesion in the inferior right lobe. There is another 10 mm low-density lesion in the inferior right lobe, possibly suggesting cysts. There is no dilation of bile ducts. There is subtle increased density in the lumen of gallbladder suggesting presence of sludge or tiny stones. There is no pericholecystic stranding. Pancreas: No focal abnormality is seen. Spleen: Spleen measures 12.1 cm in maximum diameter. Adrenals/Urinary Tract: Adrenals are unremarkable. There is no hydronephrosis. There are no renal or ureteral stones. There is 12 mm exophytic lesion in the lower pole of right kidney which has not changed significantly, possibly a cyst. Evaluation is limited in this noncontrast study. Urinary bladder is distended. There are multiple diverticula in the periphery of the urinary bladder largest measuring 6 cm in maximum diameter. Stomach/Bowel: There is fluid in the lumen of lower thoracic esophagus. Stomach is not distended. Small bowel loops are not dilated. Appendix is not seen. There is no pericecal inflammation. Scattered diverticula are seen in the colon without signs of focal acute diverticulitis. Vascular/Lymphatic: Scattered arterial calcifications are seen. No new significant lymphadenopathy seen. Reproductive: There is marked enlargement of prostate projecting into the base of the urinary bladder. Other: There is no ascites or pneumoperitoneum. Small umbilical hernia containing fat is seen. Small left inguinal hernia containing fat is seen. Musculoskeletal: Degenerative changes are noted in the lumbar spine with spinal stenosis and encroachment of neural foramina at multiple levels. IMPRESSION: There is no evidence of intestinal obstruction or pneumoperitoneum. There is no hydronephrosis. Marked cardiomegaly. Coronary artery disease. There is fluid in  the lumen of lower thoracic esophagus suggesting gastroesophageal reflux. There is interval increase in interstitial markings in the posterior lower lung fields suggesting interstitial pneumonitis or progression of scarring. Marked enlargement of prostate which may be due to benign prostatic hypertrophy or neoplasm. There are multiple diverticula in the margin of the urinary bladder suggesting chronic outlet obstruction. There is subtle increased density in the lumen of gallbladder suggesting presence of sludge or tiny stones. There are no signs of acute cholecystitis. Diverticulosis  of colon. Lumbar spondylosis with spinal stenosis and encroachment of neural foramina at multiple levels. Possible cysts are seen in the liver and right kidney. Other findings as described in the body of the report. Electronically Signed   By: Elmer Picker M.D.   On: 11/23/2021 14:48       Athena Masse MD [facility]   11/23/2021, 7:31 PM

## 2021-11-23 NOTE — ED Notes (Signed)
Vitamin K requested from pharmacy

## 2021-11-23 NOTE — ED Triage Notes (Signed)
Pt comes into the ED via EMS from home, states when he self  cath this AM was passing clots, lower abd pain.  113/86 HR98 98%RA 18RR

## 2021-11-24 DIAGNOSIS — N138 Other obstructive and reflux uropathy: Secondary | ICD-10-CM

## 2021-11-24 DIAGNOSIS — N401 Enlarged prostate with lower urinary tract symptoms: Secondary | ICD-10-CM

## 2021-11-24 DIAGNOSIS — N189 Chronic kidney disease, unspecified: Secondary | ICD-10-CM

## 2021-11-24 DIAGNOSIS — D62 Acute posthemorrhagic anemia: Secondary | ICD-10-CM

## 2021-11-24 DIAGNOSIS — R339 Retention of urine, unspecified: Secondary | ICD-10-CM

## 2021-11-24 DIAGNOSIS — R310 Gross hematuria: Secondary | ICD-10-CM

## 2021-11-24 DIAGNOSIS — I480 Paroxysmal atrial fibrillation: Secondary | ICD-10-CM

## 2021-11-24 LAB — HEMOGLOBIN AND HEMATOCRIT, BLOOD
HCT: 39.2 % (ref 39.0–52.0)
Hemoglobin: 12.3 g/dL — ABNORMAL LOW (ref 13.0–17.0)

## 2021-11-24 LAB — BASIC METABOLIC PANEL
Anion gap: 10 (ref 5–15)
BUN: 51 mg/dL — ABNORMAL HIGH (ref 8–23)
CO2: 36 mmol/L — ABNORMAL HIGH (ref 22–32)
Calcium: 8.5 mg/dL — ABNORMAL LOW (ref 8.9–10.3)
Chloride: 88 mmol/L — ABNORMAL LOW (ref 98–111)
Creatinine, Ser: 2.35 mg/dL — ABNORMAL HIGH (ref 0.61–1.24)
GFR, Estimated: 28 mL/min — ABNORMAL LOW (ref >60)
Glucose, Bld: 117 mg/dL — ABNORMAL HIGH (ref 70–99)
Potassium: 3.8 mmol/L (ref 3.5–5.1)
Sodium: 134 mmol/L — ABNORMAL LOW (ref 135–145)

## 2021-11-24 MED ORDER — CHLORHEXIDINE GLUCONATE CLOTH 2 % EX PADS
6.0000 | MEDICATED_PAD | Freq: Every day | CUTANEOUS | Status: DC
  Administered 2021-11-24 – 2021-11-28 (×5): 6 via TOPICAL

## 2021-11-24 MED ORDER — MIDODRINE HCL 5 MG PO TABS
5.0000 mg | ORAL_TABLET | Freq: Three times a day (TID) | ORAL | Status: DC
  Administered 2021-11-24 – 2021-11-28 (×12): 5 mg via ORAL
  Filled 2021-11-24 (×12): qty 1

## 2021-11-24 MED ORDER — SODIUM CHLORIDE 0.9 % IV BOLUS
250.0000 mL | Freq: Once | INTRAVENOUS | Status: AC
  Administered 2021-11-24: 250 mL via INTRAVENOUS

## 2021-11-24 MED ORDER — PHYTONADIONE 5 MG PO TABS
5.0000 mg | ORAL_TABLET | Freq: Once | ORAL | Status: AC
  Administered 2021-11-24: 5 mg via ORAL
  Filled 2021-11-24: qty 1

## 2021-11-24 MED ORDER — MIDODRINE HCL 5 MG PO TABS
10.0000 mg | ORAL_TABLET | Freq: Once | ORAL | Status: AC
  Administered 2021-11-24: 10 mg via ORAL
  Filled 2021-11-24: qty 2

## 2021-11-24 MED ORDER — SODIUM CHLORIDE 0.9 % IV SOLN
1.0000 g | INTRAVENOUS | Status: DC
  Administered 2021-11-24 – 2021-11-25 (×2): 1 g via INTRAVENOUS
  Filled 2021-11-24 (×3): qty 10

## 2021-11-24 NOTE — Assessment & Plan Note (Signed)
Hypotensive for now.  Holding all antihypertensive and using midodrine

## 2021-11-24 NOTE — Assessment & Plan Note (Signed)
Given 5 mg vitamin K on admission.  INR 6.0 will order another 5 mg vitamin K and recheck PT/INR

## 2021-11-24 NOTE — Assessment & Plan Note (Signed)
Chronic combined systolic and diastolic CHF.  ICD in place.  EF less than 20%.

## 2021-11-24 NOTE — Assessment & Plan Note (Signed)
Urology seen while in the ED and replaced Foley with 20 [place] coud catheter.  Patient is draining bloody urine and has been irrigated by urology.  Continue Foley and hand irrigation with normal saline as needed per urology.

## 2021-11-24 NOTE — Assessment & Plan Note (Signed)
Repleted and resolved 

## 2021-11-24 NOTE — Assessment & Plan Note (Signed)
- 

## 2021-11-24 NOTE — Assessment & Plan Note (Signed)
Per his wife who is at bedside his ICD has not fired

## 2021-11-24 NOTE — Assessment & Plan Note (Signed)
Holding warfarin due to hematuria.  Recheck PT/INR as last INR was 6.  We will give another vitamin K 5 mg.  He received 1 dose in the ER

## 2021-11-24 NOTE — ED Notes (Signed)
Matthew RN aware of assigned bed 

## 2021-11-24 NOTE — Assessment & Plan Note (Signed)
Continue Foley

## 2021-11-24 NOTE — Assessment & Plan Note (Signed)
Likely postobstructive.  Foley changed to 20 [place] coud catheter by urologist and draining bloody urine

## 2021-11-24 NOTE — Assessment & Plan Note (Signed)
Bilirubin 3.9 (2.9) likely due to hepatic congestion.  Monitor for now

## 2021-11-24 NOTE — Assessment & Plan Note (Signed)
Normally uses 2 L oxygen.  He is placed on 5 L oxygen.  Will wean as tolerated

## 2021-11-24 NOTE — Progress Notes (Signed)
Progress Note   Patient: Adrian Chavez VQM:[id number] DOB: 1941/10/21 DOA: 11/23/2021     1 DOS: the patient was seen and examined on 11/24/2021   Brief hospital course: 81 y.o. 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  1/21: Urology recommends continuing Foley and irrigation.     Assessment and Plan * Gross hematuria- (present on admission) Urology seen while in the ED and replaced Foley with 20 French coud catheter.  Patient is draining bloody urine and has been irrigated by urology.  Continue Foley and hand irrigation with normal saline as needed per urology.  Hyperbilirubinemia- (present on admission) Bilirubin 3.9 (2.9) likely due to hepatic congestion.  Monitor for now  ABLA (acute blood loss anemia)- (present on admission) Due to hematuria.  Hemoglobin 12.3 monitor for now  Complicated UTI (urinary tract infection)- (present on admission) Continue Rocephin  Hypokalemia- (present on admission) Repleted and resolved  Chronic respiratory failure with hypoxia and hypercapnia ([place])- (present on admission) Normally uses 2 L oxygen.  He is placed on 5 L oxygen.  Will wean as tolerated  Supratherapeutic INR- (present on admission) Given 5 mg vitamin K on admission.  INR 6.0 will order another 5 mg vitamin K and recheck PT/INR  Acute kidney injury superimposed on CKD lllb ([place])- (present on admission) Likely postobstructive.  Foley changed to 20 [place] coud catheter by urologist and draining bloody urine  Warfarin anticoagulation Holding warfarin due to hematuria.  Recheck PT/INR as last INR was 6.   We will give another vitamin K 5 mg.  He received 1 dose in the ER  NICM (nonischemic cardiomyopathy) (HCC) Chronic combined systolic and diastolic CHF.  ICD in place.  EF less than 20%.  ICD (implantable cardioverter-defibrillator) in place- (present on admission) Per his wife who is at bedside his ICD has not fired  Essential hypertension- (present on admission) Hypotensive for now.  Holding all antihypertensive and using midodrine  Chronic combined systolic and diastolic CHF (congestive heart failure) ([place])- (present on admission) Unable to use any diuretic considering hypotension  AF (paroxysmal atrial fibrillation) ([place])- (present on admission) Holding amiodarone and Coreg due to hypotension.  Holding warfarin due to hematuria   Benign prostatic hyperplasia with lower urinary tract symptoms- (present on admission) Continue Foley   Subjective: Sleepy.  Draining grossly bloody urine with clots passing.  Hypotensive, wife at bedside  Objective Hypotensive  Constitutional:chronically ill appearing,  sleeping. Not in any apparent distress HEENT: Head:Normocephalic and atraumatic.  Eyes:PERLA, EOMI, Conjunctivae are normal. Sclera is non-icteric.  Mouth/Throat:Mucous membranes are moist.  Neck:Supple with no signs of meningismus. Cardiovascular:Regular rate and rhythm. No murmurs, gallops, or rubs. 2+ symmetrical distal pulses are present . No JVD. No  LE edema Respiratory:Respiratory effort normal .Lungs sounds clear bilaterally. No wheezes, crackles, or rhonchi.  Gastrointestinal:Soft, non tender, non distended. Positive bowel sounds.  Genitourinary:Foley catheter in place draining bloody urine Musculoskeletal:Nontender with normal range of motion in all extremities. No cyanosis, or erythema of extremities. Neurologic:  Face is symmetric. Moving all extremities. No gross focal neurologic deficits . Skin:Skin is warm, dry.  No rash or  ulcers Psychiatric: Sleeping  Data Reviewed:  Elevated INR of  6.0.  Hemoglobin 12.3.  Creatinine 2.35  Family Communication: Updated wife at bedside  Disposition: Status is: Inpatient  Remains inpatient appropriate because: Having gross hematuria with coagulopathy   DVT prophylaxis -warfarin on hold as INR was 6     Time spent: 35 minutes  Author: Max Sane, MD 11/24/2021 5:32 PM  For on call review [email or web address].

## 2021-11-24 NOTE — Assessment & Plan Note (Signed)
Holding amiodarone and Coreg due to hypotension.  Holding warfarin due to hematuria

## 2021-11-24 NOTE — Assessment & Plan Note (Signed)
Due to hematuria.  Hemoglobin 12.3 monitor for now

## 2021-11-24 NOTE — Assessment & Plan Note (Signed)
Unable to use any diuretic considering hypotension

## 2021-11-24 NOTE — Consult Note (Signed)
Urology Consult   Physician requesting consult: Judd Gaudier, MD  Reason for consult: Gross hematuria, urinary retention  History of Present Illness: ARLYNN MCDERMID is a 81 y.o. male with a history of BPH and urinary retention who presented to the emergency room last night with gross hematuria.  He is followed by [facility] urological and was last seen on 11/20/2021 for a voiding trial.  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  He recently had a Foley catheter placed for urinary retention.  The catheter was removed on 11/20/2021 and the patient was restarted on self-catheterization.  He apparently noted some increased bleeding within the past 2 days.  He presented to the emergency room secondary to the urethral bleeding.  He was found to have an elevated INR to 6.0.  Urinalysis demonstrated >50 RBCs and >50 WBCs.  A Foley catheter was placed in the emergency room.  The patient apparently drained 1300 mL of bloody urine overnight.  He has had decreased urine output this morning.  Creatinine 2.35.  CT renal stone study from 11/23/2021 demonstrated no hydronephrosis, marked enlargement of the prostate with multiple bladder diverticula.  He does have a history of intermittent gross hematuria and was previously evaluated with cystoscopy in June 2022 which showed prominent lateral lobe enlargement of the prostate with moderate elevation of the bladder neck and moderate to severe bladder trabeculation with scattered cellules and diverticula.  Some inflammatory changes noted at the bladder base likely secondary to Foley catheter.  Urine cytology was negative.   Past Medical History:  Diagnosis Date   Acute urinary retention 03/05/2021   Anxiety    CHF (congestive heart failure) ([place])    Claustrophobia    Colitis, ischemic ([place]) 2010   developed DVT   DVT (deep venous thrombosis) ([place]) 2010   stomach and leg   Dysrhythmia    A-Fib   History of 2019 novel coronavirus disease (COVID-19)  11/22/2019   Hypertension     Past Surgical History:  Procedure Laterality Date   HERNIA REPAIR Right 2007   IMPLANTABLE CARDIOVERTER DEFIBRILLATOR (ICD) GENERATOR CHANGE Left 02/21/2016   Procedure: ICD GENERATOR CHANGE;  Surgeon: Isaias Cowman, MD;  Location: [facility] ORS;  Service: Cardiovascular;  Laterality: Left;   PACEMAKER INSERTION     PITUITARY SURGERY  2002   [facility]    Medications:  Scheduled Meds:  amiodarone  200 mg Oral Daily   carvedilol  3.125 mg Oral BID   guaiFENesin  600 mg Oral Daily   potassium chloride SA  20 mEq Oral BID   sertraline  25 mg Oral Daily   vitamin B-12  1,000 mcg Oral Daily   Continuous Infusions: PRN Meds:.acetaminophen **OR** acetaminophen, albuterol, ALPRAZolam, fluticasone, HYDROcodone-acetaminophen, morphine injection, ondansetron **OR** ondansetron (ZOFRAN) IV  Allergies:  Allergies  Allergen Reactions   Escitalopram Oxalate Nausea Only   Oxycodone     vomiting    No family history on file.  Social History:  reports that he quit smoking about 47 years ago. His smoking use included cigarettes. He has never used smokeless tobacco. He reports that he does not drink alcohol and does not use drugs.  ROS: A complete review of systems was performed.  All systems are negative except for pertinent findings as noted.  Physical Exam:  Vital signs in last 24 hours: Temp:  [97.4 F (36.3 C)] 97.4 F (36.3 C) (01/20 1243) Pulse Rate:  [58-84] 80 (01/21 0630) Resp:  [13-27] 16 (01/21 0500) BP: (  72-183)/(43-144) 82/68 (01/21 0630) SpO2:  [84 %-100 %] 96 % (01/21 0630) GENERAL APPEARANCE:  elderly male in bed, NAD HEENT:  Atraumatic, normocephalic, oropharynx clear NECK:  Supple without lymphadenopathy or thyromegaly ABDOMEN:  Soft, non-tender, no masses EXTREMITIES:  Moves all extremities well, without clubbing, cyanosis, or edema NEUROLOGIC:  Alert and oriented x 3, normal gait, CN II-XII grossly intact BACK:  Non-tender to  palpation, No CVAT SKIN:  Warm, dry, and intact GU:  uncircumcised, foley in place draining bloody urine, clot at meatus  Laboratory Data:  Recent Labs    11/23/21 1246 11/23/21 2042 11/24/21 0355  WBC 5.2  --   --   HGB 12.8* 10.6* 12.3*  HCT 41.7 33.6* 39.2  PLT 151  --   --     Recent Labs    11/23/21 1246 11/24/21 0355  NA 133* 134*  K 2.7* 3.8  CL 83* 88*  GLUCOSE 110* 117*  BUN 46* 51*  CALCIUM 8.7* 8.5*  CREATININE 2.22* 2.35*     Results for orders placed or performed during the hospital encounter of 11/23/21 (from the past 24 hour(s))  Lipase, blood     Status: None   Collection Time: 11/23/21 12:46 PM  Result Value Ref Range   Lipase 45 11 - 51 U/L  Comprehensive metabolic panel     Status: Abnormal   Collection Time: 11/23/21 12:46 PM  Result Value Ref Range   Sodium 133 (L) 135 - 145 mmol/L   Potassium 2.7 (LL) 3.5 - 5.1 mmol/L   Chloride 83 (L) 98 - 111 mmol/L   CO2 39 (H) 22 - 32 mmol/L   Glucose, Bld 110 (H) 70 - 99 mg/dL   BUN 46 (H) 8 - 23 mg/dL   Creatinine, Ser 2.22 (H) 0.61 - 1.24 mg/dL   Calcium 8.7 (L) 8.9 - 10.3 mg/dL   Total Protein 6.2 (L) 6.5 - 8.1 g/dL   Albumin 3.5 3.5 - 5.0 g/dL   AST 33 15 - 41 U/L   ALT 24 0 - 44 U/L   Alkaline Phosphatase 68 38 - 126 U/L   Total Bilirubin 3.9 (H) 0.3 - 1.2 mg/dL   GFR, Estimated 30 (L) >60 mL/min   Anion gap 11 5 - 15  CBC     Status: Abnormal   Collection Time: 11/23/21 12:46 PM  Result Value Ref Range   WBC 5.2 4.0 - 10.5 K/uL   RBC 4.45 4.22 - 5.81 MIL/uL   Hemoglobin 12.8 (L) 13.0 - 17.0 g/dL   HCT 41.7 39.0 - 52.0 %   MCV 93.7 80.0 - 100.0 fL   MCH 28.8 26.0 - 34.0 pg   MCHC 30.7 30.0 - 36.0 g/dL   RDW 17.7 (H) 11.5 - 15.5 %   Platelets 151 150 - 400 K/uL   nRBC 0.0 0.0 - 0.2 %  Urinalysis, Routine w reflex microscopic Urine, Catheterized     Status: Abnormal   Collection Time: 11/23/21 12:46 PM  Result Value Ref Range   Color, Urine RED (A) YELLOW   APPearance CLOUDY (A)  CLEAR   Specific Gravity, Urine  1.005 - 1.030    TEST NOT REPORTED DUE TO COLOR INTERFERENCE OF URINE PIGMENT   pH  5.0 - 8.0    TEST NOT REPORTED DUE TO COLOR INTERFERENCE OF URINE PIGMENT   Glucose, UA (A) NEGATIVE mg/dL    TEST NOT REPORTED DUE TO COLOR INTERFERENCE OF URINE PIGMENT   Hgb urine dipstick (A) NEGATIVE  TEST NOT REPORTED DUE TO COLOR INTERFERENCE OF URINE PIGMENT   Bilirubin Urine (A) NEGATIVE    TEST NOT REPORTED DUE TO COLOR INTERFERENCE OF URINE PIGMENT   Ketones, ur (A) NEGATIVE mg/dL    TEST NOT REPORTED DUE TO COLOR INTERFERENCE OF URINE PIGMENT   Protein, ur (A) NEGATIVE mg/dL    TEST NOT REPORTED DUE TO COLOR INTERFERENCE OF URINE PIGMENT   Nitrite (A) NEGATIVE    TEST NOT REPORTED DUE TO COLOR INTERFERENCE OF URINE PIGMENT   Leukocytes,Ua (A) NEGATIVE    TEST NOT REPORTED DUE TO COLOR INTERFERENCE OF URINE PIGMENT   RBC / HPF >50 (H) 0 - 5 RBC/hpf   WBC, UA >50 (H) 0 - 5 WBC/hpf   Bacteria, UA RARE (A) NONE SEEN   Squamous Epithelial / LPF NONE SEEN 0 - 5   WBC Clumps PRESENT   Protime-INR     Status: Abnormal   Collection Time: 11/23/21  3:07 PM  Result Value Ref Range   Prothrombin Time 53.2 (H) 11.4 - 15.2 seconds   INR 6.0 (HH) 0.8 - 1.2  Resp Panel by RT-PCR (Flu A&B, Covid) Nasopharyngeal Swab     Status: None   Collection Time: 11/23/21  4:54 PM   Specimen: Nasopharyngeal Swab; Nasopharyngeal(NP) swabs in vial transport medium  Result Value Ref Range   SARS Coronavirus 2 by RT PCR NEGATIVE NEGATIVE   Influenza A by PCR NEGATIVE NEGATIVE   Influenza B by PCR NEGATIVE NEGATIVE  Hemoglobin and hematocrit, blood     Status: Abnormal   Collection Time: 11/23/21  8:42 PM  Result Value Ref Range   Hemoglobin 10.6 (L) 13.0 - 17.0 g/dL   HCT 33.6 (L) 39.0 - 52.0 %  Hemoglobin and hematocrit, blood     Status: Abnormal   Collection Time: 11/24/21  3:55 AM  Result Value Ref Range   Hemoglobin 12.3 (L) 13.0 - 17.0 g/dL   HCT 39.2 39.0 - 52.0 %   Basic metabolic panel     Status: Abnormal   Collection Time: 11/24/21  3:55 AM  Result Value Ref Range   Sodium 134 (L) 135 - 145 mmol/L   Potassium 3.8 3.5 - 5.1 mmol/L   Chloride 88 (L) 98 - 111 mmol/L   CO2 36 (H) 22 - 32 mmol/L   Glucose, Bld 117 (H) 70 - 99 mg/dL   BUN 51 (H) 8 - 23 mg/dL   Creatinine, Ser 2.35 (H) 0.61 - 1.24 mg/dL   Calcium 8.5 (L) 8.9 - 10.3 mg/dL   GFR, Estimated 28 (L) >60 mL/min   Anion gap 10 5 - 15   Recent Results (from the past 240 hour(s))  Resp Panel by RT-PCR (Flu A&B, Covid) Nasopharyngeal Swab     Status: None   Collection Time: 11/23/21  4:54 PM   Specimen: Nasopharyngeal Swab; Nasopharyngeal(NP) swabs in vial transport medium  Result Value Ref Range Status   SARS Coronavirus 2 by RT PCR NEGATIVE NEGATIVE Final    Comment: (NOTE) SARS-CoV-2 target nucleic acids are NOT DETECTED.  The SARS-CoV-2 RNA is generally detectable in upper respiratory specimens during the acute phase of infection. The lowest concentration of SARS-CoV-2 viral copies this assay can detect is 138 copies/mL. A negative result does not preclude SARS-Cov-2 infection and should not be used as the sole basis for treatment or other patient management decisions. A negative result may occur with  improper specimen collection/handling, submission of specimen other than nasopharyngeal swab, presence of viral  mutation(s) within the areas targeted by this assay, and inadequate number of viral copies(<138 copies/mL). A negative result must be combined with clinical observations, patient history, and epidemiological information. The expected result is Negative.  Fact Sheet for Patients:  EntrepreneurPulse.com.au  Fact Sheet for Healthcare Providers:  IncredibleEmployment.be  This test is no t yet approved or cleared by the [place] FDA and  has been authorized for detection and/or diagnosis of SARS-CoV-2 by FDA under an Emergency Use  Authorization (EUA). This EUA will remain  in effect (meaning this test can be used) for the duration of the COVID-19 declaration under Section 564(b)(1) of the Act, 21 U.S.C.section 360bbb-3(b)(1), unless the authorization is terminated  or revoked sooner.       Influenza A by PCR NEGATIVE NEGATIVE Final   Influenza B by PCR NEGATIVE NEGATIVE Final    Comment: (NOTE) The Xpert Xpress SARS-CoV-2/FLU/RSV plus assay is intended as an aid in the diagnosis of influenza from Nasopharyngeal swab specimens and should not be used as a sole basis for treatment. Nasal washings and aspirates are unacceptable for Xpert Xpress SARS-CoV-2/FLU/RSV testing.  Fact Sheet for Patients: EntrepreneurPulse.com.au  Fact Sheet for Healthcare Providers: IncredibleEmployment.be  This test is not yet approved or cleared by the [place] FDA and has been authorized for detection and/or diagnosis of SARS-CoV-2 by FDA under an Emergency Use Authorization (EUA). This EUA will remain in effect (meaning this test can be used) for the duration of the COVID-19 declaration under Section 564(b)(1) of the Act, 21 U.S.C. section 360bbb-3(b)(1), unless the authorization is terminated or revoked.  Performed at [facility], [address] [place], [place] [address]     Renal Function: Recent Labs    11/23/21 1246 11/24/21 0355  CREATININE 2.22* 2.35*   CrCl cannot be calculated (Unknown ideal weight.).  Radiologic Imaging: CT Renal Stone Study  Result Date: 11/23/2021 CLINICAL DATA:  Flank pain EXAM: CT ABDOMEN AND PELVIS WITHOUT CONTRAST TECHNIQUE: Multidetector CT imaging of the abdomen and pelvis was performed following the standard protocol without IV contrast. RADIATION DOSE REDUCTION: This exam was performed according to the departmental dose-optimization program which includes automated exposure control, adjustment of the mA and/or kV according to  patient size and/or use of iterative reconstruction technique. COMPARISON:  03/05/2021 FINDINGS: Lower chest: Heart is markedly enlarged in size. Biventricular pacer leads are noted in place. Coronary artery calcifications are seen. Increased interstitial markings are seen in the posterior lower lung fields. There are multiple calcified nodules in both lower lung fields suggesting granulomas. Hepatobiliary: There is 12 mm low-density lesion in the left lobe of liver, possibly cyst. There is 14 mm smooth marginated low-density lesion in the inferior right lobe. There is another 10 mm low-density lesion in the inferior right lobe, possibly suggesting cysts. There is no dilation of bile ducts. There is subtle increased density in the lumen of gallbladder suggesting presence of sludge or tiny stones. There is no pericholecystic stranding. Pancreas: No focal abnormality is seen. Spleen: Spleen measures 12.1 cm in maximum diameter. Adrenals/Urinary Tract: Adrenals are unremarkable. There is no hydronephrosis. There are no renal or ureteral stones. There is 12 mm exophytic lesion in the lower pole of right kidney which has not changed significantly, possibly a cyst. Evaluation is limited in this noncontrast study. Urinary bladder is distended. There are multiple diverticula in the periphery of the urinary bladder largest measuring 6 cm in maximum diameter. Stomach/Bowel: There is fluid in the lumen of lower thoracic esophagus. Stomach  is not distended. Small bowel loops are not dilated. Appendix is not seen. There is no pericecal inflammation. Scattered diverticula are seen in the colon without signs of focal acute diverticulitis. Vascular/Lymphatic: Scattered arterial calcifications are seen. No new significant lymphadenopathy seen. Reproductive: There is marked enlargement of prostate projecting into the base of the urinary bladder. Other: There is no ascites or pneumoperitoneum. Small umbilical hernia containing fat is  seen. Small left inguinal hernia containing fat is seen. Musculoskeletal: Degenerative changes are noted in the lumbar spine with spinal stenosis and encroachment of neural foramina at multiple levels. IMPRESSION: There is no evidence of intestinal obstruction or pneumoperitoneum. There is no hydronephrosis. Marked cardiomegaly. Coronary artery disease. There is fluid in the lumen of lower thoracic esophagus suggesting gastroesophageal reflux. There is interval increase in interstitial markings in the posterior lower lung fields suggesting interstitial pneumonitis or progression of scarring. Marked enlargement of prostate which may be due to benign prostatic hypertrophy or neoplasm. There are multiple diverticula in the margin of the urinary bladder suggesting chronic outlet obstruction. There is subtle increased density in the lumen of gallbladder suggesting presence of sludge or tiny stones. There are no signs of acute cholecystitis. Diverticulosis of colon. Lumbar spondylosis with spinal stenosis and encroachment of neural foramina at multiple levels. Possible cysts are seen in the liver and right kidney. Other findings as described in the body of the report. Electronically Signed   By: Elmer Picker M.D.   On: 11/23/2021 14:48    I independently reviewed the above imaging studies.  Procedure: I attempted to irrigate the indwelling 16 French Foley catheter without success.  The catheter was removed.  Under sterile conditions, a 20 [place] coud catheter was placed with immediate return of bloody urine.  10 mL of sterile water placed in the balloon.  The bladder was irrigated with 1500 mL of normal saline until the urine was light pink in color.  A few small clots were obtained.  He tolerated the procedure well.  Impression/Recommendation Gross hematuria - likely secondary to urethral trauma with CIC in setting of supra-therapeutic INR BPH with obstruction Urinary retention Supra-therapeutic  INR Possible UTI  Continue foley Hand irrigate with NS prn Correct coagulopathy Await urine cultures Will follow during hospitalization  Michaelle Birks 11/24/2021, 12:26 PM

## 2021-11-24 NOTE — Hospital Course (Addendum)
81 y.o. 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  1/21: Urology recommends continuing Foley and irrigation. 1/22 - still blood in foley but no clots 1/23 - urine c/s growing pseudomonas - Abx changed to PO cipro. Foley with minimal blood 1/24 - patient was scheduled to go home with hospice but DME/Hospital bed couldn't be delivered today so likely will stay overnight

## 2021-11-25 LAB — PROTIME-INR
INR: 5.3 (ref 0.8–1.2)
INR: 6 (ref 0.8–1.2)
INR: 5.3 (ref 0.8–1.2)
Prothrombin Time: 48.6 seconds — ABNORMAL HIGH (ref 11.4–15.2)
Prothrombin Time: 53.4 seconds — ABNORMAL HIGH (ref 11.4–15.2)
Prothrombin Time: 48.3 seconds — ABNORMAL HIGH (ref 11.4–15.2)

## 2021-11-25 LAB — BASIC METABOLIC PANEL
Anion gap: 13 (ref 5–15)
BUN: 63 mg/dL — ABNORMAL HIGH (ref 8–23)
CO2: 32 mmol/L (ref 22–32)
Calcium: 8.7 mg/dL — ABNORMAL LOW (ref 8.9–10.3)
Chloride: 88 mmol/L — ABNORMAL LOW (ref 98–111)
Creatinine, Ser: 2.49 mg/dL — ABNORMAL HIGH (ref 0.61–1.24)
GFR, Estimated: 26 mL/min — ABNORMAL LOW (ref >60)
Glucose, Bld: 79 mg/dL (ref 70–99)
Potassium: 4.4 mmol/L (ref 3.5–5.1)
Sodium: 133 mmol/L — ABNORMAL LOW (ref 135–145)

## 2021-11-25 LAB — CBC
HCT: 39.3 % (ref 39.0–52.0)
Hemoglobin: 12.3 g/dL — ABNORMAL LOW (ref 13.0–17.0)
MCH: 28.9 pg (ref 26.0–34.0)
MCHC: 31.3 g/dL (ref 30.0–36.0)
MCV: 92.3 fL (ref 80.0–100.0)
Platelets: 214 10*3/uL (ref 150–400)
RBC: 4.26 MIL/uL (ref 4.22–5.81)
RDW: 17.8 % — ABNORMAL HIGH (ref 11.5–15.5)
WBC: 6.7 10*3/uL (ref 4.0–10.5)
nRBC: 0 % (ref 0.0–0.2)

## 2021-11-25 MED ORDER — ORAL CARE MOUTH RINSE
15.0000 mL | Freq: Two times a day (BID) | OROMUCOSAL | Status: DC
  Administered 2021-11-25 – 2021-11-28 (×7): 15 mL via OROMUCOSAL

## 2021-11-25 MED ORDER — SODIUM CHLORIDE 0.9 % IV SOLN: INTRAVENOUS | Status: DC

## 2021-11-25 MED ORDER — CHLORHEXIDINE GLUCONATE 0.12 % MT SOLN
15.0000 mL | Freq: Two times a day (BID) | OROMUCOSAL | Status: DC
  Administered 2021-11-25 – 2021-11-28 (×7): 15 mL via OROMUCOSAL
  Filled 2021-11-25 (×7): qty 15

## 2021-11-25 NOTE — Assessment & Plan Note (Signed)
Holding amiodarone and Coreg due to hypotension.  Holding warfarin due to hematuria.  Waiting for PT/INR today INR yesterday was 5.3

## 2021-11-25 NOTE — Assessment & Plan Note (Signed)
Likely postobstructive.  Foley changed to 20 [place] coud catheter by urologist and draining bloody urine.  We will start NS at 50 cc an hour for now and consult nephrology

## 2021-11-25 NOTE — Progress Notes (Signed)
Pt's continues to cough, clear his throat and spit up large amounts of sputum after drinking thin liquids.  His voice also sounds wet and gurgly.  Pt made NPO and ST consult placed for further evaluation and appropriate and safer diet order.

## 2021-11-25 NOTE — Progress Notes (Signed)
Pt's 02 sats decreased to 88% on 4L of 02 via n/c; 02 increased to 6L to sustain 02 sats >92%.

## 2021-11-25 NOTE — Assessment & Plan Note (Signed)
Per his wife who is at bedside his ICD has not fired

## 2021-11-25 NOTE — Assessment & Plan Note (Signed)
INR 6->5.3.  Given total 10 mg vitamin K thus far.  Await INR from today to decide need for more vitamin K

## 2021-11-25 NOTE — Assessment & Plan Note (Signed)
Holding warfarin due to hematuria.  INR  6-> 5.3.  Recheck INR today

## 2021-11-25 NOTE — Assessment & Plan Note (Signed)
Unable to use any diuretic considering hypotension.  Started midodrine

## 2021-11-25 NOTE — Assessment & Plan Note (Signed)
Hypotensive for now.  Holding all antihypertensive and using midodrine for now

## 2021-11-25 NOTE — Assessment & Plan Note (Signed)
Urology seen while in the ED and replaced Foley with 20 [place] coud catheter.  Patient is draining bloody urine and has been irrigated by urology.  Continue Foley and hand irrigation with normal saline as needed per urology.  Still has bloody urine although drying up in the bag

## 2021-11-25 NOTE — Assessment & Plan Note (Signed)
- 

## 2021-11-25 NOTE — Assessment & Plan Note (Signed)
Chronic combined systolic and diastolic CHF.  ICD in place.  EF less than 20%.

## 2021-11-25 NOTE — Evaluation (Signed)
Clinical/Bedside Swallow Evaluation Patient Details  Name: Adrian Chavez MRN: [id number] Date of Birth: 16-Dec-1940  Today's Date: 11/25/2021 Time: SLP Start Time (ACUTE ONLY): 0840 SLP Stop Time (ACUTE ONLY): 0910 SLP Time Calculation (min) (ACUTE ONLY): 30 min  Past Medical History:  Past Medical History:  Diagnosis Date   Acute urinary retention 03/05/2021   Anxiety    CHF (congestive heart failure) ([place])    Claustrophobia    Colitis, ischemic ([place]) 2010   developed DVT   DVT (deep venous thrombosis) ([place]) 2010   stomach and leg   Dysrhythmia    A-Fib   History of 2019 novel coronavirus disease (COVID-19) 11/22/2019   Hypertension    Past Surgical History:  Past Surgical History:  Procedure Laterality Date   HERNIA REPAIR Right 2007   IMPLANTABLE CARDIOVERTER DEFIBRILLATOR (ICD) GENERATOR CHANGE Left 02/21/2016   Procedure: ICD GENERATOR CHANGE;  Surgeon: Isaias Cowman, MD;  Location: [facility] ORS;  Service: Cardiovascular;  Laterality: Left;   PACEMAKER INSERTION     PITUITARY SURGERY  2002   Done at [place]   HPI:  Per 68 H&P "Adrian Chavez is a 81 y.o. 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  Patient's INR was recently above 3.  He denies fever or chills.  Denies nausea or vomiting.  Denies bloody or melanotic stools.     ED course: On arrival, vitals within normal limits, but did develop a soft blood pressure of 99/81 around the time of admission.  Blood work: Hemoglobin 12.8, down from 14 on 11/07/2021  INR 6  Potassium 2.7  Creatinine 2.22, above baseline of 1.59  Bilirubin 3.9 up from 2.9  10/2021  Urinalysis with many RBCs     CT abdomen and pelvis with mostly nonacute findings but showing marked enlargement of prostate.... Please review complete report under radiographic studies     The emergency room provider spoke with urologist on-call who recommended placing Foley to prevent retraumatized and prostate with self caths,, controlling INR and trending H&H and will follow     Patient received vitamin K 5 mg orally in the ED.  Given a dose of Rocephin for possible UTI.  Hospitalist consulted for admission."    Assessment / Plan / Recommendation  Clinical Impression  Pt seen for clinical swallowing evaluation. Pt alert, pleasant, and cooperative. Hoarse vocal quality noted. Known to SLP services from recent admission earlier this month. Last SLP note indicated pt consuming a mech soft diet with thin liquids without difficulty.   SLP consult received this AM. Per Nursing documentation, "Pt's continues to cough, clear his throat and spit up large amounts of sputum after drinking thin liquids.  His voice also sounds wet and gurgly." Per pt, pt coughing following taking pills whole with water which "happens sometimes." Pt noted being "sleepy" when incident occurred this AM. Pt denies difficulty swallowing at home.   Oral motor exam completed. Pt with missing upper dentition; upper partial not present for evaluation. Pt with xerostomia with dried/cracked appearance to lingual body. No obvious orofacial weakness/decreased ROM appreciated. Pt currently on 6L/min O2 via [place].   Per chart review, no recent chest imaging. Temp and  WBC WNL.  Pt given trials of ice chips, thin liquids via cup/straw, puree, and solid. Pt present with s/sx mild oral dysphagia c/b mildly prolonged mastication of solids likely due to dental status. No overt s/sx pharyngeal dysphagia noted across trials. To palpation, pt with seemingly timely swallow initiation and seemingly adequate laryngeal elevation. No change to vocal  quality across trials. Pt benefited from initial cueing for small bites/sips and slow rate of intake. Of note, pt required repositioning prior to trials for upright position.  Recommend mech soft diet with thin liquids and safe swallowing strategies as outlined below. Recommend pills x1 at a time (vs crushed) in applesauce for ease of swallowing.   Pt is at increased risk for aspiration/aspiration PNA given dental status, multiple medical comorbidities, and respiratory status.   SLP to f/u per POC for diet tolerance.  Pt and RN made aware of diet recommendations, safe swallowing strategies/aspiration precautions, and SLP POC. Pt verbalized understanding/agreement.   SLP Visit Diagnosis: Dysphagia, oral phase (R13.11)    Aspiration Risk  Mild aspiration risk    Diet Recommendation Dysphagia 3 (Mech soft);Thin liquid   Medication Administration: Whole meds with puree (vs crushed) Supervision: Patient able to self feed (set up assistance; intial reminder for use of safe swallowing strategies) Compensations: Minimize environmental distractions;Slow rate;Small sips/bites;Follow solids with liquid Postural Changes: Seated upright at 90 degrees;Remain upright for at least 30 minutes after po intake    Other  Recommendations Oral Care Recommendations: Oral care QID    Recommendations for follow up therapy are one component of a multi-disciplinary discharge planning process, led by the attending physician.  Recommendations may be updated based on patient status, additional functional criteria and insurance authorization.  Follow up Recommendations  (TBD)      Assistance Recommended at Discharge  (TBD)  Functional Status Assessment Patient has had a recent decline in their functional status and demonstrates the ability to make significant improvements in function in a reasonable and predictable amount of time.  Frequency and Duration min 2x/week  2 weeks       Prognosis Prognosis for Safe  Diet Advancement: Fair Barriers to Reach Goals: Severity of deficits      Swallow Study   General Date of Onset: 11/23/21 HPI: Per [id number]'[id number] H&P "Adrian Chavez is a 81 y.o. 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  Patient's INR was recently above 3.  He denies fever or chills.  Denies nausea or vomiting.  Denies bloody or melanotic stools.     ED course: On arrival, vitals within normal limits, but did develop a soft blood pressure of 99/81 around the time of admission.  Blood work: Hemoglobin 12.8, down from 14 on 11/07/2021  INR 6  Potassium 2.7  Creatinine 2.22, above baseline of 1.59  Bilirubin 3.9 up from 2.9 10/2021  Urinalysis with many RBCs     CT abdomen and pelvis with mostly nonacute findings but showing marked enlargement of prostate.... Please review complete report under radiographic studies     The emergency room provider spoke with urologist on-call who recommended placing Foley to prevent retraumatized and prostate with self caths,, controlling INR and trending H&H  and will follow     Patient received vitamin K 5 mg orally in the ED.  Given a dose of Rocephin for possible UTI.  Hospitalist consulted for admission." Type of Study: Bedside Swallow Evaluation Previous Swallow Assessment: known to SLP services; last consuming mech soft diet with thin liquids per EMR Diet Prior to this Study: NPO Temperature Spikes Noted: No Respiratory Status: Nasal cannula (6L/min) History of Recent Intubation: No Behavior/Cognition: Alert;Cooperative;Pleasant mood Oral Cavity Assessment: Dry Oral Care Completed by SLP:  Yes Oral Cavity - Dentition: Missing dentition;Other (Comment) (has partials; not present) Vision: Functional for self-feeding Self-Feeding Abilities: Able to feed self;Needs set up Patient Positioning: Upright in bed (assistance for repositioning) Baseline Vocal Quality: Hoarse Volitional Cough: Strong Volitional Swallow: Able to elicit    Oral/Motor/Sensory Function Overall Oral Motor/Sensory Function: Within functional limits   Ice Chips Ice chips: Within functional limits Presentation: Self Fed Other Comments: x10   Thin Liquid Thin Liquid: Within functional limits Presentation: Cup;Spoon Other Comments: ~6 oz    Puree Puree: Within functional limits Presentation: Self Fed Other Comments: ~ 2oz   Solid     Solid: Impaired Presentation: Self Fed Oral Phase Impairments: Impaired mastication Oral Phase Functional Implications: Impaired mastication     Cherrie Gauze, M.S., [facility] [phone] ([place])  Quintella Baton 11/25/2021,11:03 AM

## 2021-11-25 NOTE — Plan of Care (Signed)

## 2021-11-25 NOTE — Assessment & Plan Note (Signed)
Due to hematuria.  Hemoglobin 12.3 monitor for now

## 2021-11-25 NOTE — Progress Notes (Signed)
Progress Note   Patient: Adrian Chavez KZS:[id number] DOB: 1941-01-22 DOA: 11/23/2021     2 DOS: the patient was seen and examined on 11/25/2021   Brief hospital course: 81 y.o. 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  1/21: Urology recommends continuing Foley and irrigation.     Assessment and Plan * Gross hematuria- (present on admission) Urology seen while in the ED and replaced Foley with 20 French coud catheter.  Patient is draining bloody urine and has been irrigated by urology.  Continue Foley and hand irrigation with normal saline as needed per urology.  Still has bloody urine although drying up in the bag  Hyperbilirubinemia- (present on admission) Bilirubin 3.9 (2.9) likely due to hepatic congestion.  Monitor for now  ABLA (acute blood loss anemia)- (present on admission) Due to hematuria.  Hemoglobin 12.3 monitor for now  Complicated UTI (urinary tract infection)- (present on admission) Continue Rocephin  Hypokalemia- (present on admission) Repleted and resolved  Chronic respiratory failure with hypoxia and hypercapnia ([place])- (present on admission) Normally uses 2 L oxygen.  Now he is requiring 5 to 6 L  Supratherapeutic INR- (present on admission) INR 6->5.3.  Given total 10 mg vitamin K thus far.  Await INR from today to decide need for more vitamin K  Acute kidney injury superimposed on CKD lllb ([place])- (present on admission) Likely postobstructive.  Foley changed to 20 [place] coud catheter by urologist and draining bloody urine.  We will start NS at 50 cc an hour for now and consult  nephrology  Warfarin anticoagulation Holding warfarin due to hematuria.  INR  6-> 5.3.  Recheck INR today  NICM (nonischemic cardiomyopathy) (HCC) Chronic combined systolic and diastolic CHF.  ICD in place.  EF less than 20%.  ICD (implantable cardioverter-defibrillator) in place- (present on admission) Per his wife who is at bedside his ICD has not fired  Essential hypertension- (present on admission) Hypotensive for now.  Holding all antihypertensive and using midodrine for now  Chronic combined systolic and diastolic CHF (congestive heart failure) ([place])- (present on admission) Unable to use any diuretic considering hypotension.  Started midodrine  AF (paroxysmal atrial fibrillation) ([place])- (present on admission) Holding amiodarone and Coreg due to hypotension.  Holding warfarin due to hematuria.  Waiting for PT/INR today INR yesterday was 5.3  Benign prostatic hyperplasia with lower urinary tract symptoms- (present on admission) Continue Foley   Subjective: No new complaints.  Requesting to talk to family and has people with all the numbers listed sitting on the table.  Objective Hypoxic requiring 5 to 6 L oxygen  Constitutional:chronically ill appearing,lethargic. Not in any apparent distress HEENT: Head:Normocephalic and atraumatic.  Eyes:PERLA, EOMI, Conjunctivae are normal. Sclera is non-icteric.  Mouth/Throat:Mucous membranes are moist.  Neck:Supple with no signs of meningismus. Cardiovascular:Regular rate and rhythm. No murmurs, gallops, or rubs. 2+ symmetrical distal pulses are present . No JVD. No LE edema Respiratory:Respiratory effort normal .Lungs sounds clear bilaterally. No wheezes, crackles, or rhonchi.  Gastrointestinal:Soft, non tender, non distended. Positive bowel sounds.  Genitourinary:Foley catheter in place draining bloody urine Musculoskeletal:Nontender with normal range of motion in all extremities. No cyanosis, or  erythema of extremities.  Neurologic: Face is symmetric. Moving all extremities. No gross focal neurologic deficits . Skin:Skin is warm, dry. No rash or ulcers Psychiatric: Lethargic  Data Reviewed:  INR 5.3 from yesterday.  Creatinine 2.49  Family Communication: Updated patient's son Merrily Pew who is HC POA over phone  Disposition: Status is: Inpatient  Remains inpatient appropriate because: Getting treated for AKI, hematuria and acquired colopathy   DVT prophylaxis-INR still 5.3  Time spent: 35 minutes  Author: Max Sane, MD 11/25/2021 1:20 PM  For on call review [email or web address].

## 2021-11-25 NOTE — Assessment & Plan Note (Signed)
Continue Foley

## 2021-11-25 NOTE — Assessment & Plan Note (Signed)
Normally uses 2 L oxygen.  Now he is requiring 5 to 6 L

## 2021-11-25 NOTE — Assessment & Plan Note (Signed)
Repleted and resolved 

## 2021-11-25 NOTE — Assessment & Plan Note (Signed)
Bilirubin 3.9 (2.9) likely due to hepatic congestion.  Monitor for now

## 2021-11-26 ENCOUNTER — Ambulatory Visit: Payer: HMO | Admitting: Family

## 2021-11-26 DIAGNOSIS — Z7189 Other specified counseling: Secondary | ICD-10-CM

## 2021-11-26 DIAGNOSIS — R310 Gross hematuria: Secondary | ICD-10-CM | POA: Diagnosis not present

## 2021-11-26 DIAGNOSIS — R339 Retention of urine, unspecified: Secondary | ICD-10-CM | POA: Diagnosis not present

## 2021-11-26 LAB — CBC
HCT: 38.8 % — ABNORMAL LOW (ref 39.0–52.0)
Hemoglobin: 12 g/dL — ABNORMAL LOW (ref 13.0–17.0)
MCH: 29.1 pg (ref 26.0–34.0)
MCHC: 30.9 g/dL (ref 30.0–36.0)
MCV: 93.9 fL (ref 80.0–100.0)
Platelets: 233 10*3/uL (ref 150–400)
RBC: 4.13 MIL/uL — ABNORMAL LOW (ref 4.22–5.81)
RDW: 18 % — ABNORMAL HIGH (ref 11.5–15.5)
WBC: 5.7 10*3/uL (ref 4.0–10.5)
nRBC: 0.4 % — ABNORMAL HIGH (ref 0.0–0.2)

## 2021-11-26 LAB — PROTIME-INR
INR: 5.3 (ref 0.8–1.2)
Prothrombin Time: 48.8 seconds — ABNORMAL HIGH (ref 11.4–15.2)

## 2021-11-26 LAB — BASIC METABOLIC PANEL
Anion gap: 12 (ref 5–15)
BUN: 76 mg/dL — ABNORMAL HIGH (ref 8–23)
CO2: 35 mmol/L — ABNORMAL HIGH (ref 22–32)
Calcium: 8.9 mg/dL (ref 8.9–10.3)
Chloride: 88 mmol/L — ABNORMAL LOW (ref 98–111)
Creatinine, Ser: 3.08 mg/dL — ABNORMAL HIGH (ref 0.61–1.24)
GFR, Estimated: 20 mL/min — ABNORMAL LOW (ref >60)
Glucose, Bld: 88 mg/dL (ref 70–99)
Potassium: 4.5 mmol/L (ref 3.5–5.1)
Sodium: 135 mmol/L (ref 135–145)

## 2021-11-26 LAB — URINE CULTURE: Culture: 80000 — AB

## 2021-11-26 MED ORDER — CIPROFLOXACIN HCL 500 MG PO TABS
500.0000 mg | ORAL_TABLET | Freq: Every day | ORAL | Status: DC
  Administered 2021-11-27 – 2021-11-28 (×2): 500 mg via ORAL
  Filled 2021-11-26 (×2): qty 1

## 2021-11-26 MED ORDER — PHYTONADIONE 5 MG PO TABS
10.0000 mg | ORAL_TABLET | Freq: Once | ORAL | Status: AC
  Administered 2021-11-26: 10 mg via ORAL
  Filled 2021-11-26: qty 2

## 2021-11-26 NOTE — Progress Notes (Signed)
Urology Consult Follow Up  Subjective: Patient resting comfortably.  No bladder discomfort.   Foley in place draining clear rusty brown urine with no clots.    VSS afebrile  Serum creatinine 3.08 and is up from 2.49 yesterday.  Hemoglobin 12.0 and HCT 38.8% which is stable.  INR 5.3 and Prothrombin Time 48.3 from yesterday.  No results available this am.  Urine culture positive for Klebsiella oxytoca and pseudomonas aeruginosa.    Anti-infectives: Anti-infectives (From admission, onward)    Start     Dose/Rate Route Frequency Ordered Stop   11/24/21 1900  cefTRIAXone (ROCEPHIN) 1 g in sodium chloride 0.9 % 100 mL IVPB        1 g 200 mL/hr over 30 Minutes Intravenous Every 24 hours 11/24/21 1730     11/23/21 1700  cefTRIAXone (ROCEPHIN) 1 g in sodium chloride 0.9 % 100 mL IVPB        1 g 200 mL/hr over 30 Minutes Intravenous  Once 11/23/21 1653 11/23/21 1814       Current Facility-Administered Medications  Medication Dose Route Frequency Provider Last Rate Last Admin   0.9 %  sodium chloride infusion   Intravenous Continuous Max Sane, MD 50 mL/hr at 11/25/21 2159 New Bag at 11/25/21 2159   acetaminophen (TYLENOL) tablet 650 mg  650 mg Oral Q6H PRN Athena Masse, MD       Or   acetaminophen (TYLENOL) suppository 650 mg  650 mg Rectal Q6H PRN Athena Masse, MD       albuterol (PROVENTIL) (2.5 MG/3ML) 0.083% nebulizer solution 3 mL  3 mL Inhalation Q4H PRN Athena Masse, MD       ALPRAZolam Duanne Moron) tablet 0.5 mg  0.5 mg Oral Daily PRN Judd Gaudier V, MD   0.5 mg at 11/24/21 2225   cefTRIAXone (ROCEPHIN) 1 g in sodium chloride 0.9 % 100 mL IVPB  1 g Intravenous Q24H Max Sane, MD 200 mL/hr at 11/25/21 1803 1 g at 11/25/21 1803   chlorhexidine (PERIDEX) 0.12 % solution 15 mL  15 mL Mouth Rinse BID Max Sane, MD   15 mL at 11/25/21 2156   Chlorhexidine Gluconate Cloth 2 % PADS 6 each  6 each Topical Daily Athena Masse, MD   6 each at 11/25/21 1106   fluticasone (FLONASE)  50 MCG/ACT nasal spray 2 spray  2 spray Each Nare Daily PRN Athena Masse, MD       guaiFENesin (MUCINEX) 12 hr tablet 600 mg  600 mg Oral Daily Judd Gaudier V, MD   600 mg at 11/25/21 0946   HYDROcodone-acetaminophen (NORCO/VICODIN) 5-325 MG per tablet 1-2 tablet  1-2 tablet Oral Q4H PRN Athena Masse, MD   2 tablet at 11/25/21 0945   MEDLINE mouth rinse  15 mL Mouth Rinse q12n4p Max Sane, MD   15 mL at 11/25/21 1804   midodrine (PROAMATINE) tablet 5 mg  5 mg Oral TID WC Max Sane, MD   5 mg at 11/25/21 1804   ondansetron (ZOFRAN) tablet 4 mg  4 mg Oral Q6H PRN Athena Masse, MD       Or   ondansetron [facility]) injection 4 mg  4 mg Intravenous Q6H PRN Athena Masse, MD       potassium chloride SA (KLOR-CON M) CR tablet 20 mEq  20 mEq Oral BID Athena Masse, MD   20 mEq at 11/25/21 2156   sertraline (ZOLOFT) tablet 25 mg  25 mg Oral Daily  Athena Masse, MD   25 mg at 11/25/21 0945   vitamin B-12 (CYANOCOBALAMIN) tablet 1,000 mcg  1,000 mcg Oral Daily Athena Masse, MD   1,000 mcg at 11/25/21 0946     Objective: Vital signs in last 24 hours: Temp:  [97.3 F (36.3 C)-98.3 F (36.8 C)] 97.4 F (36.3 C) (01/23 0750) Pulse Rate:  [60-83] 83 (01/23 0750) Resp:  [16-21] 16 (01/23 0750) BP: (91-103)/(57-77) 91/76 (01/23 0750) SpO2:  [89 %-99 %] 97 % (01/23 0750)  Intake/Output from previous day: 01/22 0701 - 01/23 0700 In: 1031.5 [P.O.:200; I.V.:731.5; IV Piggyback:100] Out: 750 [Urine:750] Intake/Output this shift: No intake/output data recorded.   Physical Exam Constitutional:  Well nourished. Alert and oriented, No acute distress. HEENT: [place] AT, mask in place.  Trachea midline Cardiovascular: No clubbing, cyanosis, or edema. Respiratory: Normal respiratory effort, no increased work of breathing. GU: No CVA tenderness.  No bladder fullness or masses.  Patient with uncircumcised phallus. Mild paraphimosis, reduced manually.  Foreskin easily retracted.  Foley in place.   Neurologic: Grossly intact, no focal deficits, moving all 4 extremities. Psychiatric: Normal mood and affect.   Lab Results:  Recent Labs    11/25/21 0439 11/26/21 0506  WBC 6.7 5.7  HGB 12.3* 12.0*  HCT 39.3 38.8*  PLT 214 233   BMET Recent Labs    11/25/21 0439 11/26/21 0506  NA 133* 135  K 4.4 4.5  CL 88* 88*  CO2 32 35*  GLUCOSE 79 88  BUN 63* 76*  CREATININE 2.49* 3.08*  CALCIUM 8.7* 8.9   PT/INR Recent Labs    11/25/21 0439 11/25/21 1321  LABPROT 53.4* 48.3*  INR 6.0* 5.3*   ABG No results for input(s): PHART, HCO3 in the last 72 hours.  Invalid input(s): PCO2, PO2  Studies/Results: No results found.   Assessment and Plan: 81 year old male with a history of BPH and urinary retention who presented to the emergency room on November 23, 2021 for urinary retention and gross hematuria.  He had a Foley placed previously but was discontinued on November 20, 2021, so patient was restarted on self-catheterization.  He was having some increased bleeding with the catheterization and presented to the emergency room.  He was found to have an elevated INR of 6 and urinalysis demonstrated greater than 50 RBCs and greater than 50 WBCs.  Foley catheter was placed to drain with 1300 mL of bloody urine.  The CT renal stone study from November 23, 2021 demonstrated no hydronephrosis with marked enlargement of the prostate with multiple bladder diverticuli.    He has had a recent hematuria work up which was negative.  Recommendations: -hand irrigate with NS prn for clots -correct coagulopathy -urine culture results available - will switch to po Cipro  -will continue to follow     LOS: 3 days    [facility] 11/26/2021

## 2021-11-26 NOTE — Plan of Care (Signed)
  Problem: Health Behavior/Discharge Planning: Goal: Ability to manage health-related needs will improve Outcome: Progressing   

## 2021-11-26 NOTE — Assessment & Plan Note (Signed)
Likely postobstructive.  Foley changed to 20 [place] coud catheter by urologist and draining bloody urine.Nephrology following. Renal function worsening some - could be due to hematuria  Lab Results  Component Value Date   CREATININE 3.08 (H) 11/26/2021   CREATININE 2.49 (H) 11/25/2021   CREATININE 2.35 (H) 11/24/2021

## 2021-11-26 NOTE — Assessment & Plan Note (Signed)
Unable to use any diuretic considering hypotension. continue midodrine

## 2021-11-26 NOTE — Assessment & Plan Note (Signed)
Per his wife who is at bedside his ICD has not fired

## 2021-11-26 NOTE — TOC Initial Note (Signed)
Transition of Care [facility]) - Initial/Assessment Note    Patient Details  Name: Adrian Chavez MRN: [id number] Date of Birth: Apr 10, 1941  Transition of [facility]) CM/SW Contact:    Kerin Salen, RN Phone Number: 11/26/2021, 3:13 PM  Clinical Narrative:  Jonelle Sidle from [facility], called to report that patient is active with them receiving RN and PT services so please refer back when discharged.                       Patient Goals and CMS Choice        Expected Discharge Plan and Services                                                Prior Living Arrangements/Services                       Activities of Daily Living Home Assistive Devices/Equipment: Environmental consultant (specify type), Bedside commode/3-in-1, Cane (specify quad or straight), Shower chair with back ADL Screening (condition at time of admission) Patient's cognitive ability adequate to safely complete daily activities?: Yes Is the patient deaf or have difficulty hearing?: Yes Does the patient have difficulty seeing, even when wearing glasses/contacts?: Yes Does the patient have difficulty concentrating, remembering, or making decisions?: Yes Patient able to express need for assistance with ADLs?: Yes Does the patient have difficulty dressing or bathing?: No Independently performs ADLs?: Yes (appropriate for developmental age) Communication: Independent Dressing (OT): Independent Is this a change from baseline?: Pre-admission baseline Grooming: Independent Is this a change from baseline?: Pre-admission baseline Feeding: Independent Is this a change from baseline?: Pre-admission baseline Bathing: Independent Is this a change from baseline?: Pre-admission baseline Toileting: Independent Is this a change from baseline?: Pre-admission baseline Walks in Home: Independent Is this a change from baseline?: Pre-admission baseline Does the patient have difficulty walking or climbing stairs?: Yes Weakness  of Legs: None Weakness of Arms/Hands: None  Permission Sought/Granted                  Emotional Assessment              Admission diagnosis:  Hemorrhagic cystitis [N30.91] Gross hematuria [R31.0] Supratherapeutic INR [R79.1] Hematuria, unspecified type [R31.9] Patient Active Problem List   Diagnosis Date Noted   Gross hematuria 11/23/2021   Supratherapeutic INR 11/23/2021   Chronic respiratory failure with hypoxia and hypercapnia ([place]) 11/23/2021   Hypokalemia 24/23/5361   Complicated UTI (urinary tract infection) 11/23/2021   ABLA (acute blood loss anemia) 11/23/2021   Intermittent self-catheterization of bladder 11/23/2021   Hyperbilirubinemia 11/23/2021   Pulmonary hypertension ([place]) 11/03/2021   Moderate to severe mitral regurgitation 11/03/2021   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[place]) 11/03/2021   OSA (obstructive sleep apnea) 11/03/2021   Acute on chronic respiratory failure with hypoxia and hypercapnia ([place]) 10/31/2021   Acute kidney injury superimposed on CKD lllb ([place]) 03/06/2021   NICM (nonischemic cardiomyopathy) ([place]) 03/05/2021   Warfarin anticoagulation 03/05/2021   Chronic combined systolic and diastolic CHF (congestive heart failure) ([place]) 02/26/2018   Essential hypertension 05/17/2014   ICD (implantable cardioverter-defibrillator) in place 05/17/2014   AF (paroxysmal atrial fibrillation) ([place]) 03/03/2014   Benign prostatic hyperplasia with lower urinary tract symptoms 07/10/2012   PCP:  Dion Body, MD Pharmacy:   [id number] - [place], [place] -  [address] [place] [address] Phone: [phone] Fax: [phone]     Social Determinants of Health (SDOH) Interventions    Readmission Risk Interventions Readmission Risk Prevention Plan 11/08/2021  Transportation Screening Complete  PCP or Specialist Appt within 5-7 Days Complete  Home Care Screening Complete  Medication Review (RN CM) Complete  Some  recent data might be hidden

## 2021-11-26 NOTE — Assessment & Plan Note (Signed)
Due to hematuria.  Hemoglobin stable for now

## 2021-11-26 NOTE — Assessment & Plan Note (Signed)
Chronic combined systolic and diastolic CHF.  ICD in place.  EF less than 20%.

## 2021-11-26 NOTE — Assessment & Plan Note (Signed)
Continue Foley, finesteride

## 2021-11-26 NOTE — Assessment & Plan Note (Signed)
Repleted and resolved 

## 2021-11-26 NOTE — Progress Notes (Signed)
[facility]  ROUNDING NOTE   Subjective:   Adrian Chavez is a 81 y.o. male with past medical concerns including A-Fib on Coumadin, hypertension. 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 Patients presents to ED with complaints of bloody urine. He has been admitted for Hemorrhagic cystitis [N30.91] Gross hematuria [R31.0] Supratherapeutic INR [R79.1] Hematuria, unspecified type [R31.9]   Patient is known to our clinic and receives outpatient nephrology care with Dr Holley Raring. Patient is seen and evaluated at bedside. Patient reports that he has been self catheterizing at home for 3-4 months due to BPH. He states he used a curved tip catheter instead of straight and this resulted in hematuria. He became concerned when he began passing clots also. Reports abdominal cramping on admission, denies pain and discomfort at this time. Denies fever and chills. Denies nausea, vomiting, and bloody stools.   Labs on arrival include potassium 2.7, creatinine 2.22 with GFR 30. UA positive for hematuria. Hgb 12.8 and INR 6. CT abdomen and pelvis show enlarged prostate. Urology consulted in ED to replace foley.   Objective:  Vital signs in last 24 hours:  Temp:  [97.3 F (36.3 C)-98.3 F (36.8 C)] 97.4 F (36.3 C) (01/23 0750) Pulse Rate:  [60-83] 83 (01/23 0750) Resp:  [16-21] 16 (01/23 0750) BP: (91-103)/(57-77) 91/76 (01/23 0750) SpO2:  [89 %-99 %] 97 % (01/23 0750)  Weight change:  Filed Weights   11/24/21 1729  Weight: 83.1 kg    Intake/Output: I/O last 3 completed shifts: In: 1031.5 [P.O.:200; I.V.:731.5; IV Piggyback:100] Out: 1600 [Urine:1600]   Intake/Output this shift:  Total I/O In: 480 [P.O.:480] Out: -   Physical Exam: General: NAD  Head: Normocephalic, atraumatic. Moist oral mucosal membranes  Eyes: Anicteric  Lungs:  Clear to auscultation, fine basilar crackles, normal effort   Heart: Regular rate and rhythm  Abdomen:  Soft, nontender  Extremities:  no peripheral edema.  Neurologic: Nonfocal, moving all four extremities  Skin: No lesions  GU  Foley catheter    Basic Metabolic Panel: Recent Labs  Lab 11/23/21 1246 11/24/21 0355 11/25/21 0439 11/26/21 0506  NA 133* 134* 133* 135  K 2.7* 3.8 4.4 4.5  CL 83* 88* 88* 88*  CO2 39* 36* 32 35*  GLUCOSE 110* 117* 79 88  BUN 46* 51* 63* 76*  CREATININE 2.22* 2.35* 2.49* 3.08*  CALCIUM 8.7* 8.5* 8.7* 8.9    Liver Function Tests: Recent Labs  Lab 11/23/21 1246  AST 33  ALT 24  ALKPHOS 68  BILITOT 3.9*  PROT 6.2*  ALBUMIN 3.5   Recent Labs  Lab 11/23/21 1246  LIPASE 45   No results for input(s): AMMONIA in the last 168 hours.  CBC: Recent Labs  Lab 11/23/21 1246 11/23/21 2042 11/24/21 0355 11/25/21 0439 11/26/21 0506  WBC 5.2  --   --  6.7 5.7  HGB 12.8* 10.6* 12.3* 12.3* 12.0*  HCT 41.7 33.6* 39.2 39.3 38.8*  MCV 93.7  --   --  92.3 93.9  PLT 151  --   --  214 233    Cardiac Enzymes: No results for input(s): CKTOTAL, CKMB, CKMBINDEX, TROPONINI in the last 168 hours.  BNP: Invalid input(s): POCBNP  CBG: No results for input(s): GLUCAP in the last 168 hours.  Microbiology: Results for orders placed or performed during the hospital encounter of 11/23/21  Urine Culture     Status: Abnormal   Collection Time: 11/23/21  4:53 PM   Specimen: Urine, Random  Result Value Ref Range Status   Specimen Description   Final    URINE, RANDOM Performed at [facility], [address] [place], [place] [address]    Special Requests   Final    NONE Performed at [facility], [place],  [place] [address]    Culture (A)  Final    80,000 COLONIES/mL KLEBSIELLA OXYTOCA 40,000 COLONIES/mL PSEUDOMONAS AERUGINOSA    Report Status 11/26/2021 FINAL  Final   Organism ID, Bacteria KLEBSIELLA OXYTOCA (A)  Final   Organism ID, Bacteria PSEUDOMONAS AERUGINOSA (A)   Final      Susceptibility   Klebsiella oxytoca - MIC*    AMPICILLIN >=32 RESISTANT Resistant     CEFAZOLIN 8 SENSITIVE Sensitive     CEFEPIME <=0.12 SENSITIVE Sensitive     CEFTRIAXONE <=0.25 SENSITIVE Sensitive     CIPROFLOXACIN <=0.25 SENSITIVE Sensitive     GENTAMICIN <=1 SENSITIVE Sensitive     IMIPENEM <=0.25 SENSITIVE Sensitive     NITROFURANTOIN <=16 SENSITIVE Sensitive     TRIMETH/SULFA <=20 SENSITIVE Sensitive     AMPICILLIN/SULBACTAM 16 INTERMEDIATE Intermediate     PIP/TAZO <=4 SENSITIVE Sensitive     * 80,000 COLONIES/mL KLEBSIELLA OXYTOCA   Pseudomonas aeruginosa - MIC*    CEFTAZIDIME 4 SENSITIVE Sensitive     CIPROFLOXACIN <=0.25 SENSITIVE Sensitive     GENTAMICIN <=1 SENSITIVE Sensitive     IMIPENEM 1 SENSITIVE Sensitive     PIP/TAZO 8 SENSITIVE Sensitive     CEFEPIME 2 SENSITIVE Sensitive     * 40,000 COLONIES/mL PSEUDOMONAS AERUGINOSA  Resp Panel by RT-PCR (Flu A&B, Covid) Nasopharyngeal Swab     Status: None   Collection Time: 11/23/21  4:54 PM   Specimen: Nasopharyngeal Swab; Nasopharyngeal(NP) swabs in vial transport medium  Result Value Ref Range Status   SARS Coronavirus 2 by RT PCR NEGATIVE NEGATIVE Final    Comment: (NOTE) SARS-CoV-2 target nucleic acids are NOT DETECTED.  The SARS-CoV-2 RNA is generally detectable in upper respiratory specimens during the acute phase of infection. The lowest concentration of SARS-CoV-2 viral copies this assay can detect is 138 copies/mL. A negative result does not preclude SARS-Cov-2 infection and should not be used as the sole basis for treatment or other patient management decisions. A negative result may occur with  improper specimen collection/handling, submission of specimen other than nasopharyngeal swab, presence of viral mutation(s) within the areas targeted by this assay, and inadequate number of viral copies(<138 copies/mL). A negative result must be combined with clinical observations, patient history, and  epidemiological information. The expected result is Negative.  Fact Sheet for Patients:  EntrepreneurPulse.com.au  Fact Sheet for Healthcare Providers:  IncredibleEmployment.be  This test is no t yet approved or cleared by the [place] FDA and  has been authorized for detection and/or diagnosis of SARS-CoV-2 by FDA under an Emergency Use Authorization (EUA). This EUA will remain  in effect (meaning this test can be used) for the duration of the COVID-19 declaration under Section 564(b)(1) of the Act, 21 U.S.C.section 360bbb-3(b)(1), unless the authorization is terminated  or revoked sooner.       Influenza A by PCR NEGATIVE NEGATIVE Final   Influenza B by PCR NEGATIVE NEGATIVE Final    Comment: (NOTE) The Xpert Xpress SARS-CoV-2/FLU/RSV plus assay is intended as an aid in the diagnosis of influenza from Nasopharyngeal swab specimens and should not be used as a sole basis for treatment. Nasal washings  and aspirates are unacceptable for Xpert Xpress SARS-CoV-2/FLU/RSV testing.  Fact Sheet for Patients: EntrepreneurPulse.com.au  Fact Sheet for Healthcare Providers: IncredibleEmployment.be  This test is not yet approved or cleared by the [place] FDA and has been authorized for detection and/or diagnosis of SARS-CoV-2 by FDA under an Emergency Use Authorization (EUA). This EUA will remain in effect (meaning this test can be used) for the duration of the COVID-19 declaration under Section 564(b)(1) of the Act, 21 U.S.C. section 360bbb-3(b)(1), unless the authorization is terminated or revoked.  Performed at [facility], [address] [place], [place] [address]     Coagulation Studies: Recent Labs    11/23/21 1507 11/24/21 1805 11/25/21 0439 11/25/21 1321  LABPROT 53.2* 48.6* 53.4* 48.3*  INR 6.0* 5.3* 6.0* 5.3*    Urinalysis: Recent Labs    11/23/21 1246  COLORURINE  RED*  LABSPEC TEST NOT REPORTED DUE TO COLOR INTERFERENCE OF URINE PIGMENT  PHURINE TEST NOT REPORTED DUE TO COLOR INTERFERENCE OF URINE PIGMENT  GLUCOSEU TEST NOT REPORTED DUE TO COLOR INTERFERENCE OF URINE PIGMENT*  HGBUR TEST NOT REPORTED DUE TO COLOR INTERFERENCE OF URINE PIGMENT*  BILIRUBINUR TEST NOT REPORTED DUE TO COLOR INTERFERENCE OF URINE PIGMENT*  KETONESUR TEST NOT REPORTED DUE TO COLOR INTERFERENCE OF URINE PIGMENT*  PROTEINUR TEST NOT REPORTED DUE TO COLOR INTERFERENCE OF URINE PIGMENT*  NITRITE TEST NOT REPORTED DUE TO COLOR INTERFERENCE OF URINE PIGMENT*  LEUKOCYTESUR TEST NOT REPORTED DUE TO COLOR INTERFERENCE OF URINE PIGMENT*      Imaging: No results found.   Medications:    sodium chloride 50 mL/hr at 11/25/21 2159    chlorhexidine  15 mL Mouth Rinse BID   Chlorhexidine Gluconate Cloth  6 each Topical Daily   [START ON 11/27/2021] ciprofloxacin  500 mg Oral Q breakfast   guaiFENesin  600 mg Oral Daily   mouth rinse  15 mL Mouth Rinse q12n4p   midodrine  5 mg Oral TID WC   sertraline  25 mg Oral Daily   vitamin B-12  1,000 mcg Oral Daily   acetaminophen **OR** acetaminophen, albuterol, ALPRAZolam, fluticasone, HYDROcodone-acetaminophen, ondansetron **OR** ondansetron (ZOFRAN) IV  Assessment/ Plan:  Mr. JERED HEINY is a 81 y.o.  male with past medical concerns including A-Fib on Coumadin, hypertension. 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 Patients presents to ED with complaints of bloody urine. He has been admitted for Hemorrhagic cystitis [N30.91] Gross hematuria [R31.0] Supratherapeutic INR [R79.1] Hematuria, unspecified type [R31.9]   Acute Kidney Injury on chronic kidney disease stage IIIb with baseline creatinine 1.59 and GFR of 44 on 11/08/21.  Acute kidney injury secondary to hematuria from traumatic foley insertion Chronic kidney disease is secondary to  hypertension and CHF CT abdomen and pelvis show enlarged prostate. CT renal stone negative for obstruction.   Agreed with IVF, but will stop due to basilar crackles   Will continue to monitor renal function  Creatinine expected to remain elevated due to presence hematuria   Lab Results  Component Value Date   CREATININE 3.08 (H) 11/26/2021   CREATININE 2.49 (H) 11/25/2021   CREATININE 2.35 (H) 11/24/2021    Intake/Output Summary (Last 24 hours) at 11/26/2021 1204 Last data filed at 11/26/2021 0900 Gross per 24 hour  Intake 1511.48 ml  Output 750 ml  Net 761.48 ml   2. Hypertension with chronic kidney disease. Home regimen includes Carvedilol and torsemide. All currently held due to hypotension.  Receiving Midodrine  3. BPH with urinary retention. Urology replaced foley in ED with 20 french catheter. Recommending manual irrigation to manage clots. Awaiting urine culture to mange antibiotic therapy.     LOS: 3 Lyon Dumont 1/23/202312:04 PM

## 2021-11-26 NOTE — Progress Notes (Signed)
Progress Note   Patient: Adrian Chavez ATF:[id number] DOB: [birth year] DOA: 11/23/2021     3 DOS: the patient was seen and examined on 11/26/2021   Brief hospital course: 81 y.o. 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  1/21: Urology recommends continuing Foley and irrigation. 1/22 - still blood in foley but no clots 1/23 - urine c/s growing pseudomonas - Abx changed to PO cipro. Foley with minimal blood     Assessment and Plan * Gross hematuria- (present on admission) Urology replaced Foley with 20 French coud catheter. Urine is now darker but not much blood.  Hyperbilirubinemia- (present on admission) Bilirubin 3.9 (2.9) likely due to hepatic congestion.  Monitor for now  ABLA (acute blood loss anemia)- (present on admission) Due to hematuria.  Hemoglobin stable for now  Complicated UTI (urinary tract infection)- (present on admission) Urine c/s growing pseudomonas and Klebsiella - change Abx to Cipro    Hypokalemia- (present on admission) Repleted and resolved  Chronic respiratory failure with hypoxia and hypercapnia ([place])- (present on admission) At baseline uses 2 L oxygen. Same now  Supratherapeutic INR- (present on admission) INR 6->5.3.  Given total 10 mg vitamin K thus far. Will give vitamin K 10 mg today and recheck INR tomorrow  Acute kidney injury superimposed on CKD lllb ([place])- (present on admission) Likely postobstructive.  Foley changed to 20 [place] coud catheter by urologist and draining bloody urine.Nephrology following. Renal function worsening some - could be due to  hematuria  Lab Results  Component Value Date   CREATININE 3.08 (H) 11/26/2021   CREATININE 2.49 (H) 11/25/2021   CREATININE 2.35 (H) 11/24/2021    Warfarin anticoagulation Holding warfarin due to hematuria.  INR  6-> 5.3.  Recheck INR tomorrow  NICM (nonischemic cardiomyopathy) (HCC) Chronic combined systolic and diastolic CHF.  ICD in place.  EF less than 20%.  ICD (implantable cardioverter-defibrillator) in place- (present on admission) Per his wife who is at bedside his ICD has not fired  Essential hypertension- (present on admission) Hypotensive -.  Holding all antihypertensive and using midodrine for now  Chronic combined systolic and diastolic CHF (congestive heart failure) ([place])- (present on admission) Unable to use any diuretic considering hypotension. continue midodrine  AF (paroxysmal atrial fibrillation) ([place])- (present on admission) Holding amiodarone and Coreg due to hypotension.  Holding warfarin due to hematuria. INR still 5.3. will give Vit K 10 mg  Benign prostatic hyperplasia with lower urinary tract symptoms- (present on admission) Continue Foley, finesteride   Subjective: awake and feeling much better. No new c/o. 2 sons at bedside  Objective Vital signs were reviewed and unremarkable.  Constitutional:chronically ill appearing, awake and alert. Not in any apparent distress HEENT: Head:Normocephalic and atraumatic.  Eyes:PERLA, EOMI, Conjunctivae are normal. Sclera is non-icteric.  Mouth/Throat:Mucous membranes are moist.  Neck:Supple with no signs of meningismus. Cardiovascular:Regular rate and rhythm. No murmurs, gallops, or rubs. 2+ symmetrical distal pulses are present . No JVD. No LE edema Respiratory:Respiratory effort normal .Lungs sounds clear bilaterally. No wheezes, crackles, or rhonchi.  Gastrointestinal:Soft, non tender, non distended. Positive bowel sounds.  Genitourinary:Foley catheter in place draining dark  urine Musculoskeletal:Nontender with normal range of motion  in all extremities. No cyanosis, or erythema of extremities. Neurologic: Face is symmetric. Moving all extremities. No gross focal neurologic deficits . Skin:Skin is warm, dry. No rash or ulcers Psychiatric: normal mood and affect  Data Reviewed:  inr 5.3  Family Communication: 2 sons updated at bedside  Disposition: Status is: Inpatient  Remains inpatient appropriate because: hematuria and INR mgmt    DVT prophylaxis - elevated INR  Time spent: 35 minutes  Author: Max Sane, MD 11/26/2021 8:28 PM  For on call review [email or web address].

## 2021-11-26 NOTE — Assessment & Plan Note (Signed)
Bilirubin 3.9 (2.9) likely due to hepatic congestion.  Monitor for now

## 2021-11-26 NOTE — Assessment & Plan Note (Signed)
Holding warfarin due to hematuria.  INR  6-> 5.3.  Recheck INR tomorrow

## 2021-11-26 NOTE — Consult Note (Addendum)
Consultation Note Date: 11/26/2021   Patient Name: Adrian Chavez  DOB: 1940/11/23  MRN: [id number]  Age / Sex: 81 y.o., male  PCP: Dion Body, MD Referring Physician: Max Sane, MD  Reason for Consultation: Establishing goals of care  HPI/Patient Profile: 81 y.o. 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 Clinical Assessment and Goals of Care: Patient is sitting in bed.  No family at bedside.  He states he lives at home with his wife who was had a stroke.  He states he has 2 children.  We discussed the tattoos on his arms.  He states he was in the [employer].  He states until 2 years ago he rode a motorcycle; he states his wife asked him to sell it, and he misses it every day.  He discusses that he was doing well and was functionally independent until his previous admission a few weeks ago.  He tells me he was going to the gym 2 hours at a time.  He states since that admission, he has been very weak and he began using a walker.  He states he uses his CPAP most of each night.  He discusses his catheterization in the middle of the night that led to the bleeding that brought him into this admission.  He states he is unaware of why his INR was so high on presentation this admission.  He states he has been eating and drinking regularly.  He states he would never want CPR or to be placed on a ventilator.  He states at this time he would like to continue to treat the treatable as able.   Will continue to shadow for needs.     SUMMARY OF  RECOMMENDATIONS   Recommend outpatient palliative to follow with transition to hospice level care when patient is ready.   Prognosis:  < 6 months      Primary Diagnoses: Present on Admission:  Gross hematuria  Supratherapeutic INR  AF (paroxysmal atrial fibrillation) (HCC)  ICD (implantable cardioverter-defibrillator) in place  Essential hypertension  Acute kidney injury superimposed on CKD lllb (HCC)  Chronic respiratory failure with hypoxia and hypercapnia (HCC)  Benign prostatic hyperplasia with lower urinary tract symptoms  Chronic combined systolic and diastolic CHF (congestive heart failure) (HCC)  Hypokalemia  Complicated UTI (urinary tract infection)  ABLA (acute blood loss anemia)  Hyperbilirubinemia   I have reviewed the medical record, interviewed the patient and family, and examined the patient. The following aspects are pertinent.  Past Medical History:  Diagnosis Date   Acute urinary retention 03/05/2021   Anxiety    CHF (congestive heart failure) (HCC)    Claustrophobia    Colitis, ischemic ([place]) 2010  developed DVT   DVT (deep venous thrombosis) ([place]) 2010   stomach and leg   Dysrhythmia    A-Fib   History of 2019 novel coronavirus disease (COVID-19) 11/22/2019   Hypertension    Social History   Socioeconomic History   Marital status: Married    Spouse name: Not on file   Number of children: Not on file   Years of education: Not on file   Highest education level: Not on file  Occupational History   Not on file  Tobacco Use   Smoking status: Former    Types: Cigarettes    Quit date: 02/13/1974    Years since quitting: 47.8   Smokeless tobacco: Never  Substance and Sexual Activity   Alcohol use: No   Drug use: No   Sexual activity: Not on file  Other Topics Concern   Not on file  Social History Narrative   Not on file   Social Determinants of Health   Financial Resource Strain: Not on file  Food Insecurity: Not on file   Transportation Needs: Not on file  Physical Activity: Not on file  Stress: Not on file  Social Connections: Not on file   No family history on file. Scheduled Meds:  chlorhexidine  15 mL Mouth Rinse BID   Chlorhexidine Gluconate Cloth  6 each Topical Daily   [START ON 11/27/2021] ciprofloxacin  500 mg Oral Q breakfast   guaiFENesin  600 mg Oral Daily   mouth rinse  15 mL Mouth Rinse q12n4p   midodrine  5 mg Oral TID WC   sertraline  25 mg Oral Daily   vitamin B-12  1,000 mcg Oral Daily   Continuous Infusions: PRN Meds:.acetaminophen **OR** acetaminophen, albuterol, ALPRAZolam, fluticasone, HYDROcodone-acetaminophen, ondansetron **OR** ondansetron (ZOFRAN) IV Medications Prior to Admission:  Prior to Admission medications   Medication Sig Start Date End Date Taking? Authorizing Provider  ALPRAZolam Duanne Moron) 0.5 MG tablet Take 0.5 mg by mouth daily as needed for anxiety.    Yes [provider]  amiodarone (PACERONE) 200 MG tablet Take 1 tablet (200 mg total) by mouth daily. 11/08/21  Yes Wouk, Ailene Rud, MD  carvedilol (COREG) 3.125 MG tablet Take 3.125 mg by mouth 2 (two) times daily.   Yes [provider]  guaiFENesin (MUCINEX) 600 MG 12 hr tablet Take 600 mg by mouth daily.   Yes [provider]  metolazone (ZAROXOLYN) 5 MG tablet Take 5 mg by mouth 2 (two) times a week. Tues, Saturday   Yes [provider]  potassium chloride SA (K-DUR,KLOR-CON) 20 MEQ tablet Take 20 mEq by mouth 2 (two) times daily.   Yes [provider]  sertraline (ZOLOFT) 25 MG tablet Take 25 mg by mouth daily. 10/04/21  Yes [provider]  torsemide (DEMADEX) 20 MG tablet Take 20 mg by mouth 2 (two) times daily.   Yes [provider]  vitamin B-12 (CYANOCOBALAMIN) 1000 MCG tablet Take 1,000 mcg by mouth daily.   Yes [provider]  albuterol (VENTOLIN HFA) 108 (90 Base) MCG/ACT inhaler SMARTSIG:1-2 Inhalation Via Inhaler Every 4 Hours PRN  12/25/20   [provider]  CIALIS 5 MG tablet Take 5 mg by mouth daily.     [provider]  finasteride (PROSCAR) 5 MG tablet Take 1 tablet (5 mg total) by mouth daily. Patient not taking: Reported on 11/23/2021 03/27/21   Zara Council A, PA-C  fluticasone (FLONASE) 50 MCG/ACT nasal spray Place 2 sprays into both nostrils daily as  needed for allergies or rhinitis.    [provider]  warfarin (COUMADIN) 1 MG tablet Take 2 mg by mouth See admin instructions. Take 2 MG by mouth every day Patient not taking: Reported on 11/23/2021    [provider]   Allergies  Allergen Reactions   Escitalopram Oxalate Nausea Only   Oxycodone     vomiting   Review of Systems  Constitutional:  Positive for fatigue.   Physical Exam Pulmonary:     Effort: Pulmonary effort is normal.  Neurological:     Mental Status: He is alert.    Vital Signs: BP 91/76 (BP Location: Right Arm)    Pulse 83    Temp (!) 97.4 F (36.3 C)    Resp 16    Ht 5' 7.99" (1.727 m)    Wt 83.1 kg    SpO2 97%    BMI 27.86 kg/m  Pain Scale: 0-10   Pain Score: 0-No pain   SpO2: SpO2: 97 % O2 Device:SpO2: 97 % O2 Flow Rate: .O2 Flow Rate (L/min): 3 L/min  IO: Intake/output summary:  Intake/Output Summary (Last 24 hours) at 11/26/2021 1436 Last data filed at 11/26/2021 1300 Gross per 24 hour  Intake 1751.48 ml  Output 400 ml  Net 1351.48 ml    LBM: Last BM Date: 11/24/21 Baseline Weight: Weight: 83.1 kg Most recent weight: Weight: 83.1 kg        Time In: 2:15 Time Out: 2:45 Time Total: 30 min Greater than 50%  of this time was spent counseling and coordinating care related to the above assessment and plan.  Signed by: Asencion Gowda, NP   Please contact Palliative Medicine Team phone at [phone] for questions and concerns.  For individual provider: See Shea Evans

## 2021-11-26 NOTE — Assessment & Plan Note (Signed)
Holding amiodarone and Coreg due to hypotension.  Holding warfarin due to hematuria. INR still 5.3. will give Vit K 10 mg

## 2021-11-26 NOTE — Assessment & Plan Note (Signed)
Urine c/s growing pseudomonas and Klebsiella - change Abx to Cipro

## 2021-11-26 NOTE — Assessment & Plan Note (Signed)
At baseline uses 2 L oxygen. Same now

## 2021-11-26 NOTE — Assessment & Plan Note (Signed)
INR 6->5.3.  Given total 10 mg vitamin K thus far. Will give vitamin K 10 mg today and recheck INR tomorrow

## 2021-11-26 NOTE — Assessment & Plan Note (Signed)
Urology replaced Foley with 20 French coud catheter. Urine is now darker but not much blood.

## 2021-11-26 NOTE — Assessment & Plan Note (Signed)
Hypotensive -.  Holding all antihypertensive and using midodrine for now

## 2021-11-26 NOTE — Progress Notes (Signed)
Speech Language Pathology Treatment:    Patient Details Name: Adrian Chavez MRN: [id number] DOB: 10/02/1941 Today's Date: 11/26/2021 Time: 8416-6063 SLP Time Calculation (min) (ACUTE ONLY): 15 min  Assessment / Plan / Recommendation Clinical Impression  Pt seen for diet tolerance. Sons at bedside. Pt just consumed breakfast with sons present. Per sons, pt without difficulty consuming breakfast. Pt and sons deny pt with any s/sx pharyngeal dysphagia.   Observed pt with sips of water via straw. Again, no overt s/sx pharyngeal dysphagia.   Per SLP observation and pt/family report, pt appears to be tolerating current diet without overt s/sx pharyngeal dysphagia.  Per chart review, temp and WBC WNL. No recent chest imaging. Appears comfortable with no s/sx distress on 3L/min O2 via [place].   Recommend continuation of mech soft diet with thin liquids and safe swallowing strategies/aspiration precautions as outlined below.   SLP to sign off as pt has no acute SLP needs. Pt and family are aware and in agreement with diet recommendations, safe swallowing strategies, and SLP POC. Pt left in care of MD.     HPI HPI: Per 50 H&P "APOSTOLOS BLAGG is a 81 y.o. 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  Patient's INR was recently above 3.  He denies fever or chills.  Denies nausea or vomiting.  Denies bloody or melanotic stools.     ED course: On arrival, vitals within normal limits, but did develop a soft blood pressure of 99/81 around the time of admission.  Blood work: Hemoglobin 12.8, down from 14 on 11/07/2021  INR 6  Potassium  2.7  Creatinine 2.22, above baseline of 1.59  Bilirubin 3.9 up from 2.9 10/2021  Urinalysis with many RBCs     CT abdomen and pelvis with mostly nonacute findings but showing marked enlargement of prostate.... Please review complete report under radiographic studies     The emergency room provider spoke with urologist on-call who recommended placing Foley to prevent retraumatized and prostate with self caths,, controlling INR and trending H&H and will follow     Patient received vitamin K 5 mg orally in the ED.  Given a dose of Rocephin for possible UTI.  Hospitalist consulted for admission."      SLP Plan  All goals met      Recommendations for follow up therapy are one component of a multi-disciplinary discharge planning process, led by the attending physician.  Recommendations may be updated based on patient status, additional functional criteria and insurance authorization.    Recommendations  Diet recommendations: Dysphagia 3 (mechanical soft);Thin liquid Liquids provided via: Cup;Straw Medication Administration: Whole meds with puree (vs crushed with puree) Supervision: Patient able to self feed;Intermittent supervision to cue for compensatory strategies (review safe swallowing strategies with pt prior to POs) Compensations: Minimize environmental distractions;Slow rate;Small sips/bites;Follow solids with liquid Postural Changes and/or Swallow Maneuvers: Out of bed for meals;Seated upright 90 degrees;Upright 30-60 min after meal                Oral Care Recommendations: Oral care QID Follow Up Recommendations: No SLP follow up Assistance recommended at discharge:  (defer to OT/PT) SLP Visit Diagnosis: Dysphagia, oral  phase (R13.11) Plan: All goals met          Cherrie Gauze, M.S., [facility] [phone] [place])   Quintella Baton  11/26/2021, 10:17 AM

## 2021-11-27 ENCOUNTER — Encounter: Payer: Self-pay | Admitting: Internal Medicine

## 2021-11-27 DIAGNOSIS — N179 Acute kidney failure, unspecified: Secondary | ICD-10-CM | POA: Diagnosis not present

## 2021-11-27 DIAGNOSIS — N390 Urinary tract infection, site not specified: Secondary | ICD-10-CM

## 2021-11-27 DIAGNOSIS — D500 Iron deficiency anemia secondary to blood loss (chronic): Secondary | ICD-10-CM

## 2021-11-27 DIAGNOSIS — R310 Gross hematuria: Secondary | ICD-10-CM | POA: Diagnosis not present

## 2021-11-27 DIAGNOSIS — R319 Hematuria, unspecified: Secondary | ICD-10-CM

## 2021-11-27 DIAGNOSIS — N3091 Cystitis, unspecified with hematuria: Secondary | ICD-10-CM

## 2021-11-27 LAB — CBC
HCT: 37.1 % — ABNORMAL LOW (ref 39.0–52.0)
Hemoglobin: 11.5 g/dL — ABNORMAL LOW (ref 13.0–17.0)
MCH: 28.3 pg (ref 26.0–34.0)
MCHC: 31 g/dL (ref 30.0–36.0)
MCV: 91.4 fL (ref 80.0–100.0)
Platelets: 240 10*3/uL (ref 150–400)
RBC: 4.06 MIL/uL — ABNORMAL LOW (ref 4.22–5.81)
RDW: 18 % — ABNORMAL HIGH (ref 11.5–15.5)
WBC: 5.4 10*3/uL (ref 4.0–10.5)
nRBC: 0.7 % — ABNORMAL HIGH (ref 0.0–0.2)

## 2021-11-27 LAB — BASIC METABOLIC PANEL
Anion gap: 12 (ref 5–15)
BUN: 78 mg/dL — ABNORMAL HIGH (ref 8–23)
CO2: 32 mmol/L (ref 22–32)
Calcium: 9 mg/dL (ref 8.9–10.3)
Chloride: 88 mmol/L — ABNORMAL LOW (ref 98–111)
Creatinine, Ser: 2.66 mg/dL — ABNORMAL HIGH (ref 0.61–1.24)
GFR, Estimated: 24 mL/min — ABNORMAL LOW (ref >60)
Glucose, Bld: 94 mg/dL (ref 70–99)
Potassium: 4.2 mmol/L (ref 3.5–5.1)
Sodium: 132 mmol/L — ABNORMAL LOW (ref 135–145)

## 2021-11-27 LAB — PROTIME-INR
INR: 2.6 — ABNORMAL HIGH (ref 0.8–1.2)
Prothrombin Time: 28.2 seconds — ABNORMAL HIGH (ref 11.4–15.2)

## 2021-11-27 MED ORDER — FINASTERIDE 5 MG PO TABS: 5.0000 mg | ORAL_TABLET | Freq: Every day | ORAL | 0 refills | Status: AC

## 2021-11-27 MED ORDER — CIPROFLOXACIN HCL 500 MG PO TABS: 500.0000 mg | ORAL_TABLET | Freq: Every day | ORAL | 0 refills | Status: AC

## 2021-11-27 MED ORDER — MIDODRINE HCL 5 MG PO TABS: 5.0000 mg | ORAL_TABLET | Freq: Three times a day (TID) | ORAL | 0 refills | Status: AC

## 2021-11-27 NOTE — Care Management Important Message (Signed)
Important Message  Patient Details  Name: Adrian Chavez MRN: [id number] Date of Birth: February 21, 1941   Medicare Important Message Given:  Yes     Juliann Pulse A Gaston Dase 11/27/2021, 11:01 AM

## 2021-11-27 NOTE — Progress Notes (Addendum)
[facility]) Hospital Liaison Note  Received request from Transitions of Care Manager Bronson Ing, RN, for hospice services at home after discharge. Chart and patient information reviewed by [facility] physician. Hospice eligibility confirmed.   Spoke with wife Vaughan Basta and son Vonna Kotyk to initiate education related to hospice philosophy, services and team approach to care. Patient/family verbalized understanding of information provided. Per discussion, the plan for discharge is once DME is delivered.  DME needs discussed. Patient has the following equipment in the home: oxygen, rolling walker, w/c. Patient/family requests the following equipment for delivery: hospital bed, OBT, BSC. Address has been verified and is correct in the chart. Vaughan Basta is the family contact to arrange time of equipment delivery.   Please send signed and completed DNR home with patient/family. Please provide prescriptions at discharge as needed to ensure ongoing symptom management.   [facility] information and contact numbers given to family. Above information shared with [place].   Please do not hesitate to call with any hospice related questions or concerns.   Thank you for the opportunity to participate in this patient's care.   Nadene Rubins, RN, BSN [facility] Liaison [phone]

## 2021-11-27 NOTE — Progress Notes (Signed)
Urology Consult Follow Up  Subjective: Patient states his catheter became obstructed last evening and had to be irrigated by nursing staff.  He states that it is draining well now and it is a clear dark pink.  I communicated with nursing staff and they flushed it with 30 to 60 cc of urine just to get the catheter flowing again.  VSS afebrile.    His serum creatinine is 2.66 this morning down from 3.08 yesterday.  His hemoglobin is 11.5 which is down from 12.0 yesterday and his hematocrit is 37.1 this morning down from 38.8 yesterday.  His INR is now down to 2.6 and his prothrombin time is 28.2.  He is currently on Cipro 500 mg twice daily for positive urine culture of Klebsiella and Pseudomonas.   Anti-infectives: Anti-infectives (From admission, onward)    Start     Dose/Rate Route Frequency Ordered Stop   11/27/21 1200  ciprofloxacin (CIPRO) tablet 500 mg        500 mg Oral Daily with breakfast 11/26/21 0951     11/27/21 0000  ciprofloxacin (CIPRO) 500 MG tablet        500 mg Oral Daily with breakfast 11/27/21 0846 12/21/21 2359   11/24/21 1900  cefTRIAXone (ROCEPHIN) 1 g in sodium chloride 0.9 % 100 mL IVPB  Status:  Discontinued        1 g 200 mL/hr over 30 Minutes Intravenous Every 24 hours 11/24/21 1730 11/26/21 0951   11/23/21 1700  cefTRIAXone (ROCEPHIN) 1 g in sodium chloride 0.9 % 100 mL IVPB        1 g 200 mL/hr over 30 Minutes Intravenous  Once 11/23/21 1653 11/23/21 1814       Current Facility-Administered Medications  Medication Dose Route Frequency Provider Last Rate Last Admin   acetaminophen (TYLENOL) tablet 650 mg  650 mg Oral Q6H PRN Athena Masse, MD       Or   acetaminophen (TYLENOL) suppository 650 mg  650 mg Rectal Q6H PRN Athena Masse, MD       albuterol (PROVENTIL) (2.5 MG/3ML) 0.083% nebulizer solution 3 mL  3 mL Inhalation Q4H PRN Athena Masse, MD       ALPRAZolam Duanne Moron) tablet 0.5 mg  0.5 mg Oral Daily PRN Judd Gaudier V, MD   0.5 mg at  11/27/21 0412   chlorhexidine (PERIDEX) 0.12 % solution 15 mL  15 mL Mouth Rinse BID Max Sane, MD   15 mL at 11/26/21 2202   Chlorhexidine Gluconate Cloth 2 % PADS 6 each  6 each Topical Daily Athena Masse, MD   6 each at 11/26/21 0949   ciprofloxacin (CIPRO) tablet 500 mg  500 mg Oral Q breakfast Max Sane, MD       fluticasone (FLONASE) 50 MCG/ACT nasal spray 2 spray  2 spray Each Nare Daily PRN Athena Masse, MD       guaiFENesin (MUCINEX) 12 hr tablet 600 mg  600 mg Oral Daily Judd Gaudier V, MD   600 mg at 11/26/21 0948   MEDLINE mouth rinse  15 mL Mouth Rinse q12n4p Max Sane, MD   15 mL at 11/26/21 1520   midodrine (PROAMATINE) tablet 5 mg  5 mg Oral TID WC Max Sane, MD   5 mg at 11/26/21 1629   ondansetron (ZOFRAN) tablet 4 mg  4 mg Oral Q6H PRN Athena Masse, MD       Or   ondansetron [facility]) injection 4 mg  4  mg Intravenous Q6H PRN Athena Masse, MD       sertraline (ZOLOFT) tablet 25 mg  25 mg Oral Daily Athena Masse, MD   25 mg at 11/26/21 3254   vitamin B-12 (CYANOCOBALAMIN) tablet 1,000 mcg  1,000 mcg Oral Daily Athena Masse, MD   1,000 mcg at 11/26/21 0948     Objective: Vital signs in last 24 hours: Temp:  [97.4 F (36.3 C)-97.8 F (36.6 C)] 97.5 F (36.4 C) (01/24 0607) Pulse Rate:  [60-80] 79 (01/24 0607) Resp:  [14-20] 14 (01/24 0607) BP: (92-106)/(65-78) 92/74 (01/24 0607) SpO2:  [91 %-93 %] 93 % (01/24 0607)  Intake/Output from previous day: 01/23 0701 - 01/24 0700 In: 720 [P.O.:720] Out: 750 [Urine:750] Intake/Output this shift: No intake/output data recorded.   Physical Exam Constitutional:  Well nourished. Alert and oriented, No acute distress. HEENT: [place] AT, dry mucus membranes.  Trachea midline Cardiovascular: No clubbing, cyanosis, or edema. Respiratory: Normal respiratory effort, no increased work of breathing. GU: No CVA tenderness.  No bladder fullness or masses.  Patient with uncircumcised phallus. Foreskin easily  retracted  Foley in place.   Neurologic: Grossly intact, no focal deficits, moving all 4 extremities. Psychiatric: Normal mood and affect.   Lab Results:  Recent Labs    11/26/21 0506 11/27/21 0446  WBC 5.7 5.4  HGB 12.0* 11.5*  HCT 38.8* 37.1*  PLT 233 240   BMET Recent Labs    11/26/21 0506 11/27/21 0446  NA 135 132*  K 4.5 4.2  CL 88* 88*  CO2 35* 32  GLUCOSE 88 94  BUN 76* 78*  CREATININE 3.08* 2.66*  CALCIUM 8.9 9.0   PT/INR Recent Labs    11/26/21 0946 11/27/21 0446  LABPROT 48.8* 28.2*  INR 5.3* 2.6*   ABG No results for input(s): PHART, HCO3 in the last 72 hours.  Invalid input(s): PCO2, PO2  Studies/Results: No results found.  Since patient stated that he needed to be irrigated last evening by nursing staff due to a clogged catheter, I went ahead and irrigated him this morning to assess for clot burden.  I flushed him with 1 L of normal saline with a return of 10 cc of old clot material and urine was a light pink.  Assessment: 81 year old male with history of BPH and urinary retention who was admitted for gross hematuria and elevated prothrombin and INR, UTI, AKI and acute blood loss anemia.  Hemoglobin hematocrit mostly stable.  Serum creatinine improved.    Plan: -Hand irrigate with normal saline as needed for clots -INR is approaching therapeutic levels so hopefully hematuria will continue to improve -Continue Cipro 500 mg twice daily for Pseudomonas and Klebsiella UTI -Patient had managed urinary retention at home with intermittent self-catheterization, but at this time he is not able to resume self-catheterization, so Foley catheter will remain in place until hematuria clears and patient is stable enough to perform self-catheterization -We will continue to follow     LOS: 4 days    [facility] 11/27/2021

## 2021-11-27 NOTE — Final Progress Note (Signed)
Hospital bed may not get delivered today in time so family requesting D/C tomorrow.  Patient to go home with Hospice

## 2021-11-27 NOTE — Progress Notes (Signed)
[facility]) Hospital Liaison Note  Notified by Bronson Ing, RN [facility] manager of patient/family request for [facility] Palliative services at home after discharge.  [facility] hospital liaison will follow patient for discharge disposition.  Please call with any hospice or outpatient palliative care related questions.  Thank you for the opportunity to participate in this patient's care.  Nadene Rubins, RN, BSN [place] [phone]

## 2021-11-27 NOTE — TOC Progression Note (Addendum)
Transition of Care [facility]) - Progression Note    Patient Details  Name: Adrian Chavez MRN: [id number] Date of Birth: [birth year]  Transition of Care [facility]) CM/SW [place], RN Phone Number: 11/27/2021, 8:53 AM  Clinical Narrative:   The patient lives ay home with his wife, [facility] to follow outpatient, Will need EMS to transport, he has [place] for [facility] RN and PT set up, He has a rolling walker at home but needs a 3 in 1, [employer] will deliver to the room, he has oxygen at home he uses mainly at night, no additional needs   Update, the patient will be followed by [place] care at home, Will need a Hospital Bed at home, The wife stated that they can not take him home until all DME is at the home set up, The Bed is scheduled for delivery but not likely today EMS to be set up wto transport home once all is set up       Expected Discharge Plan and Services           Expected Discharge Date: 11/27/21                                     Social Determinants of Health (SDOH) Interventions    Readmission Risk Interventions Readmission Risk Prevention Plan 11/08/2021  Transportation Screening Complete  PCP or Specialist Appt within 5-7 Days Complete  Home Care Screening Complete  Medication Review (RN CM) Complete  Some recent data might be hidden

## 2021-11-27 NOTE — Progress Notes (Signed)
[facility]  ROUNDING NOTE   Subjective:   Adrian Chavez is a 81 y.o. male with past medical concerns including A-Fib on Coumadin, hypertension. 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 Patients presents to ED with complaints of bloody urine. He has been admitted for Hemorrhagic cystitis [N30.91] Gross hematuria [R31.0] Supratherapeutic INR [R79.1] Hematuria, unspecified type [R31.9]   Patient is known to our clinic and receives outpatient nephrology care with Dr Holley Raring.  . Patient seen sitting up in bed, drowsy Breakfast tray at bedside, untouched Denies nausea and vomiting Denies shortness of breath, remains on 5 L [place] Denies pain and discomfort No edema  Creatinine 2.6. Recorded urine output of 750 mL in 24 hours  Objective:  Vital signs in last 24 hours:  Temp:  [97.4 F (36.3 C)-98.3 F (36.8 C)] 97.5 F (36.4 C) (01/24 1229) Pulse Rate:  [60-80] 80 (01/24 1229) Resp:  [14-20] 18 (01/24 1229) BP: (92-106)/(65-80) 96/75 (01/24 1229) SpO2:  [91 %-96 %] 96 % (01/24 1229)  Weight change:  Filed Weights   11/24/21 1729  Weight: 83.1 kg    Intake/Output: I/O last 3 completed shifts: In: 1651.5 [P.O.:920; I.V.:731.5] Out: 1050 [Urine:1050]   Intake/Output this shift:  Total I/O In: 240 [P.O.:240] Out: 500 [Urine:500]  Physical Exam: General: NAD  Head: Normocephalic, atraumatic. Moist oral mucosal membranes  Eyes: Anicteric  Lungs:  Clear to auscultation, normal effort, O2 5 L [place]  Heart: Regular rate and rhythm  Abdomen:  Soft, nontender  Extremities:  no peripheral edema.  Neurologic: Nonfocal, moving all four extremities  Skin: No lesions  GU  Foley catheter-hematuria    Basic Metabolic Panel: Recent Labs  Lab 11/23/21 1246 11/24/21 0355 11/25/21 0439 11/26/21 0506 11/27/21 0446  NA 133* 134* 133* 135 132*  K 2.7* 3.8 4.4 4.5 4.2  CL 83* 88* 88*  88* 88*  CO2 39* 36* 32 35* 32  GLUCOSE 110* 117* 79 88 94  BUN 46* 51* 63* 76* 78*  CREATININE 2.22* 2.35* 2.49* 3.08* 2.66*  CALCIUM 8.7* 8.5* 8.7* 8.9 9.0     Liver Function Tests: Recent Labs  Lab 11/23/21 1246  AST 33  ALT 24  ALKPHOS 68  BILITOT 3.9*  PROT 6.2*  ALBUMIN 3.5    Recent Labs  Lab 11/23/21 1246  LIPASE 45    No results for input(s): AMMONIA in the last 168 hours.  CBC: Recent Labs  Lab 11/23/21 1246 11/23/21 2042 11/24/21 0355 11/25/21 0439 11/26/21 0506 11/27/21 0446  WBC 5.2  --   --  6.7 5.7 5.4  HGB 12.8* 10.6* 12.3* 12.3* 12.0* 11.5*  HCT 41.7 33.6* 39.2 39.3 38.8* 37.1*  MCV 93.7  --   --  92.3 93.9 91.4  PLT 151  --   --  214 233 240     Cardiac Enzymes: No results for input(s): CKTOTAL, CKMB, CKMBINDEX, TROPONINI in the last 168 hours.  BNP: Invalid input(s): POCBNP  CBG: No results for input(s): GLUCAP in the last 168 hours.  Microbiology: Results for orders placed or performed during the hospital encounter of 11/23/21  Urine Culture     Status: Abnormal   Collection Time: 11/23/21  4:53 PM   Specimen: Urine, Random  Result Value Ref Range Status   Specimen Description   Final    URINE, RANDOM Performed at [facility], [address], [place],  [address]    Special Requests  Final    NONE Performed at [facility], [place], [place] [address]    Culture (A)  Final    80,000 COLONIES/mL KLEBSIELLA OXYTOCA 40,000 COLONIES/mL PSEUDOMONAS AERUGINOSA    Report Status 11/26/2021 FINAL  Final   Organism ID, Bacteria KLEBSIELLA OXYTOCA (A)  Final   Organism ID, Bacteria PSEUDOMONAS AERUGINOSA (A)  Final      Susceptibility   Klebsiella oxytoca - MIC*    AMPICILLIN >=32 RESISTANT Resistant     CEFAZOLIN 8 SENSITIVE Sensitive     CEFEPIME <=0.12 SENSITIVE Sensitive     CEFTRIAXONE <=0.25 SENSITIVE Sensitive     CIPROFLOXACIN <=0.25 SENSITIVE Sensitive     GENTAMICIN <=1  SENSITIVE Sensitive     IMIPENEM <=0.25 SENSITIVE Sensitive     NITROFURANTOIN <=16 SENSITIVE Sensitive     TRIMETH/SULFA <=20 SENSITIVE Sensitive     AMPICILLIN/SULBACTAM 16 INTERMEDIATE Intermediate     PIP/TAZO <=4 SENSITIVE Sensitive     * 80,000 COLONIES/mL KLEBSIELLA OXYTOCA   Pseudomonas aeruginosa - MIC*    CEFTAZIDIME 4 SENSITIVE Sensitive     CIPROFLOXACIN <=0.25 SENSITIVE Sensitive     GENTAMICIN <=1 SENSITIVE Sensitive     IMIPENEM 1 SENSITIVE Sensitive     PIP/TAZO 8 SENSITIVE Sensitive     CEFEPIME 2 SENSITIVE Sensitive     * 40,000 COLONIES/mL PSEUDOMONAS AERUGINOSA  Resp Panel by RT-PCR (Flu A&B, Covid) Nasopharyngeal Swab     Status: None   Collection Time: 11/23/21  4:54 PM   Specimen: Nasopharyngeal Swab; Nasopharyngeal(NP) swabs in vial transport medium  Result Value Ref Range Status   SARS Coronavirus 2 by RT PCR NEGATIVE NEGATIVE Final    Comment: (NOTE) SARS-CoV-2 target nucleic acids are NOT DETECTED.  The SARS-CoV-2 RNA is generally detectable in upper respiratory specimens during the acute phase of infection. The lowest concentration of SARS-CoV-2 viral copies this assay can detect is 138 copies/mL. A negative result does not preclude SARS-Cov-2 infection and should not be used as the sole basis for treatment or other patient management decisions. A negative result may occur with  improper specimen collection/handling, submission of specimen other than nasopharyngeal swab, presence of viral mutation(s) within the areas targeted by this assay, and inadequate number of viral copies(<138 copies/mL). A negative result must be combined with clinical observations, patient history, and epidemiological information. The expected result is Negative.  Fact Sheet for Patients:  EntrepreneurPulse.com.au  Fact Sheet for Healthcare Providers:  IncredibleEmployment.be  This test is no t yet approved or cleared by the [place]  FDA and  has been authorized for detection and/or diagnosis of SARS-CoV-2 by FDA under an Emergency Use Authorization (EUA). This EUA will remain  in effect (meaning this test can be used) for the duration of the COVID-19 declaration under Section 564(b)(1) of the Act, 21 U.S.C.section 360bbb-3(b)(1), unless the authorization is terminated  or revoked sooner.       Influenza A by PCR NEGATIVE NEGATIVE Final   Influenza B by PCR NEGATIVE NEGATIVE Final    Comment: (NOTE) The Xpert Xpress SARS-CoV-2/FLU/RSV plus assay is intended as an aid in the diagnosis of influenza from Nasopharyngeal swab specimens and should not be used as a sole basis for treatment. Nasal washings and aspirates are unacceptable for Xpert Xpress SARS-CoV-2/FLU/RSV testing.  Fact Sheet for Patients: EntrepreneurPulse.com.au  Fact Sheet for Healthcare Providers: IncredibleEmployment.be  This test is not yet approved or cleared by the [place] FDA and has been authorized for detection and/or diagnosis  of SARS-CoV-2 by FDA under an Emergency Use Authorization (EUA). This EUA will remain in effect (meaning this test can be used) for the duration of the COVID-19 declaration under Section 564(b)(1) of the Act, 21 U.S.C. section 360bbb-3(b)(1), unless the authorization is terminated or revoked.  Performed at [facility], [address] [place], [place] [address]     Coagulation Studies: Recent Labs    11/24/21 1805 11/25/21 0439 11/25/21 1321 11/26/21 0946 11/27/21 0446  LABPROT 48.6* 53.4* 48.3* 48.8* 28.2*  INR 5.3* 6.0* 5.3* 5.3* 2.6*     Urinalysis: No results for input(s): COLORURINE, LABSPEC, PHURINE, GLUCOSEU, HGBUR, BILIRUBINUR, KETONESUR, PROTEINUR, UROBILINOGEN, NITRITE, LEUKOCYTESUR in the last 72 hours.  Invalid input(s): APPERANCEUR     Imaging: No results found.   Medications:      chlorhexidine  15 mL Mouth Rinse BID    Chlorhexidine Gluconate Cloth  6 each Topical Daily   ciprofloxacin  500 mg Oral Q breakfast   guaiFENesin  600 mg Oral Daily   mouth rinse  15 mL Mouth Rinse q12n4p   midodrine  5 mg Oral TID WC   sertraline  25 mg Oral Daily   vitamin B-12  1,000 mcg Oral Daily   acetaminophen **OR** acetaminophen, albuterol, ALPRAZolam, fluticasone, ondansetron **OR** ondansetron (ZOFRAN) IV  Assessment/ Plan:  Mr. Adrian Chavez is a 81 y.o.  male with past medical concerns including A-Fib on Coumadin, hypertension. 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 Patients presents to ED with complaints of bloody urine. He has been admitted for Hemorrhagic cystitis [N30.91] Gross hematuria [R31.0] Supratherapeutic INR [R79.1] Hematuria, unspecified type [R31.9]   Acute Kidney Injury on chronic kidney disease stage IIIb with baseline creatinine 1.59 and GFR of 44 on 11/08/21.  Acute kidney injury secondary to hematuria from traumatic foley insertion Chronic kidney disease is secondary to hypertension and CHF CT abdomen and pelvis show enlarged prostate. CT renal stone negative for obstruction.  Creatinine continues to improve Foley in place and hematuria improving also. Palliative care consulted for goals of care discussion.   Lab Results  Component Value Date   CREATININE 2.66 (H) 11/27/2021   CREATININE 3.08 (H) 11/26/2021   CREATININE 2.49 (H) 11/25/2021    Intake/Output Summary (Last 24 hours) at 11/27/2021 1335 Last data filed at 11/27/2021 1100 Gross per 24 hour  Intake 240 ml  Output 1250 ml  Net -1010 ml    2. Hypertension with chronic kidney disease. Home regimen includes Carvedilol and torsemide. All currently held due to hypotension. Receiving Midodrine.  BP currently 96/75  3. BPH with urinary retention. Urology replaced foley in ED with 20 french catheter. Recommending manual irrigation to manage  clots.  Urine culture positive for Klebsiella and Pseudomonas    LOS: 4 Bruce Churilla 1/24/20231:35 PM

## 2021-11-27 NOTE — Progress Notes (Addendum)
Daily Progress Note   Patient Name: Adrian Chavez       Date: 11/27/2021 DOB: [birth year]  Age: 81 y.o. MRN#: [id number] Attending Physician: Max Sane, MD Primary Care Physician: Dion Body, MD Admit Date: 11/23/2021  Reason for Consultation/Follow-up: Establishing goals of care  Subjective: Patient is resting in bed. His wife is at bedside. She states until last admission, he was still working, and doing well. She states with last admission, he became weak and frail. He did not improve very much before this hospitalization, and now he is currently weak. He rests with his eyes closed, and opens them briefly and answer a question if you speak to him. She states she will not be able to care for him at home as she has had a stroke herself affecting her left side.   Wife tells me he has been told multiple times that "he has lived past his expiration" and has been told his prognosis was limited for years. She states they have had multiple times where he did not do well, but then bounced back.   She shares that a year ago, cardiology told him his EF was 14%. We discussed his status and his status since shortly before his last admission.   We discussed his diagnosis, prognosis, GOC, EOL wishes disposition and options.  Created space and opportunity for patient  to explore thoughts and feelings regarding current medical information.   A detailed discussion was had today regarding advanced directives.  Concepts specific to code status, artifical feeding and hydration, IV antibiotics and rehospitalization were discussed.  The difference between an aggressive medical intervention path and a comfort care path was discussed.  Values and goals of care important to patient and family were attempted to be  elicited.  Discussed limitations of medical interventions to prolong quality of life in some situations and discussed the concept of human mortality.  Patient is clear he does not wish to return to the hospital, and wants to stay at home for what time he has left on earth. He would not ever want to live in a facility. Wife is amenable to this. She states she will need to hire help at home. They would like hospice at home.     Length of Stay: 4  Current Medications: Scheduled Meds:   chlorhexidine  15 mL Mouth Rinse BID   Chlorhexidine Gluconate Cloth  6 each Topical Daily   ciprofloxacin  500 mg Oral Q breakfast   guaiFENesin  600 mg Oral Daily   mouth rinse  15 mL Mouth Rinse q12n4p   midodrine  5 mg Oral TID WC   sertraline  25 mg Oral Daily   vitamin B-12  1,000 mcg Oral Daily    Continuous Infusions:   PRN Meds: acetaminophen **OR** acetaminophen, albuterol, ALPRAZolam, fluticasone, ondansetron **OR** ondansetron (ZOFRAN) IV  Physical Exam Pulmonary:     Effort: Pulmonary effort is normal.  Genitourinary:    Comments: Foley in place with hematuria.  Neurological:     Mental Status: He is alert.            Vital Signs: BP 96/75 (BP Location: Right Arm)    Pulse 80    Temp (!) 97.5 F (36.4 C)    Resp 18    Ht 5' 7.99" (1.727 m)    Wt 83.1 kg    SpO2 96%    BMI 27.86 kg/m  SpO2: SpO2: 96 % O2 Device: O2 Device: Nasal Cannula O2 Flow Rate: O2 Flow Rate (L/min): 5 L/min  Intake/output summary:  Intake/Output Summary (Last 24 hours) at 11/27/2021 1236 Last data filed at 11/27/2021 1100 Gross per 24 hour  Intake 480 ml  Output 1250 ml  Net -770 ml   LBM: Last BM Date: 11/24/21 Baseline Weight: Weight: 83.1 kg Most recent weight: Weight: 83.1 kg        Patient Active Problem List   Diagnosis Date Noted   Hematuria    Hemorrhagic cystitis    Gross hematuria 11/23/2021   Supratherapeutic INR 11/23/2021   Chronic respiratory failure with hypoxia and hypercapnia  (HCC) 11/23/2021   Hypokalemia 00/86/7619   Complicated UTI (urinary tract infection) 11/23/2021   ABLA (acute blood loss anemia) 11/23/2021   Intermittent self-catheterization of bladder 11/23/2021   Hyperbilirubinemia 11/23/2021   Pulmonary hypertension ([place]) 11/03/2021   Moderate to severe mitral regurgitation 11/03/2021   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[place]) 11/03/2021   OSA (obstructive sleep apnea) 11/03/2021   Acute on chronic respiratory failure with hypoxia and hypercapnia (HCC) 10/31/2021   Acute kidney injury superimposed on CKD lllb ([place]) 03/06/2021   NICM (nonischemic cardiomyopathy) ([place]) 03/05/2021   Warfarin anticoagulation 03/05/2021   Chronic combined systolic and diastolic CHF (congestive heart failure) ([place]) 02/26/2018   Essential hypertension 05/17/2014   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      Code Status:    Code Status Orders  (From admission, onward)           Start     Ordered   11/23/21 1956  Do not attempt resuscitation (DNR)  Continuous       Question Answer Comment  In the event of cardiac or respiratory ARREST Do not call a code blue   In the event of cardiac or respiratory ARREST Do not perform Intubation, CPR, defibrillation or ACLS   In the event of cardiac or respiratory ARREST Use medication by any route, position, wound care, and other measures to relive pain and suffering. May use oxygen, suction and manual treatment of airway obstruction as needed for comfort.      11/23/21 1955  Code Status History     Date Active Date Inactive Code Status Order ID Comments User Context   10/31/2021 2303 11/08/2021 2146 DNR [id number]  Lang Snow, NP ED   10/31/2021  2226 10/31/2021 2303 Full Code [id number]  Lang Snow, NP ED   03/06/2021 1900 03/07/2021 1849 Full Code [id number]  [place] Eric J, DO Inpatient   03/05/2021 2052 03/06/2021 1900 DNR [id number]  Athena Masse, MD ED   03/05/2021 1950 03/05/2021 2052 Full Code [id number]  Athena Masse, MD ED      Advance Directive Documentation    [place] Most Recent Value  Type of Advance Directive Living will, Healthcare Power of Attorney  Pre-existing out of facility DNR order (yellow form or pink MOST form) --  "MOST" Form in Place? --       Prognosis:  < 6 months  Care plan was discussed with RN and TOC, message to attending  Thank you for allowing the Palliative Medicine Team to assist in the care of this patient.   Time In: 10:40 Time Out: 11:30 Total Time 50 min Prolonged Time Billed  no       Greater than 50%  of this time was spent counseling and coordinating care related to the above assessment and plan.  Asencion Gowda, NP  Please contact Palliative Medicine Team phone at [phone] for questions and concerns.

## 2021-11-27 NOTE — Discharge Summary (Signed)
Physician Discharge Summary   Patient: Adrian Chavez MRN: [id number] DOB: 1941/08/07  Admit date:     11/23/2021  Discharge date:   Discharge Physician: Max Sane   PCP: Dion Body, MD   Recommendations at discharge:    Hospice at home  Discharge Diagnoses Principal Problem:   Gross hematuria Active Problems:   Benign prostatic hyperplasia with lower urinary tract symptoms   AF (paroxysmal atrial fibrillation) (HCC)   Chronic combined systolic and diastolic CHF (congestive heart failure) (HCC)   Essential hypertension   ICD (implantable cardioverter-defibrillator) in place   NICM (nonischemic cardiomyopathy) ([place])   Warfarin anticoagulation   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 [facility] Course   81 y.o. 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  1/21: Urology recommends continuing Foley and irrigation. 1/22 - still blood in foley but no clots 1/23 - urine c/s growing pseudomonas - Abx changed to PO cipro. Foley with minimal blood 1/24 - patient was scheduled to go home with hospice but DME/Hospital bed couldn't be delivered today so likely will stay overnight     * Gross hematuria- (present on admission) Urology replaced Foley with  20 French coud catheter. Urine is now darker but not much blood.  Hyperbilirubinemia- (present on admission) Bilirubin 3.9 (2.9) likely due to hepatic congestion.  Monitor for now  ABLA (acute blood loss anemia)- (present on admission) Due to hematuria.  Hemoglobin stable for now  Complicated UTI (urinary tract infection)- (present on admission) Urine c/s growing pseudomonas and Klebsiella - change Abx to Cipro    Hypokalemia- (present on admission) Repleted and resolved  Chronic respiratory failure with hypoxia and hypercapnia ([place])- (present on admission) At baseline uses 2 L oxygen. Same now  Supratherapeutic INR- (present on admission) INR 6->5.3.  Given total 10 mg vitamin K thus far. 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  Foley changed to 20 [place] coud catheter by urologist and draining bloody urine.Nephrology following. Renal function worsening some - could be due to hematuria  Lab Results  Component Value Date   CREATININE 3.08 (H) 11/26/2021   CREATININE 2.49 (H) 11/25/2021   CREATININE 2.35 (H) 11/24/2021    Warfarin anticoagulation Holding warfarin due to hematuria.  INR  6-> 5.3.  Recheck INR tomorrow  NICM (nonischemic cardiomyopathy) (HCC) Chronic combined systolic and diastolic CHF.  ICD in place.  EF less than 20%.  ICD (implantable cardioverter-defibrillator) in place- (present on admission) Per his wife who is at bedside his ICD has not fired  Essential hypertension- (present on admission) Hypotensive -.  Holding all antihypertensive and using midodrine for now  Chronic combined systolic and diastolic CHF (congestive heart failure) ([place])- (present on admission) Unable to use  any diuretic considering hypotension. continue midodrine  AF (paroxysmal atrial fibrillation) ([place])- (present on admission) Holding amiodarone and Coreg due to  hypotension.  Holding warfarin due to hematuria. INR still 5.3. will give Vit K 10 mg  Benign prostatic hyperplasia with lower urinary tract symptoms- (present on admission) Continue Foley, finesteride      Consultants: Urology Disposition: Home with Hospice Diet recommendation: Regular diet  DISCHARGE MEDICATION: Allergies as of 11/27/2021       Reactions   Escitalopram Oxalate Nausea Only   Oxycodone    vomiting        Medication List     STOP taking these medications    amiodarone 200 MG tablet Commonly known as: PACERONE   carvedilol 3.125 MG tablet Commonly known as: COREG   Cialis 5 MG tablet Generic drug: tadalafil   metolazone 5 MG tablet Commonly known as: ZAROXOLYN   potassium chloride SA 20 MEQ tablet Commonly known as: KLOR-CON M   torsemide 20 MG tablet Commonly known as: DEMADEX   warfarin 1 MG tablet Commonly known as: COUMADIN       TAKE these medications    albuterol 108 (90 Base) MCG/ACT inhaler Commonly known as: VENTOLIN HFA SMARTSIG:1-2 Inhalation Via Inhaler Every 4 Hours PRN   ALPRAZolam 0.5 MG tablet Commonly known as: XANAX Take 0.5 mg by mouth daily as needed for anxiety.   ciprofloxacin 500 MG tablet Commonly known as: CIPRO Take 1 tablet (500 mg total) by mouth daily with breakfast for 5 days.   finasteride 5 MG tablet Commonly known as: Proscar Take 1 tablet (5 mg total) by mouth daily.   fluticasone 50 MCG/ACT nasal spray Commonly known as: FLONASE Place 2 sprays into both nostrils daily as needed for allergies or rhinitis.   guaiFENesin 600 MG 12 hr tablet Commonly known as: MUCINEX Take 600 mg by mouth daily.   midodrine 5 MG tablet Commonly known as: PROAMATINE Take 1 tablet (5 mg total) by mouth 3 (three) times daily with meals.   sertraline 25 MG tablet Commonly known as: ZOLOFT Take 25 mg by mouth daily.   vitamin B-12 1000 MCG tablet Commonly known as: CYANOCOBALAMIN Take 1,000 mcg by mouth  daily.        Follow-up Information     Dion Body, MD. Schedule an appointment as soon as possible for a visit in 1 week(s).   Specialty: Family Medicine Why: [facility] Discharge F/UP Contact information: [place] [address] [place], [place]. Schedule an appointment as soon as possible for a visit in 1 week(s).   Specialty: Urology Why: [facility] Discharge F/UP Contact information: [place] [phone]                 Discharge Exam: Danley Danker Weights   11/24/21 1729  Weight: 83.1 kg    Constitutional: chronically ill appearing, awake and alert. Not in any apparent distress HEENT:      Head: Normocephalic and atraumatic.         Eyes: PERLA, EOMI, Conjunctivae are normal. Sclera is non-icteric.       Mouth/Throat: Mucous membranes are moist.       Neck: Supple with no signs of meningismus. Cardiovascular: Regular rate and rhythm. No murmurs, gallops, or rubs. 2+ symmetrical distal pulses are present . No JVD. No  LE edema Respiratory: Respiratory effort normal .Lungs sounds  clear bilaterally. No wheezes, crackles, or rhonchi.  Gastrointestinal: Soft, non tender, non distended. Positive bowel sounds.  Genitourinary: Foley catheter in place draining dark urine Musculoskeletal: Nontender with normal range of motion in all extremities. No cyanosis, or erythema of extremities. Neurologic:  Face is symmetric. Moving all extremities. No gross focal neurologic deficits . Skin: Skin is warm, dry.  No rash or ulcers Psychiatric: normal mood and affect  Condition at discharge: poor  The results of significant diagnostics from this hospitalization (including imaging, microbiology, ancillary and laboratory) are listed below for reference.   Imaging Studies: DG Chest 1 View  Result Date: 11/02/2021 CLINICAL DATA:  81 year old male  with history of DVT. EXAM: CHEST  1 VIEW COMPARISON:  Chest x-ray 10/31/2021. FINDINGS: Lung volumes are normal. There is cephalization of the pulmonary vasculature and slight indistinctness of the interstitial markings suggestive of mild pulmonary edema. Trace left pleural effusion. No definite right pleural effusion. No pneumothorax. Moderate to severe cardiomegaly. The patient is rotated to the right on today's exam, resulting in distortion of the mediastinal contours and reduced diagnostic sensitivity and specificity for mediastinal pathology. Atherosclerotic calcifications in the thoracic aorta. Left-sided biventricular pacemaker/AICD with lead tips projecting over the expected location of the right atrium, right ventricle and lateral wall the left ventricle via the coronary sinus and coronary veins. IMPRESSION: 1. The appearance the chest suggest congestive heart failure, as above. Electronically Signed   By: Vinnie Langton M.D.   On: 11/02/2021 07:22   DG Abd 1 View  Result Date: 10/31/2021 CLINICAL DATA:  Abdominal pain. EXAM: ABDOMEN - 1 VIEW COMPARISON:  October 27, 2010 FINDINGS: Mild atelectatic changes are seen within the bilateral lung bases. The cardiac silhouette is markedly enlarged. The bowel gas pattern is normal. Multiple small radiopaque surgical coils are seen overlying the lower pelvis on the right. No radio-opaque calculi or other significant radiographic abnormality are seen. IMPRESSION: 1. Normal bowel gas pattern without evidence of renal calculi. 2. Mild bibasilar atelectasis. Electronically Signed   By: Virgina Norfolk M.D.   On: 10/31/2021 23:39   US Venous Img Upper Uni Left (DVT)  Result Date: 11/01/2021 CLINICAL DATA:  Bruising of left forearm status post fall 1 week ago Edema Pain EXAM: LEFT UPPER EXTREMITY VENOUS DOPPLER ULTRASOUND TECHNIQUE: Gray-scale sonography with graded compression, as well as color Doppler and duplex ultrasound were performed to evaluate the  upper extremity deep venous system from the level of the subclavian vein and including the jugular, axillary, basilic, radial, ulnar and upper cephalic vein. Spectral Doppler was utilized to evaluate flow at rest and with distal augmentation maneuvers. COMPARISON:  None. FINDINGS: Contralateral Subclavian Vein: Respiratory phasicity is normal and symmetric with the symptomatic side. No evidence of thrombus. Normal compressibility. Internal Jugular Vein: No evidence of thrombus. Normal compressibility, respiratory phasicity and response to augmentation. Subclavian Vein: No evidence of thrombus. Normal compressibility, respiratory phasicity and response to augmentation. Axillary Vein: No evidence of thrombus. Normal compressibility, respiratory phasicity and response to augmentation. Cephalic Vein: No evidence of thrombus. Normal compressibility, respiratory phasicity and response to augmentation. 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 Brachial Veins: No evidence of thrombus. Normal compressibility, respiratory phasicity and response to augmentation. Radial Veins: No evidence of thrombus. Normal compressibility, respiratory phasicity and response to augmentation. Ulnar Veins: No evidence of thrombus. Normal compressibility, respiratory phasicity and response to augmentation. Venous Reflux:  None visualized. Other Findings:  None visualized. IMPRESSION: 1. No left  lower extremity DVT. 2. Minimal partially occlusive web-like thrombus in the proximal left basilic vein is consistent with chronic superficial venous thrombosis. Electronically Signed   By: Miachel Roux M.D.   On: 11/01/2021 17:29   DG Chest Port 1 View  Result Date: 10/31/2021 CLINICAL DATA:  Questionable sepsis.  Evaluate for abnormality. EXAM: PORTABLE CHEST 1 VIEW COMPARISON:  03/05/2021 FINDINGS: Multi lead ICD is identified with battery pack in the left chest wall. Marked cardiac  enlargement is unchanged. There is blunting of the left costophrenic angle, new from previous exam. This may represent a small effusion. Diffuse pulmonary vascular congestion. No frank edema or airspace consolidation. Platelike atelectasis is noted in the right lung base. IMPRESSION: 1. Cardiac enlargement, small left pleural effusion and pulmonary vascular congestion. Correlate for any signs or symptoms of CHF. 2. Right base atelectasis. Electronically Signed   By: Kerby Moors M.D.   On: 10/31/2021 20:26   DG Shoulder Left Port  Result Date: 11/01/2021 CLINICAL DATA:  Left shoulder pain EXAM: LEFT SHOULDER COMPARISON:  None. FINDINGS: Mild AC joint degenerative change. Moderate glenohumeral degenerative change. No fracture or dislocation. Faint calcifications at the superolateral humeral head. IMPRESSION: 1. No acute osseous abnormality 2. Moderate degenerative changes with calcific tendinopathy. Electronically Signed   By: Donavan Foil M.D.   On: 11/01/2021 16:10   ECHOCARDIOGRAM COMPLETE  Result Date: 11/01/2021    ECHOCARDIOGRAM REPORT   Patient Name:   THEOPLIS GARCIAGARCIA Date of Exam: 11/01/2021 Medical Rec #:  [id number]    Height:       68.0 in Accession #:    [id number]   Weight:       184.7 lb Date of Birth:  [birth year]    BSA:          1.976 m Patient Age:    25 years     BP:           101/73 mmHg Patient Gender: M            HR:           78 bpm. Exam Location:  [facility] Procedure: 2D Echo, Cardiac Doppler and Color Doppler Indications:     CHF-acute diastolic L79.89                  CHF-acute systolic Q11.94  History:         Patient has no prior history of Echocardiogram examinations.                  CHF; Risk Factors:Hypertension.  Sonographer:     Sherrie Sport Referring Phys:  [id number] [facility] OUMA Diagnosing Phys: Donnelly Angelica  Sonographer Comments: Suboptimal apical window. IMPRESSIONS  1. Left ventricular ejection fraction, by estimation, is <20%. The left ventricle has severely decreased  function. The left ventricle demonstrates regional wall motion abnormalities (see scoring diagram/findings for description). The left ventricular internal cavity size was mildly dilated. Left ventricular diastolic parameters are indeterminate. There is severe hypokinesis of the left ventricular, entire inferior wall and inferolateral wall.  2. Right ventricular systolic function reduced. The right ventricular size is not well visualized.  3. Left atrial size was severely dilated.  4. Right atrial size was severely dilated.  5. The mitral valve is grossly normal. Moderate mitral valve regurgitation.  6. The aortic valve is normal in structure. Aortic valve regurgitation is not visualized. Aortic valve sclerosis is present, with no evidence of aortic valve stenosis.  7. Mild pulmonic stenosis. FINDINGS  Left Ventricle: Left ventricular ejection fraction, by estimation, is <20%. The left ventricle has severely decreased function. The left ventricle demonstrates regional wall motion abnormalities. Severe hypokinesis of the left ventricular, entire inferior wall and inferolateral wall. The left ventricular internal cavity size was mildly dilated. There is no left ventricular hypertrophy. Left ventricular diastolic parameters are indeterminate. Right Ventricle: The right ventricular size is not well visualized. Right vetricular wall thickness was not well visualized. Right ventricular systolic function reduced. Left Atrium: Left atrial size was severely dilated. Right Atrium: Right atrial size was severely dilated. Pericardium: There is no evidence of pericardial effusion. Mitral Valve: The mitral valve is grossly normal. Moderate mitral valve regurgitation. Tricuspid Valve: The tricuspid valve is normal in structure. Tricuspid valve regurgitation is not demonstrated. Aortic Valve: The aortic valve is normal in structure. Aortic valve regurgitation is not visualized. Aortic valve sclerosis is present, with no evidence of  aortic valve stenosis. Aortic valve mean gradient measures 2.0 mmHg. Aortic valve peak gradient measures 3.4 mmHg. Aortic valve area, by VTI measures 2.47 cm. Pulmonic Valve: The pulmonic valve was not well visualized. Pulmonic valve regurgitation is not visualized. Mild pulmonic stenosis. Aorta: The aortic root is normal in size and structure. Venous: The inferior vena cava was not well visualized. IAS/Shunts: The interatrial septum was not well visualized.  LEFT VENTRICLE PLAX 2D LVIDd:         6.20 cm LVIDs:         5.70 cm LV PW:         1.40 cm LV IVS:        0.95 cm LVOT diam:     2.10 cm LV SV:         32 LV SV Index:   16 LVOT Area:     3.46 cm  LV Volumes (MOD) LV vol d, MOD A4C: 328.0 ml LV vol s, MOD A4C: 219.0 ml LV SV MOD A4C:     328.0 ml RIGHT VENTRICLE RV Basal diam:  4.70 cm RV S prime:     9.68 cm/s TAPSE (M-mode): 4.2 cm LEFT ATRIUM              Index         RIGHT ATRIUM           Index LA diam:        8.30 cm  4.20 cm/m    RA Area:     50.50 cm LA Vol (A2C):   554.0 ml 280.37 ml/m  RA Volume:   230.00 ml 116.40 ml/m LA Vol (A4C):   339.0 ml 171.56 ml/m LA Biplane Vol: 439.0 ml 222.17 ml/m  AORTIC VALVE                    PULMONIC VALVE AV Area (Vmax):    2.39 cm     PV Vmax:        0.60 m/s AV Area (Vmean):   2.21 cm     PV Vmean:       37.450 cm/s AV Area (VTI):     2.47 cm     PV VTI:         0.081 m AV Vmax:           91.80 cm/s   PV Peak grad:   1.4 mmHg AV Vmean:          62.100 cm/s  PV Mean grad:   1.0 mmHg AV VTI:  0.130 m      RVOT Peak grad: 3 mmHg AV Peak Grad:      3.4 mmHg AV Mean Grad:      2.0 mmHg LVOT Vmax:         63.30 cm/s LVOT Vmean:        39.600 cm/s LVOT VTI:          0.093 m LVOT/AV VTI ratio: 0.71  AORTA Ao Root diam: 3.10 cm MITRAL VALVE                TRICUSPID VALVE MV Area (PHT): 4.93 cm     TR Peak grad:   16.8 mmHg MV Decel Time: 154 msec     TR Vmax:        205.00 cm/s MV E velocity: 111.00 cm/s                             SHUNTS                              Systemic VTI:  0.09 m                             Systemic Diam: 2.10 cm                             Pulmonic VTI:  0.130 m Donnelly Angelica Electronically signed by Donnelly Angelica Signature Date/Time: 11/01/2021/12:52:46 PM    Final    [place] LT UPPER EXTREM LTD SOFT TISSUE NON VASCULAR  Result Date: 11/01/2021 CLINICAL DATA:  Left forearm bruise status post fall 1 week ago EXAM: ULTRASOUND LEFT UPPER EXTREMITY LIMITED TECHNIQUE: Ultrasound examination of the upper extremity soft tissues was performed in the area of clinical concern. COMPARISON:  None. FINDINGS: Targeted sonographic evaluation of the left forearm demonstrates a 1.5 x 0.6 x 1.0 cm complex fluid collection which is likely a hematoma. IMPRESSION: 1.5 x 0.6 x 1.0 cm complex fluid collection in the left forearm is likely a small hematoma. If patient's symptoms worsen, repeat ultrasound evaluation should be performed. Electronically Signed   By: Miachel Roux M.D.   On: 11/01/2021 17:30   CT Renal Stone Study  Result Date: 11/23/2021 CLINICAL DATA:  Flank pain EXAM: CT ABDOMEN AND PELVIS WITHOUT CONTRAST TECHNIQUE: Multidetector CT imaging of the abdomen and pelvis was performed following the standard protocol without IV contrast. RADIATION DOSE REDUCTION: This exam was performed according to the departmental dose-optimization program which includes automated exposure control, adjustment of the mA and/or kV according to patient size and/or use of iterative reconstruction technique. COMPARISON:  03/05/2021 FINDINGS: Lower chest: Heart is markedly enlarged in size. Biventricular pacer leads are noted in place. Coronary artery calcifications are seen. Increased interstitial markings are seen in the posterior lower lung fields. There are multiple calcified nodules in both lower lung fields suggesting granulomas. Hepatobiliary: There is 12 mm low-density lesion in the left lobe of liver, possibly cyst. There is 14 mm smooth marginated low-density  lesion in the inferior right lobe. There is another 10 mm low-density lesion in the inferior right lobe, possibly suggesting cysts. There is no dilation of bile ducts. There is subtle increased density in the lumen of gallbladder suggesting presence of sludge or tiny stones. There is no pericholecystic stranding. Pancreas: No focal  abnormality is seen. Spleen: Spleen measures 12.1 cm in maximum diameter. Adrenals/Urinary Tract: Adrenals are unremarkable. There is no hydronephrosis. There are no renal or ureteral stones. There is 12 mm exophytic lesion in the lower pole of right kidney which has not changed significantly, possibly a cyst. Evaluation is limited in this noncontrast study. Urinary bladder is distended. There are multiple diverticula in the periphery of the urinary bladder largest measuring 6 cm in maximum diameter. Stomach/Bowel: There is fluid in the lumen of lower thoracic esophagus. Stomach is not distended. Small bowel loops are not dilated. Appendix is not seen. There is no pericecal inflammation. Scattered diverticula are seen in the colon without signs of focal acute diverticulitis. Vascular/Lymphatic: Scattered arterial calcifications are seen. No new significant lymphadenopathy seen. Reproductive: There is marked enlargement of prostate projecting into the base of the urinary bladder. Other: There is no ascites or pneumoperitoneum. Small umbilical hernia containing fat is seen. Small left inguinal hernia containing fat is seen. Musculoskeletal: Degenerative changes are noted in the lumbar spine with spinal stenosis and encroachment of neural foramina at multiple levels. IMPRESSION: There is no evidence of intestinal obstruction or pneumoperitoneum. There is no hydronephrosis. Marked cardiomegaly. Coronary artery disease. There is fluid in the lumen of lower thoracic esophagus suggesting gastroesophageal reflux. There is interval increase in interstitial markings in the posterior lower lung  fields suggesting interstitial pneumonitis or progression of scarring. Marked enlargement of prostate which may be due to benign prostatic hypertrophy or neoplasm. There are multiple diverticula in the margin of the urinary bladder suggesting chronic outlet obstruction. There is subtle increased density in the lumen of gallbladder suggesting presence of sludge or tiny stones. There are no signs of acute cholecystitis. Diverticulosis of colon. Lumbar spondylosis with spinal stenosis and encroachment of neural foramina at multiple levels. Possible cysts are seen in the liver and right kidney. Other findings as described in the body of the report. Electronically Signed   By: Elmer Picker M.D.   On: 11/23/2021 14:48   CT HEAD CODE STROKE WO CONTRAST`  Result Date: 11/01/2021 CLINICAL DATA:  Code stroke.  Facial droop and slurred speech EXAM: CT HEAD WITHOUT CONTRAST TECHNIQUE: Contiguous axial images were obtained from the base of the skull through the vertex without intravenous contrast. COMPARISON:  01/07/2015 FINDINGS: Brain: No evidence of acute infarction, hemorrhage, hydrocephalus, extra-axial collection or mass lesion/mass effect. Vascular: No hyperdense vessel or unexpected calcification. Skull: Normal. Negative for fracture or focal lesion. Sinuses/Orbits: No acute finding. Other: These results were communicated to Dr Stark Klein at 6:03 am on 11/01/2021 by text page via the [facility] messaging system. ASPECTS [facility] Stroke Program Early CT Score) - Ganglionic level infarction (caudate, lentiform nuclei, internal capsule, insula, M1-M3 cortex): 7 - Supraganglionic infarction (M4-M6 cortex): 3 Total score (0-10 with 10 being normal): 10 IMPRESSION: No acute finding.  No hemorrhage or visible infarct. Electronically Signed   By: Jorje Guild M.D.   On: 11/01/2021 06:05    Microbiology: Results for orders placed or performed during the hospital encounter of 11/23/21  Urine Culture     Status: Abnormal    Collection Time: 11/23/21  4:53 PM   Specimen: Urine, Random  Result Value Ref Range Status   Specimen Description   Final    URINE, RANDOM Performed at [facility], [address], [place], [place] [address]    Special Requests   Final    NONE Performed at [facility], [address] [place], [place] [address]  Culture (A)  Final    80,000 COLONIES/mL KLEBSIELLA OXYTOCA 40,000 COLONIES/mL PSEUDOMONAS AERUGINOSA    Report Status 11/26/2021 FINAL  Final   Organism ID, Bacteria KLEBSIELLA OXYTOCA (A)  Final   Organism ID, Bacteria PSEUDOMONAS AERUGINOSA (A)  Final      Susceptibility   Klebsiella oxytoca - MIC*    AMPICILLIN >=32 RESISTANT Resistant     CEFAZOLIN 8 SENSITIVE Sensitive     CEFEPIME <=0.12 SENSITIVE Sensitive     CEFTRIAXONE <=0.25 SENSITIVE Sensitive     CIPROFLOXACIN <=0.25 SENSITIVE Sensitive     GENTAMICIN <=1 SENSITIVE Sensitive     IMIPENEM <=0.25 SENSITIVE Sensitive     NITROFURANTOIN <=16 SENSITIVE Sensitive     TRIMETH/SULFA <=20 SENSITIVE Sensitive     AMPICILLIN/SULBACTAM 16 INTERMEDIATE Intermediate     PIP/TAZO <=4 SENSITIVE Sensitive     * 80,000 COLONIES/mL KLEBSIELLA OXYTOCA   Pseudomonas aeruginosa - MIC*    CEFTAZIDIME 4 SENSITIVE Sensitive     CIPROFLOXACIN <=0.25 SENSITIVE Sensitive     GENTAMICIN <=1 SENSITIVE Sensitive     IMIPENEM 1 SENSITIVE Sensitive     PIP/TAZO 8 SENSITIVE Sensitive     CEFEPIME 2 SENSITIVE Sensitive     * 40,000 COLONIES/mL PSEUDOMONAS AERUGINOSA  Resp Panel by RT-PCR (Flu A&B, Covid) Nasopharyngeal Swab     Status: None   Collection Time: 11/23/21  4:54 PM   Specimen: Nasopharyngeal Swab; Nasopharyngeal(NP) swabs in vial transport medium  Result Value Ref Range Status   SARS Coronavirus 2 by RT PCR NEGATIVE NEGATIVE Final    Comment: (NOTE) SARS-CoV-2 target nucleic acids are NOT DETECTED.  The SARS-CoV-2 RNA is generally detectable in upper respiratory specimens during the acute phase  of infection. The lowest concentration of SARS-CoV-2 viral copies this assay can detect is 138 copies/mL. A negative result does not preclude SARS-Cov-2 infection and should not be used as the sole basis for treatment or other patient management decisions. A negative result may occur with  improper specimen collection/handling, submission of specimen other than nasopharyngeal swab, presence of viral mutation(s) within the areas targeted by this assay, and inadequate number of viral copies(<138 copies/mL). A negative result must be combined with clinical observations, patient history, and epidemiological information. The expected result is Negative.  Fact Sheet for Patients:  EntrepreneurPulse.com.au  Fact Sheet for Healthcare Providers:  IncredibleEmployment.be  This test is no t yet approved or cleared by the [place] FDA and  has been authorized for detection and/or diagnosis of SARS-CoV-2 by FDA under an Emergency Use Authorization (EUA). This EUA will remain  in effect (meaning this test can be used) for the duration of the COVID-19 declaration under Section 564(b)(1) of the Act, 21 U.S.C.section 360bbb-3(b)(1), unless the authorization is terminated  or revoked sooner.       Influenza A by PCR NEGATIVE NEGATIVE Final   Influenza B by PCR NEGATIVE NEGATIVE Final    Comment: (NOTE) The Xpert Xpress SARS-CoV-2/FLU/RSV plus assay is intended as an aid in the diagnosis of influenza from Nasopharyngeal swab specimens and should not be used as a sole basis for treatment. Nasal washings and aspirates are unacceptable for Xpert Xpress SARS-CoV-2/FLU/RSV testing.  Fact Sheet for Patients: EntrepreneurPulse.com.au  Fact Sheet for Healthcare Providers: IncredibleEmployment.be  This test is not yet approved or cleared by the [place] FDA and has been authorized for detection and/or diagnosis of  SARS-CoV-2 by FDA under an Emergency Use Authorization (EUA). This EUA will remain in effect (meaning this test can  be used) for the duration of the COVID-19 declaration under Section 564(b)(1) of the Act, 21 U.S.C. section 360bbb-3(b)(1), unless the authorization is terminated or revoked.  Performed at [facility], [address] [place], [place] [address]     Labs: CBC: Recent Labs  Lab 11/23/21 1246 11/23/21 2042 11/24/21 0355 11/25/21 0439 11/26/21 0506 11/27/21 0446  WBC 5.2  --   --  6.7 5.7 5.4  HGB 12.8* 10.6* 12.3* 12.3* 12.0* 11.5*  HCT 41.7 33.6* 39.2 39.3 38.8* 37.1*  MCV 93.7  --   --  92.3 93.9 91.4  PLT 151  --   --  214 233 771   Basic Metabolic Panel: Recent Labs  Lab 11/23/21 1246 11/24/21 0355 11/25/21 0439 11/26/21 0506 11/27/21 0446  NA 133* 134* 133* 135 132*  K 2.7* 3.8 4.4 4.5 4.2  CL 83* 88* 88* 88* 88*  CO2 39* 36* 32 35* 32  GLUCOSE 110* 117* 79 88 94  BUN 46* 51* 63* 76* 78*  CREATININE 2.22* 2.35* 2.49* 3.08* 2.66*  CALCIUM 8.7* 8.5* 8.7* 8.9 9.0   Liver Function Tests: Recent Labs  Lab 11/23/21 1246  AST 33  ALT 24  ALKPHOS 68  BILITOT 3.9*  PROT 6.2*  ALBUMIN 3.5   CBG: No results for input(s): GLUCAP in the last 168 hours.  Discharge time spent: greater than 30 minutes.  Signed: Max Sane, MD [facility] 11/27/2021

## 2021-11-28 DIAGNOSIS — Z95810 Presence of automatic (implantable) cardiac defibrillator: Secondary | ICD-10-CM

## 2021-11-28 DIAGNOSIS — I10 Essential (primary) hypertension: Secondary | ICD-10-CM

## 2021-11-28 DIAGNOSIS — I5042 Chronic combined systolic (congestive) and diastolic (congestive) heart failure: Secondary | ICD-10-CM

## 2021-11-28 DIAGNOSIS — E876 Hypokalemia: Secondary | ICD-10-CM

## 2021-11-28 DIAGNOSIS — I428 Other cardiomyopathies: Secondary | ICD-10-CM

## 2021-11-28 DIAGNOSIS — R791 Abnormal coagulation profile: Secondary | ICD-10-CM

## 2021-11-28 DIAGNOSIS — J9611 Chronic respiratory failure with hypoxia: Secondary | ICD-10-CM

## 2021-11-28 DIAGNOSIS — J9612 Chronic respiratory failure with hypercapnia: Secondary | ICD-10-CM

## 2021-11-28 DIAGNOSIS — N401 Enlarged prostate with lower urinary tract symptoms: Secondary | ICD-10-CM

## 2021-11-28 NOTE — Plan of Care (Signed)

## 2021-11-28 NOTE — Progress Notes (Addendum)
[facility])   Please send signed and completed DNR with patient/family upon discharge. Please provide prescriptions at discharge as needed to ensure ongoing symptom management and a transport packet.   [facility] information and contact numbers given to family and above information shared with TOC.   Addendum: 9:44a--  Transport arranged for 1p by MSW.   Please call with any questions/concerns.    Thank you for the opportunity to participate in this patient's care   Daphene Calamity, MSW [facility] Liaison  [phone]

## 2021-11-28 NOTE — Progress Notes (Signed)
AVS given and explained to patient's son awaiting for EMS.

## 2021-11-28 NOTE — Discharge Summary (Signed)
Physician Discharge Summary   Patient: Adrian Chavez MRN: [id number] DOB: 01/18/1941  Admit date:     11/23/2021  Discharge date:   Discharge Physician: Kerney Elbe   PCP: Dion Body, MD   Recommendations at discharge:    Hospice at home  Discharge Diagnoses Principal Problem:   Gross hematuria Active Problems:   Benign prostatic hyperplasia with lower urinary tract symptoms   AF (paroxysmal atrial fibrillation) (HCC)   Chronic combined systolic and diastolic CHF (congestive heart failure) ([place])   Essential hypertension   ICD (implantable cardioverter-defibrillator) in place   NICM (nonischemic cardiomyopathy) ([place])   Warfarin anticoagulation   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 [facility] Course   81 y.o. 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   1/21: Urology recommends continuing Foley and irrigation. 1/22 - still blood in foley but no clots 1/23 - urine c/s growing pseudomonas - Abx changed to PO cipro. Foley with minimal blood 1/24 - patient was scheduled to go home with hospice but DME/Hospital bed couldn't be delivered today so likely will stay overnight  1/25 - Remains medically stable to D/C Home with Hospice and Bed has  been delivered. Will need outpatient Follow up with Urology for TOV and will need repeat INR within 1-2 days.   * Gross hematuria- (present on admission) Urology replaced Foley with 20 French coud catheter. Urine is now darker but not much blood.  Hyperbilirubinemia- (present on admission) Bilirubin 3.9 (2.9) likely due to hepatic congestion.  Monitor for now  ABLA (acute blood loss anemia)- (present on admission) Due to hematuria.  Hemoglobin stable for now  Complicated UTI (urinary tract infection)- (present on admission) Urine c/s growing pseudomonas and Klebsiella - change Abx to Cipro  Hypokalemia- (present on admission) Repleted and resolved  Chronic respiratory failure with hypoxia and hypercapnia ([place])- (present on admission) At baseline uses 2 L oxygen. Same now  Supratherapeutic INR- (present on admission) INR 6->5.3.  Given total 10 mg vitamin K thus far. 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  Foley changed to 20 [place] coud catheter by urologist and draining bloody urine but improving .Nephrology following. Renal function worsening some - could be due to hematuria -BUN/Cr now went from 76/3.08 -> 78/2.66 -Avoid Nephrotoxic Medications, Contrast Dyes, Hypotension and Renally adjust medications -Repeat CMP within 1 week   Warfarin anticoagulation Holding warfarin due to hematuria.  INR  6-> 5.3 -> 2.6.  Recheck INR within 1-2 days  NICM (nonischemic cardiomyopathy) (HCC) Chronic combined systolic and diastolic CHF.  ICD in place.  EF less than 20%.  ICD (implantable cardioverter-defibrillator) in place- (present on admission) Per his  wife who is at bedside his ICD has not fired  Essential hypertension- (present on admission) Hypotensive -.  Holding all antihypertensive and using midodrine for  now  Chronic combined systolic and diastolic CHF (congestive heart failure) ([place])- (present on admission) Unable to use any diuretic considering hypotension. continue midodrine  AF (paroxysmal atrial fibrillation) ([place])- (present on admission) Holding amiodarone and Coreg due to hypotension.  Holding warfarin due to hematuria. INR still 5.3. will give Vit K 10 mg  Benign prostatic hyperplasia with lower urinary tract symptoms- (present on admission) Continue Foley, finesteride   Consultants: Urology Disposition: Home with Hospice Diet recommendation: Regular diet  DISCHARGE MEDICATION: Allergies as of 11/28/2021       Reactions   Escitalopram Oxalate Nausea Only   Oxycodone    vomiting        Medication List     STOP taking these medications    amiodarone 200 MG tablet Commonly known as: PACERONE   carvedilol 3.125 MG tablet Commonly known as: COREG   Cialis 5 MG tablet Generic drug: tadalafil   metolazone 5 MG tablet Commonly known as: ZAROXOLYN   potassium chloride SA 20 MEQ tablet Commonly known as: KLOR-CON M   torsemide 20 MG tablet Commonly known as: DEMADEX   warfarin 1 MG tablet Commonly known as: COUMADIN       TAKE these medications    albuterol 108 (90 Base) MCG/ACT inhaler Commonly known as: VENTOLIN HFA SMARTSIG:1-2 Inhalation Via Inhaler Every 4 Hours PRN   ALPRAZolam 0.5 MG tablet Commonly known as: XANAX Take 0.5 mg by mouth daily as needed for anxiety.   ciprofloxacin 500 MG tablet Commonly known as: CIPRO Take 1 tablet (500 mg total) by mouth daily with breakfast for 5 days.   finasteride 5 MG tablet Commonly known as: Proscar Take 1 tablet (5 mg total) by mouth daily.   fluticasone 50 MCG/ACT nasal spray Commonly known as: FLONASE Place 2 sprays into both nostrils daily as needed for allergies or rhinitis.   guaiFENesin 600 MG 12 hr tablet Commonly known as: MUCINEX Take 600 mg by mouth daily.   midodrine 5 MG  tablet Commonly known as: PROAMATINE Take 1 tablet (5 mg total) by mouth 3 (three) times daily with meals.   sertraline 25 MG tablet Commonly known as: ZOLOFT Take 25 mg by mouth daily.   vitamin B-12 1000 MCG tablet Commonly known as: CYANOCOBALAMIN Take 1,000 mcg by mouth daily.        Follow-up Information     Dion Body, MD. Go on 12/03/2021.   Specialty: Family Medicine Why: [facility] Discharge F/UP;  Appt @ 2:00 pm Contact information: [place] [address] [phone]         Zara Council A, PA-C. Go on 12/05/2021.   Specialty: Urology Why: [facility] Discharge.  Appt  @ 11:30 am Contact information: [place] [id number] [phone]                 Discharge Exam: Danley Danker Weights   11/24/21 1729  Weight: 83.1 kg    Constitutional: chronically ill appearing, awake and alert. Not in any apparent distress HEENT:      Head: Normocephalic and atraumatic.         Eyes: PERLA, EOMI, Conjunctivae are normal. Sclera is non-icteric.       Mouth/Throat: Mucous membranes are moist.       Neck: Supple with no signs  of meningismus. Cardiovascular: Regular rate and rhythm. No murmurs, gallops, or rubs. 2+ symmetrical distal pulses are present . No JVD. No  LE edema Respiratory: Respiratory effort normal .Lungs sounds clear bilaterally. No wheezes, crackles, or rhonchi.  Gastrointestinal: Soft, non tender, non distended. Positive bowel sounds.  Genitourinary: Foley catheter in place draining dark urine Musculoskeletal: Nontender with normal range of motion in all extremities. No cyanosis, or erythema of extremities. Neurologic:  Face is symmetric. Moving all extremities. No gross focal neurologic deficits . Skin: Skin is warm, dry.  No rash or ulcers Psychiatric: normal mood and affect  Condition at discharge: poor  The results of significant diagnostics from this  hospitalization (including imaging, microbiology, ancillary and laboratory) are listed below for reference.   Imaging Studies: DG Chest 1 View  Result Date: 11/02/2021 CLINICAL DATA:  81 year old male with history of DVT. EXAM: CHEST  1 VIEW COMPARISON:  Chest x-ray 10/31/2021. FINDINGS: Lung volumes are normal. There is cephalization of the pulmonary vasculature and slight indistinctness of the interstitial markings suggestive of mild pulmonary edema. Trace left pleural effusion. No definite right pleural effusion. No pneumothorax. Moderate to severe cardiomegaly. The patient is rotated to the right on today's exam, resulting in distortion of the mediastinal contours and reduced diagnostic sensitivity and specificity for mediastinal pathology. Atherosclerotic calcifications in the thoracic aorta. Left-sided biventricular pacemaker/AICD with lead tips projecting over the expected location of the right atrium, right ventricle and lateral wall the left ventricle via the coronary sinus and coronary veins. IMPRESSION: 1. The appearance the chest suggest congestive heart failure, as above. Electronically Signed   By: Vinnie Langton M.D.   On: 11/02/2021 07:22   DG Abd 1 View  Result Date: 10/31/2021 CLINICAL DATA:  Abdominal pain. EXAM: ABDOMEN - 1 VIEW COMPARISON:  October 27, 2010 FINDINGS: Mild atelectatic changes are seen within the bilateral lung bases. The cardiac silhouette is markedly enlarged. The bowel gas pattern is normal. Multiple small radiopaque surgical coils are seen overlying the lower pelvis on the right. No radio-opaque calculi or other significant radiographic abnormality are seen. IMPRESSION: 1. Normal bowel gas pattern without evidence of renal calculi. 2. Mild bibasilar atelectasis. Electronically Signed   By: Virgina Norfolk M.D.   On: 10/31/2021 23:39   US Venous Img Upper Uni Left (DVT)  Result Date: 11/01/2021 CLINICAL DATA:  Bruising of left forearm status post fall 1  week ago Edema Pain EXAM: LEFT UPPER EXTREMITY VENOUS DOPPLER ULTRASOUND TECHNIQUE: Gray-scale sonography with graded compression, as well as color Doppler and duplex ultrasound were performed to evaluate the upper extremity deep venous system from the level of the subclavian vein and including the jugular, axillary, basilic, radial, ulnar and upper cephalic vein. Spectral Doppler was utilized to evaluate flow at rest and with distal augmentation maneuvers. COMPARISON:  None. FINDINGS: Contralateral Subclavian Vein: Respiratory phasicity is normal and symmetric with the symptomatic side. No evidence of thrombus. Normal compressibility. Internal Jugular Vein: No evidence of thrombus. Normal compressibility, respiratory phasicity and response to augmentation. Subclavian Vein: No evidence of thrombus. Normal compressibility, respiratory phasicity and response to augmentation. Axillary Vein: No evidence of thrombus. Normal compressibility, respiratory phasicity and response to augmentation. Cephalic Vein: No evidence of thrombus. Normal compressibility, respiratory phasicity and response to augmentation. 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 Brachial Veins: No evidence of thrombus. Normal compressibility, respiratory phasicity and response to augmentation. Radial Veins: No evidence of thrombus. Normal compressibility, respiratory phasicity and  response to augmentation. Ulnar Veins: No evidence of thrombus. Normal compressibility, respiratory phasicity and response to augmentation. Venous Reflux:  None visualized. Other Findings:  None visualized. IMPRESSION: 1. No left lower extremity DVT. 2. Minimal partially occlusive web-like thrombus in the proximal left basilic vein is consistent with chronic superficial venous thrombosis. Electronically Signed   By: Miachel Roux M.D.   On: 11/01/2021 17:29   DG Chest Port 1 View  Result Date: 10/31/2021 CLINICAL  DATA:  Questionable sepsis.  Evaluate for abnormality. EXAM: PORTABLE CHEST 1 VIEW COMPARISON:  03/05/2021 FINDINGS: Multi lead ICD is identified with battery pack in the left chest wall. Marked cardiac enlargement is unchanged. There is blunting of the left costophrenic angle, new from previous exam. This may represent a small effusion. Diffuse pulmonary vascular congestion. No frank edema or airspace consolidation. Platelike atelectasis is noted in the right lung base. IMPRESSION: 1. Cardiac enlargement, small left pleural effusion and pulmonary vascular congestion. Correlate for any signs or symptoms of CHF. 2. Right base atelectasis. Electronically Signed   By: Kerby Moors M.D.   On: 10/31/2021 20:26   DG Shoulder Left Port  Result Date: 11/01/2021 CLINICAL DATA:  Left shoulder pain EXAM: LEFT SHOULDER COMPARISON:  None. FINDINGS: Mild AC joint degenerative change. Moderate glenohumeral degenerative change. No fracture or dislocation. Faint calcifications at the superolateral humeral head. IMPRESSION: 1. No acute osseous abnormality 2. Moderate degenerative changes with calcific tendinopathy. Electronically Signed   By: Donavan Foil M.D.   On: 11/01/2021 16:10   ECHOCARDIOGRAM COMPLETE  Result Date: 11/01/2021    ECHOCARDIOGRAM REPORT   Patient Name:   Adrian Chavez Date of Exam: 11/01/2021 Medical Rec #:  [id number]    Height:       68.0 in Accession #:    [id number]   Weight:       184.7 lb Date of Birth:  1940-12-28    BSA:          1.976 m Patient Age:    81 years     BP:           101/73 mmHg Patient Gender: M            HR:           78 bpm. Exam Location:  [facility] Procedure: 2D Echo, Cardiac Doppler and Color Doppler Indications:     CHF-acute diastolic Q25.95                  CHF-acute systolic G38.75  History:         Patient has no prior history of Echocardiogram examinations.                  CHF; Risk Factors:Hypertension.  Sonographer:     Sherrie Sport Referring Phys:  [id number] [facility] OUMA Diagnosing Phys: Donnelly Angelica  Sonographer Comments: Suboptimal apical window. IMPRESSIONS  1. Left ventricular ejection fraction, by estimation, is <20%. The left ventricle has severely decreased function. The left ventricle demonstrates regional wall motion abnormalities (see scoring diagram/findings for description). The left ventricular internal cavity size was mildly dilated. Left ventricular diastolic parameters are indeterminate. There is severe hypokinesis of the left ventricular, entire inferior wall and inferolateral wall.  2. Right ventricular systolic function reduced. The right ventricular size is not well visualized.  3. Left atrial size was severely dilated.  4. Right atrial size was severely dilated.  5. The mitral valve is grossly normal. Moderate mitral valve regurgitation.  6.  The aortic valve is normal in structure. Aortic valve regurgitation is not visualized. Aortic valve sclerosis is present, with no evidence of aortic valve stenosis.  7. Mild pulmonic stenosis. FINDINGS  Left Ventricle: Left ventricular ejection fraction, by estimation, is <20%. The left ventricle has severely decreased function. The left ventricle demonstrates regional wall motion abnormalities. Severe hypokinesis of the left ventricular, entire inferior wall and inferolateral wall. The left ventricular internal cavity size was mildly dilated. There is no left ventricular hypertrophy. Left ventricular diastolic parameters are indeterminate. Right Ventricle: The right ventricular size is not well visualized. Right vetricular wall thickness was not well visualized. Right ventricular systolic function reduced. Left Atrium: Left atrial size was severely dilated. Right Atrium: Right atrial size was severely dilated. Pericardium: There is no evidence of pericardial effusion. Mitral Valve: The mitral valve is grossly normal. Moderate mitral valve regurgitation. Tricuspid Valve: The tricuspid valve is normal in structure.  Tricuspid valve regurgitation is not demonstrated. Aortic Valve: The aortic valve is normal in structure. Aortic valve regurgitation is not visualized. Aortic valve sclerosis is present, with no evidence of aortic valve stenosis. Aortic valve mean gradient measures 2.0 mmHg. Aortic valve peak gradient measures 3.4 mmHg. Aortic valve area, by VTI measures 2.47 cm. Pulmonic Valve: The pulmonic valve was not well visualized. Pulmonic valve regurgitation is not visualized. Mild pulmonic stenosis. Aorta: The aortic root is normal in size and structure. Venous: The inferior vena cava was not well visualized. IAS/Shunts: The interatrial septum was not well visualized.  LEFT VENTRICLE PLAX 2D LVIDd:         6.20 cm LVIDs:         5.70 cm LV PW:         1.40 cm LV IVS:        0.95 cm LVOT diam:     2.10 cm LV SV:         32 LV SV Index:   16 LVOT Area:     3.46 cm  LV Volumes (MOD) LV vol d, MOD A4C: 328.0 ml LV vol s, MOD A4C: 219.0 ml LV SV MOD A4C:     328.0 ml RIGHT VENTRICLE RV Basal diam:  4.70 cm RV S prime:     9.68 cm/s TAPSE (M-mode): 4.2 cm LEFT ATRIUM              Index         RIGHT ATRIUM           Index LA diam:        8.30 cm  4.20 cm/m    RA Area:     50.50 cm LA Vol (A2C):   554.0 ml 280.37 ml/m  RA Volume:   230.00 ml 116.40 ml/m LA Vol (A4C):   339.0 ml 171.56 ml/m LA Biplane Vol: 439.0 ml 222.17 ml/m  AORTIC VALVE                    PULMONIC VALVE AV Area (Vmax):    2.39 cm     PV Vmax:        0.60 m/s AV Area (Vmean):   2.21 cm     PV Vmean:       37.450 cm/s AV Area (VTI):     2.47 cm     PV VTI:         0.081 m AV Vmax:           91.80 cm/s   PV Peak grad:  1.4 mmHg AV Vmean:          62.100 cm/s  PV Mean grad:   1.0 mmHg AV VTI:            0.130 m      RVOT Peak grad: 3 mmHg AV Peak Grad:      3.4 mmHg AV Mean Grad:      2.0 mmHg LVOT Vmax:         63.30 cm/s LVOT Vmean:        39.600 cm/s LVOT VTI:          0.093 m LVOT/AV VTI ratio: 0.71  AORTA Ao Root diam: 3.10 cm MITRAL VALVE                 TRICUSPID VALVE MV Area (PHT): 4.93 cm     TR Peak grad:   16.8 mmHg MV Decel Time: 154 msec     TR Vmax:        205.00 cm/s MV E velocity: 111.00 cm/s                             SHUNTS                             Systemic VTI:  0.09 m                             Systemic Diam: 2.10 cm                             Pulmonic VTI:  0.130 m Donnelly Angelica Electronically signed by Donnelly Angelica Signature Date/Time: 11/01/2021/12:52:46 PM    Final    [place] LT UPPER EXTREM LTD SOFT TISSUE NON VASCULAR  Result Date: 11/01/2021 CLINICAL DATA:  Left forearm bruise status post fall 1 week ago EXAM: ULTRASOUND LEFT UPPER EXTREMITY LIMITED TECHNIQUE: Ultrasound examination of the upper extremity soft tissues was performed in the area of clinical concern. COMPARISON:  None. FINDINGS: Targeted sonographic evaluation of the left forearm demonstrates a 1.5 x 0.6 x 1.0 cm complex fluid collection which is likely a hematoma. IMPRESSION: 1.5 x 0.6 x 1.0 cm complex fluid collection in the left forearm is likely a small hematoma. If patient's symptoms worsen, repeat ultrasound evaluation should be performed. Electronically Signed   By: Miachel Roux M.D.   On: 11/01/2021 17:30   CT Renal Stone Study  Result Date: 11/23/2021 CLINICAL DATA:  Flank pain EXAM: CT ABDOMEN AND PELVIS WITHOUT CONTRAST TECHNIQUE: Multidetector CT imaging of the abdomen and pelvis was performed following the standard protocol without IV contrast. RADIATION DOSE REDUCTION: This exam was performed according to the departmental dose-optimization program which includes automated exposure control, adjustment of the mA and/or kV according to patient size and/or use of iterative reconstruction technique. COMPARISON:  03/05/2021 FINDINGS: Lower chest: Heart is markedly enlarged in size. Biventricular pacer leads are noted in place. Coronary artery calcifications are seen. Increased interstitial markings are seen in the posterior lower lung fields. There are  multiple calcified nodules in both lower lung fields suggesting granulomas. Hepatobiliary: There is 12 mm low-density lesion in the left lobe of liver, possibly cyst. There is 14 mm smooth marginated low-density lesion in the inferior right lobe. There is another 10 mm low-density lesion in the inferior right  lobe, possibly suggesting cysts. There is no dilation of bile ducts. There is subtle increased density in the lumen of gallbladder suggesting presence of sludge or tiny stones. There is no pericholecystic stranding. Pancreas: No focal abnormality is seen. Spleen: Spleen measures 12.1 cm in maximum diameter. Adrenals/Urinary Tract: Adrenals are unremarkable. There is no hydronephrosis. There are no renal or ureteral stones. There is 12 mm exophytic lesion in the lower pole of right kidney which has not changed significantly, possibly a cyst. Evaluation is limited in this noncontrast study. Urinary bladder is distended. There are multiple diverticula in the periphery of the urinary bladder largest measuring 6 cm in maximum diameter. Stomach/Bowel: There is fluid in the lumen of lower thoracic esophagus. Stomach is not distended. Small bowel loops are not dilated. Appendix is not seen. There is no pericecal inflammation. Scattered diverticula are seen in the colon without signs of focal acute diverticulitis. Vascular/Lymphatic: Scattered arterial calcifications are seen. No new significant lymphadenopathy seen. Reproductive: There is marked enlargement of prostate projecting into the base of the urinary bladder. Other: There is no ascites or pneumoperitoneum. Small umbilical hernia containing fat is seen. Small left inguinal hernia containing fat is seen. Musculoskeletal: Degenerative changes are noted in the lumbar spine with spinal stenosis and encroachment of neural foramina at multiple levels. IMPRESSION: There is no evidence of intestinal obstruction or pneumoperitoneum. There is no hydronephrosis. Marked  cardiomegaly. Coronary artery disease. There is fluid in the lumen of lower thoracic esophagus suggesting gastroesophageal reflux. There is interval increase in interstitial markings in the posterior lower lung fields suggesting interstitial pneumonitis or progression of scarring. Marked enlargement of prostate which may be due to benign prostatic hypertrophy or neoplasm. There are multiple diverticula in the margin of the urinary bladder suggesting chronic outlet obstruction. There is subtle increased density in the lumen of gallbladder suggesting presence of sludge or tiny stones. There are no signs of acute cholecystitis. Diverticulosis of colon. Lumbar spondylosis with spinal stenosis and encroachment of neural foramina at multiple levels. Possible cysts are seen in the liver and right kidney. Other findings as described in the body of the report. Electronically Signed   By: Elmer Picker M.D.   On: 11/23/2021 14:48   CT HEAD CODE STROKE WO CONTRAST`  Result Date: 11/01/2021 CLINICAL DATA:  Code stroke.  Facial droop and slurred speech EXAM: CT HEAD WITHOUT CONTRAST TECHNIQUE: Contiguous axial images were obtained from the base of the skull through the vertex without intravenous contrast. COMPARISON:  01/07/2015 FINDINGS: Brain: No evidence of acute infarction, hemorrhage, hydrocephalus, extra-axial collection or mass lesion/mass effect. Vascular: No hyperdense vessel or unexpected calcification. Skull: Normal. Negative for fracture or focal lesion. Sinuses/Orbits: No acute finding. Other: These results were communicated to Dr Stark Klein at 6:03 am on 11/01/2021 by text page via the [facility] messaging system. ASPECTS [facility] Stroke Program Early CT Score) - Ganglionic level infarction (caudate, lentiform nuclei, internal capsule, insula, M1-M3 cortex): 7 - Supraganglionic infarction (M4-M6 cortex): 3 Total score (0-10 with 10 being normal): 10 IMPRESSION: No acute finding.  No hemorrhage or visible infarct.  Electronically Signed   By: Jorje Guild M.D.   On: 11/01/2021 06:05    Microbiology: Results for orders placed or performed during the hospital encounter of 11/23/21  Urine Culture     Status: Abnormal   Collection Time: 11/23/21  4:53 PM   Specimen: Urine, Random  Result Value Ref Range Status   Specimen Description   Final    URINE, RANDOM Performed  at [facility], [address] [place], [place] [address]    Special Requests   Final    NONE Performed at [facility], [address] [place],  [address]    Culture (A)  Final    80,000 COLONIES/mL KLEBSIELLA OXYTOCA 40,000 COLONIES/mL PSEUDOMONAS AERUGINOSA    Report Status 11/26/2021 FINAL  Final   Organism ID, Bacteria KLEBSIELLA OXYTOCA (A)  Final   Organism ID, Bacteria PSEUDOMONAS AERUGINOSA (A)  Final      Susceptibility   Klebsiella oxytoca - MIC*    AMPICILLIN >=32 RESISTANT Resistant     CEFAZOLIN 8 SENSITIVE Sensitive     CEFEPIME <=0.12 SENSITIVE Sensitive     CEFTRIAXONE <=0.25 SENSITIVE Sensitive     CIPROFLOXACIN <=0.25 SENSITIVE Sensitive     GENTAMICIN <=1 SENSITIVE Sensitive     IMIPENEM <=0.25 SENSITIVE Sensitive     NITROFURANTOIN <=16 SENSITIVE Sensitive     TRIMETH/SULFA <=20 SENSITIVE Sensitive     AMPICILLIN/SULBACTAM 16 INTERMEDIATE Intermediate     PIP/TAZO <=4 SENSITIVE Sensitive     * 80,000 COLONIES/mL KLEBSIELLA OXYTOCA   Pseudomonas aeruginosa - MIC*    CEFTAZIDIME 4 SENSITIVE Sensitive     CIPROFLOXACIN <=0.25 SENSITIVE Sensitive     GENTAMICIN <=1 SENSITIVE Sensitive     IMIPENEM 1 SENSITIVE Sensitive     PIP/TAZO 8 SENSITIVE Sensitive     CEFEPIME 2 SENSITIVE Sensitive     * 40,000 COLONIES/mL PSEUDOMONAS AERUGINOSA  Resp Panel by RT-PCR (Flu A&B, Covid) Nasopharyngeal Swab     Status: None   Collection Time: 11/23/21  4:54 PM   Specimen: Nasopharyngeal Swab; Nasopharyngeal(NP) swabs in vial transport medium  Result Value Ref Range Status   SARS  Coronavirus 2 by RT PCR NEGATIVE NEGATIVE Final    Comment: (NOTE) SARS-CoV-2 target nucleic acids are NOT DETECTED.  The SARS-CoV-2 RNA is generally detectable in upper respiratory specimens during the acute phase of infection. The lowest concentration of SARS-CoV-2 viral copies this assay can detect is 138 copies/mL. A negative result does not preclude SARS-Cov-2 infection and should not be used as the sole basis for treatment or other patient management decisions. A negative result may occur with  improper specimen collection/handling, submission of specimen other than nasopharyngeal swab, presence of viral mutation(s) within the areas targeted by this assay, and inadequate number of viral copies(<138 copies/mL). A negative result must be combined with clinical observations, patient history, and epidemiological information. The expected result is Negative.  Fact Sheet for Patients:  EntrepreneurPulse.com.au  Fact Sheet for Healthcare Providers:  IncredibleEmployment.be  This test is no t yet approved or cleared by the [place] FDA and  has been authorized for detection and/or diagnosis of SARS-CoV-2 by FDA under an Emergency Use Authorization (EUA). This EUA will remain  in effect (meaning this test can be used) for the duration of the COVID-19 declaration under Section 564(b)(1) of the Act, 21 U.S.C.section 360bbb-3(b)(1), unless the authorization is terminated  or revoked sooner.       Influenza A by PCR NEGATIVE NEGATIVE Final   Influenza B by PCR NEGATIVE NEGATIVE Final    Comment: (NOTE) The Xpert Xpress SARS-CoV-2/FLU/RSV plus assay is intended as an aid in the diagnosis of influenza from Nasopharyngeal swab specimens and should not be used as a sole basis for treatment. Nasal washings and aspirates are unacceptable for Xpert Xpress SARS-CoV-2/FLU/RSV testing.  Fact Sheet for  Patients: EntrepreneurPulse.com.au  Fact Sheet for Healthcare Providers: IncredibleEmployment.be  This test is  not yet approved or cleared by the [place] and has been authorized for detection and/or diagnosis of SARS-CoV-2 by FDA under an Emergency Use Authorization (EUA). This EUA will remain in effect (meaning this test can be used) for the duration of the COVID-19 declaration under Section 564(b)(1) of the Act, 21 U.S.C. section 360bbb-3(b)(1), unless the authorization is terminated or revoked.  Performed at [facility], [address] [place], [place] [address]     Labs: CBC: Recent Labs  Lab 11/23/21 1246 11/23/21 2042 11/24/21 0355 11/25/21 0439 11/26/21 0506 11/27/21 0446  WBC 5.2  --   --  6.7 5.7 5.4  HGB 12.8* 10.6* 12.3* 12.3* 12.0* 11.5*  HCT 41.7 33.6* 39.2 39.3 38.8* 37.1*  MCV 93.7  --   --  92.3 93.9 91.4  PLT 151  --   --  214 233 683    Basic Metabolic Panel: Recent Labs  Lab 11/23/21 1246 11/24/21 0355 11/25/21 0439 11/26/21 0506 11/27/21 0446  NA 133* 134* 133* 135 132*  K 2.7* 3.8 4.4 4.5 4.2  CL 83* 88* 88* 88* 88*  CO2 39* 36* 32 35* 32  GLUCOSE 110* 117* 79 88 94  BUN 46* 51* 63* 76* 78*  CREATININE 2.22* 2.35* 2.49* 3.08* 2.66*  CALCIUM 8.7* 8.5* 8.7* 8.9 9.0    Liver Function Tests: Recent Labs  Lab 11/23/21 1246  AST 33  ALT 24  ALKPHOS 68  BILITOT 3.9*  PROT 6.2*  ALBUMIN 3.5    CBG: No results for input(s): GLUCAP in the last 168 hours.  Discharge time spent: greater than 30 minutes.  Signed: Raiford Noble, DO [facility] 11/28/2021

## 2021-11-28 NOTE — TOC Progression Note (Signed)
Transition of Care [facility]) - Progression Note    Patient Details  Name: Adrian Chavez MRN: [id number] Date of Birth: 01-19-1941  Transition of Care [facility]) CM/SW [place], RN Phone Number: 11/28/2021, 8:59 AM  Clinical Narrative:   Hospice has arranged DME to be delivered to the home, EMS is being set up by Hospice to come qat 1 PM, [place] packet is on the chart         Expected Discharge Plan and Services           Expected Discharge Date: 11/27/21                                     Social Determinants of Health ([place]) Interventions    Readmission Risk Interventions Readmission Risk Prevention Plan 11/08/2021  Transportation Screening Complete  PCP or Specialist Appt within 5-7 Days Complete  Home Care Screening Complete  Medication Review (RN CM) Complete  Some recent data might be hidden

## 2021-12-04 ENCOUNTER — Ambulatory Visit: Payer: HMO | Admitting: Family

## 2021-12-05 ENCOUNTER — Ambulatory Visit: Payer: HMO | Admitting: Urology

## 2021-12-05 DEATH — deceased

## 2021-12-25 ENCOUNTER — Ambulatory Visit: Payer: HMO | Admitting: Urology

## 2022-02-20 ENCOUNTER — Ambulatory Visit: Payer: HMO | Admitting: Urology

## 2022-09-26 IMAGING — CT CT HEAD CODE STROKE
5 of 8 series · 17 of 47 positions shown, 18 images · non-contrast
Comparison: 01/07/2015

CLINICAL DATA: Code stroke.  Facial droop and slurred speech

EXAM:
CT HEAD WITHOUT CONTRAST
TECHNIQUE: Contiguous axial images were obtained from the base of the skull
through the vertex without intravenous contrast.

[Series 3: head wo · axial · 0.49mm/px · z∈[-105,-50]mm · 2 of 35 slices shown, 3 images (1 of 2)]
[im 12/35  brain]
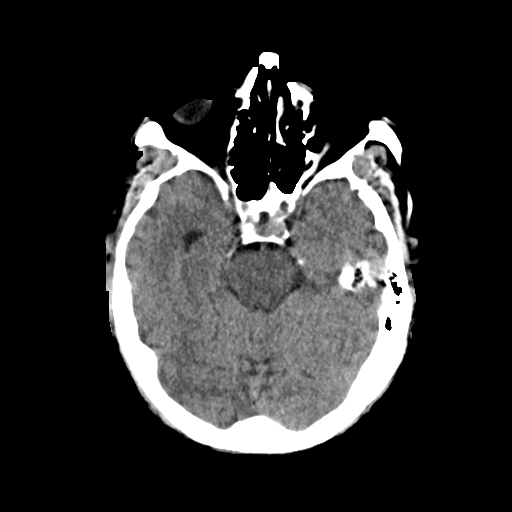
[im 12/35  bone]
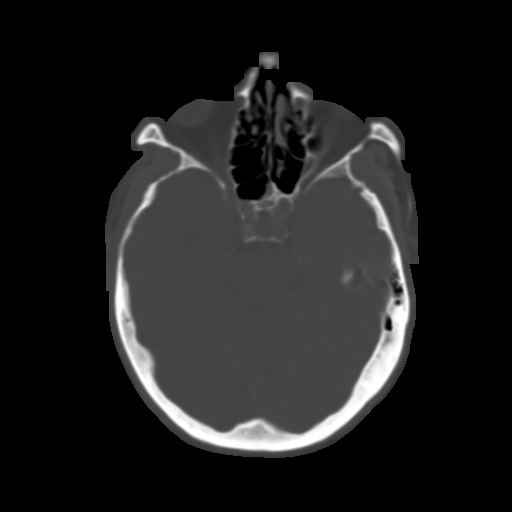
[im 23/35  brain]
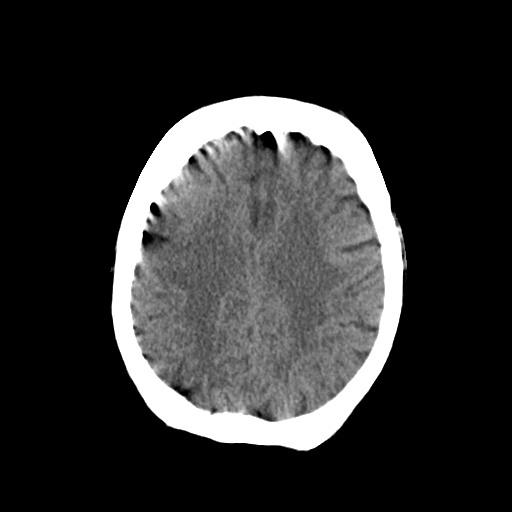

[Series 4: head bone · axial · 0.49mm/px · z∈[-144,-6]mm · 8 of 87 slices shown]
[im 9/87  bone]
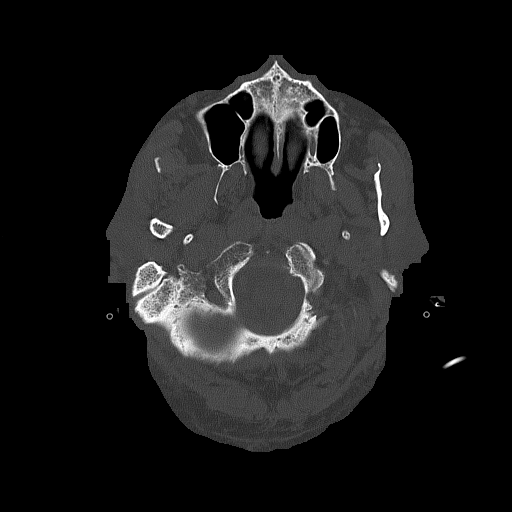
[im 18/87  bone]
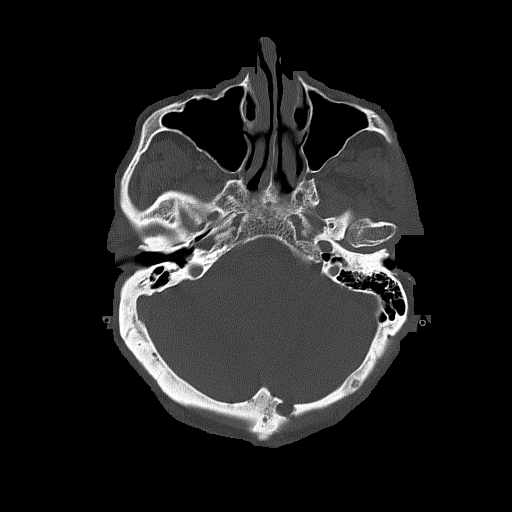
[im 26/87  bone]
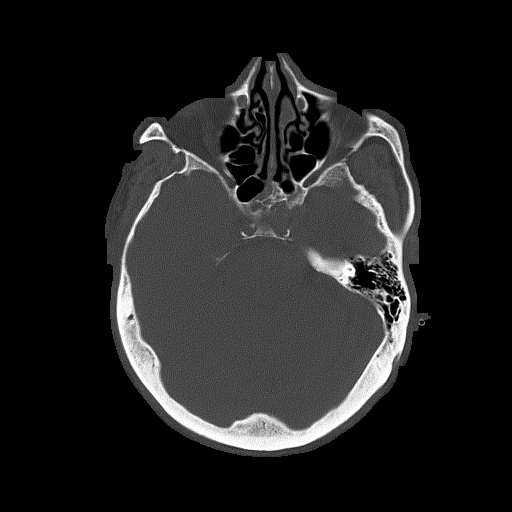
[im 35/87  bone]
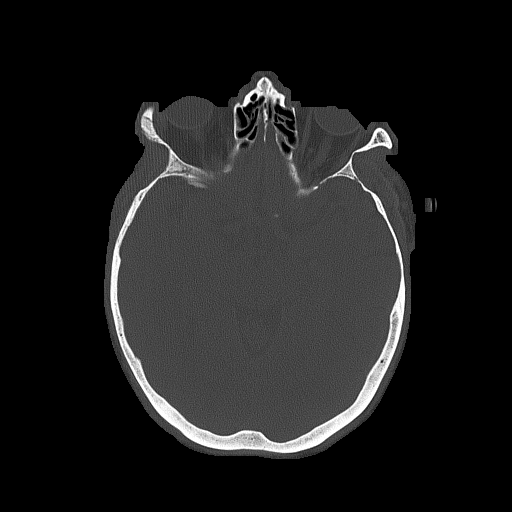
[im 52/87  bone]
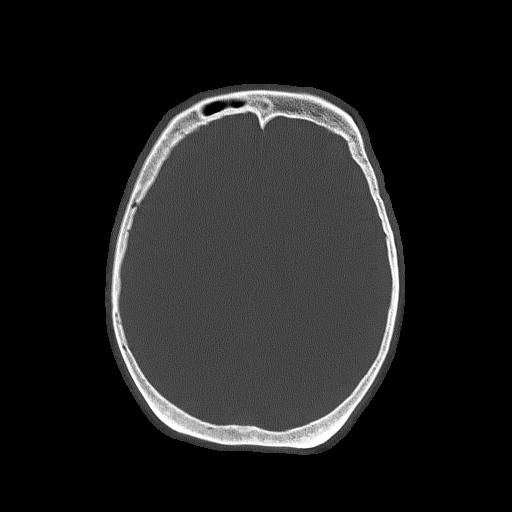
[im 61/87  bone]
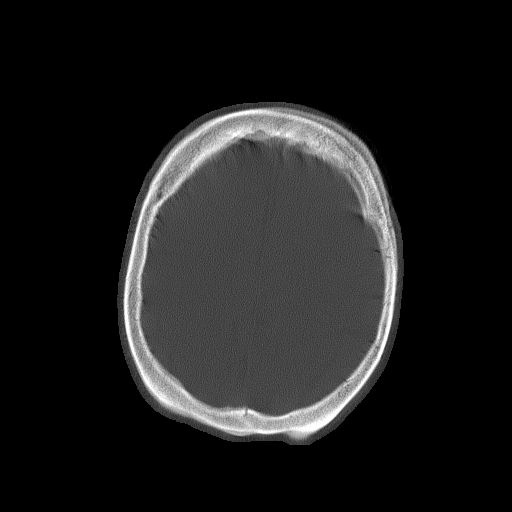
[im 69/87  bone]
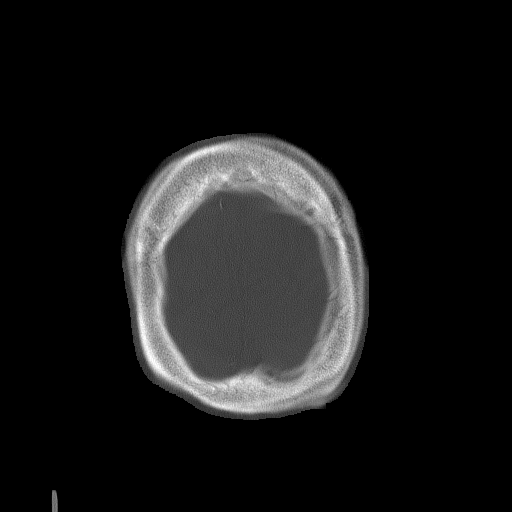
[im 78/87  bone]
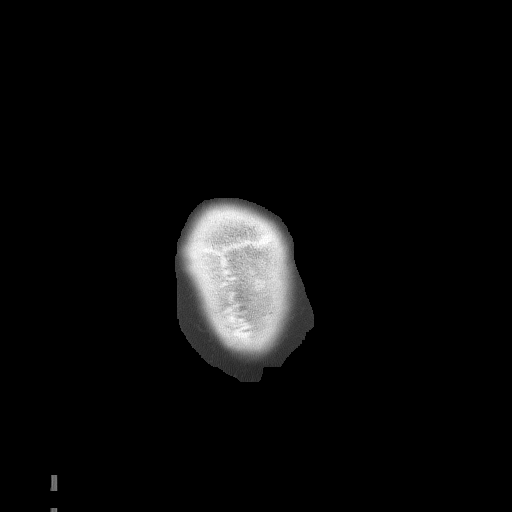

[Series 5: coronal soft tissue · coronal · 0.37mm/px · 3 of 68 slices shown]
[im 24/68  brain]
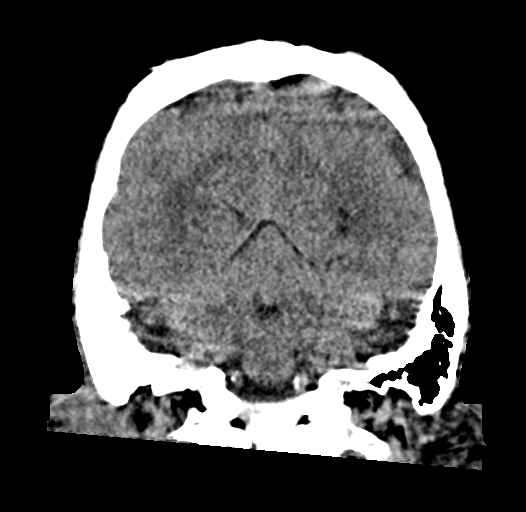
[im 36/68  brain]
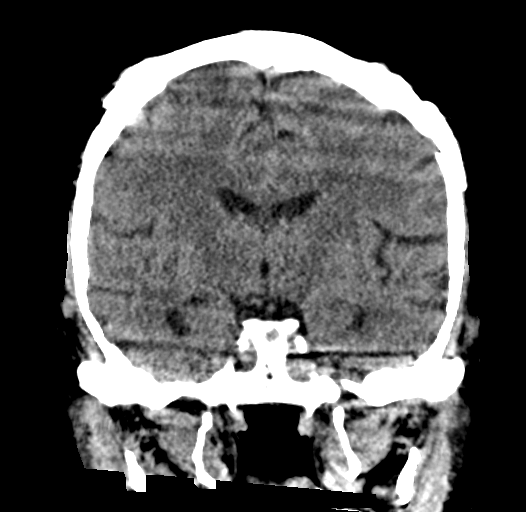
[im 48/68  brain]
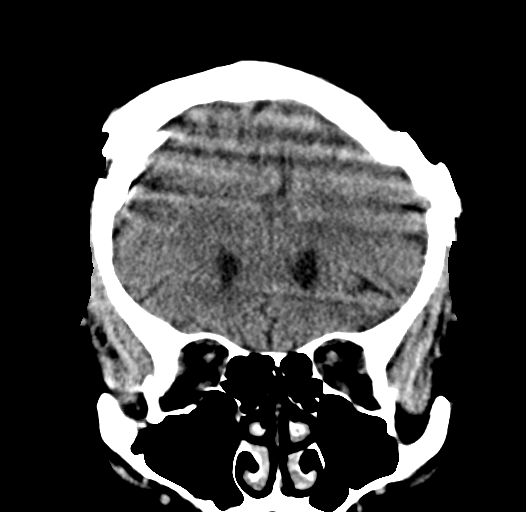

[Series 7: head wo · axial · 0.49mm/px · z∈[-105,-50]mm · 2 of 35 slices shown (2 of 2)]
[im 12/35  brain]
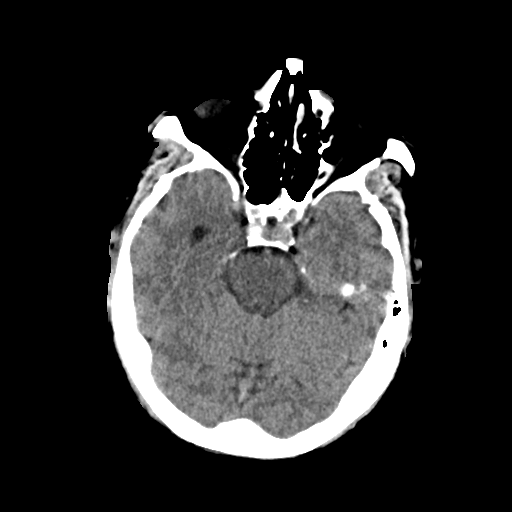
[im 23/35  brain]
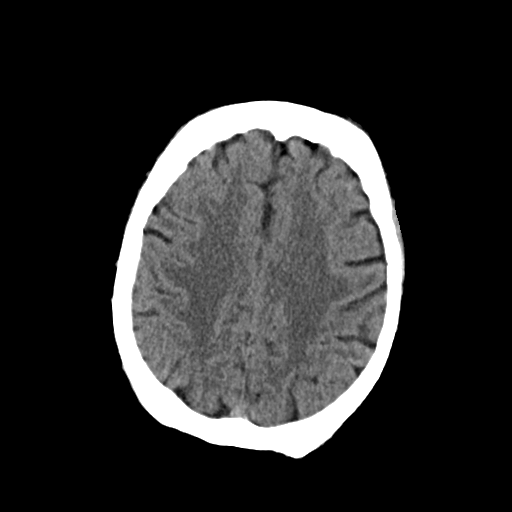

[Series 10: sagittal soft tissue · sagittal · 0.39mm/px · 2 of 58 slices shown]
[im 20/58  brain]
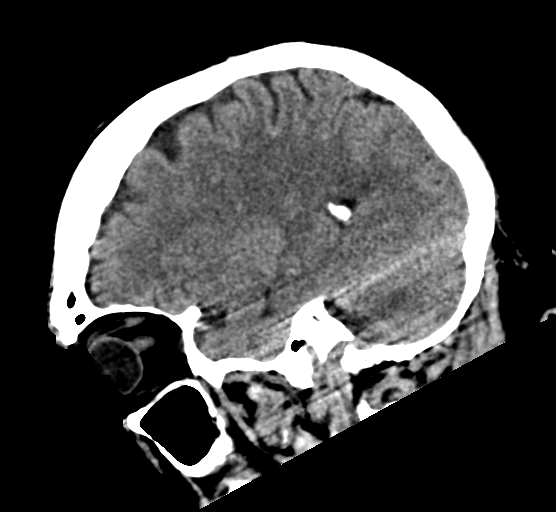
[im 39/58  brain]
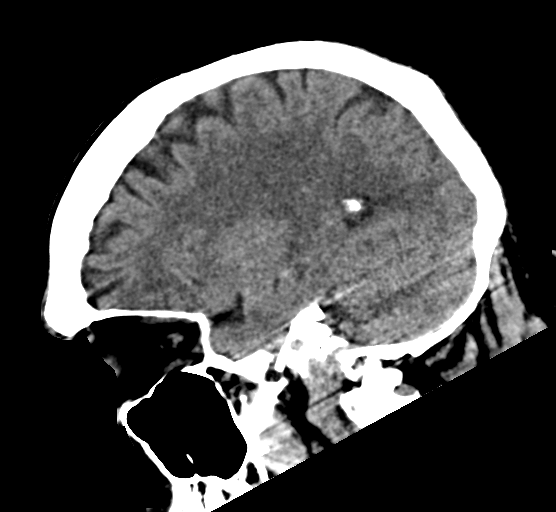

[17 of 47 positions shown; findings below may reference images not displayed]

FINDINGS: Brain: No evidence of acute infarction, hemorrhage, hydrocephalus,
extra-axial collection or mass lesion/mass effect.

Vascular: No hyperdense vessel or unexpected calcification.

Skull: Normal. Negative for fracture or focal lesion.

Sinuses/Orbits: No acute finding.

Other: These results were communicated to Dr Nn-Avto at [DATE] on
11/01/2021 by text page via the [employer] messaging system.

ASPECTS ([place] Stroke Program Early CT Score)

- Ganglionic level infarction (caudate, lentiform nuclei, internal
capsule, insula, M1-M3 cortex): 7

- Supraganglionic infarction (M4-M6 cortex): 3

Total score (0-10 with 10 being normal): 10
IMPRESSION: No acute finding.  No hemorrhage or visible infarct.
